# Patient Record
Sex: Female | Born: 1945 | Race: Black or African American | Hispanic: No | Marital: Married | Smoking: Never smoker
Health system: Southern US, Community
[De-identification: ages and names within clinical notes are randomized; demographics above are authoritative.]

## PROBLEM LIST (undated history)

## (undated) DIAGNOSIS — M4800 Spinal stenosis, site unspecified: Secondary | ICD-10-CM

## (undated) DIAGNOSIS — K449 Diaphragmatic hernia without obstruction or gangrene: Secondary | ICD-10-CM

## (undated) DIAGNOSIS — I10 Essential (primary) hypertension: Secondary | ICD-10-CM

## (undated) DIAGNOSIS — E785 Hyperlipidemia, unspecified: Secondary | ICD-10-CM

## (undated) DIAGNOSIS — K5790 Diverticulosis of intestine, part unspecified, without perforation or abscess without bleeding: Secondary | ICD-10-CM

## (undated) HISTORY — DX: Diaphragmatic hernia without obstruction or gangrene: K44.9

## (undated) HISTORY — PX: ABDOMINAL HYSTERECTOMY: SHX81

## (undated) HISTORY — DX: Essential (primary) hypertension: I10

## (undated) HISTORY — DX: Hyperlipidemia, unspecified: E78.5

## (undated) HISTORY — DX: Diverticulosis of intestine, part unspecified, without perforation or abscess without bleeding: K57.90

## (undated) HISTORY — PX: BREAST SURGERY: SHX581

## (undated) HISTORY — PX: LIPOSUCTION: SHX10

---
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
## 1992-02-27 HISTORY — PX: REDUCTION MAMMAPLASTY: SUR839

## 1992-02-27 HISTORY — PX: BREAST REDUCTION SURGERY: SHX8

## 1996-12-01 ENCOUNTER — Encounter: Payer: Self-pay | Admitting: Family Medicine

## 1997-04-05 ENCOUNTER — Ambulatory Visit (HOSPITAL_COMMUNITY): Admission: RE | Admit: 1997-04-05 | Discharge: 1997-04-05 | Payer: Self-pay | Admitting: Family Medicine

## 1998-05-30 ENCOUNTER — Encounter: Payer: Self-pay | Admitting: Family Medicine

## 1998-05-30 ENCOUNTER — Ambulatory Visit (HOSPITAL_COMMUNITY): Admission: RE | Admit: 1998-05-30 | Discharge: 1998-05-30 | Payer: Self-pay | Admitting: Family Medicine

## 1998-12-13 ENCOUNTER — Other Ambulatory Visit: Admission: RE | Admit: 1998-12-13 | Discharge: 1998-12-13 | Payer: Self-pay | Admitting: Obstetrics and Gynecology

## 1999-08-08 ENCOUNTER — Ambulatory Visit (HOSPITAL_COMMUNITY): Admission: RE | Admit: 1999-08-08 | Discharge: 1999-08-08 | Payer: Self-pay | Admitting: Family Medicine

## 1999-08-08 ENCOUNTER — Encounter: Payer: Self-pay | Admitting: Family Medicine

## 2000-01-31 ENCOUNTER — Other Ambulatory Visit: Admission: RE | Admit: 2000-01-31 | Discharge: 2000-01-31 | Payer: Self-pay | Admitting: Obstetrics and Gynecology

## 2001-02-13 ENCOUNTER — Encounter: Payer: Self-pay | Admitting: Family Medicine

## 2001-02-13 ENCOUNTER — Ambulatory Visit (HOSPITAL_COMMUNITY): Admission: RE | Admit: 2001-02-13 | Discharge: 2001-02-13 | Payer: Self-pay | Admitting: Family Medicine

## 2001-02-14 ENCOUNTER — Encounter: Payer: Self-pay | Admitting: Family Medicine

## 2001-06-19 ENCOUNTER — Other Ambulatory Visit: Admission: RE | Admit: 2001-06-19 | Discharge: 2001-06-19 | Payer: Self-pay | Admitting: Obstetrics and Gynecology

## 2001-07-04 ENCOUNTER — Encounter: Payer: Self-pay | Admitting: Family Medicine

## 2001-09-05 ENCOUNTER — Encounter: Payer: Self-pay | Admitting: Family Medicine

## 2002-02-23 ENCOUNTER — Ambulatory Visit (HOSPITAL_COMMUNITY): Admission: RE | Admit: 2002-02-23 | Discharge: 2002-02-23 | Payer: Self-pay | Admitting: Family Medicine

## 2002-02-23 ENCOUNTER — Encounter: Payer: Self-pay | Admitting: Family Medicine

## 2003-01-19 ENCOUNTER — Ambulatory Visit (HOSPITAL_COMMUNITY): Admission: RE | Admit: 2003-01-19 | Discharge: 2003-01-19 | Payer: Self-pay | Admitting: Family Medicine

## 2003-01-25 ENCOUNTER — Other Ambulatory Visit: Admission: RE | Admit: 2003-01-25 | Discharge: 2003-01-25 | Payer: Self-pay | Admitting: Obstetrics and Gynecology

## 2004-01-28 ENCOUNTER — Other Ambulatory Visit: Admission: RE | Admit: 2004-01-28 | Discharge: 2004-01-28 | Payer: Self-pay | Admitting: Obstetrics and Gynecology

## 2004-02-08 ENCOUNTER — Ambulatory Visit (HOSPITAL_COMMUNITY): Admission: RE | Admit: 2004-02-08 | Discharge: 2004-02-08 | Payer: Self-pay | Admitting: Family Medicine

## 2005-02-01 ENCOUNTER — Other Ambulatory Visit: Admission: RE | Admit: 2005-02-01 | Discharge: 2005-02-01 | Payer: Self-pay | Admitting: Obstetrics and Gynecology

## 2005-03-05 ENCOUNTER — Ambulatory Visit (HOSPITAL_COMMUNITY): Admission: RE | Admit: 2005-03-05 | Discharge: 2005-03-05 | Payer: Self-pay | Admitting: Family Medicine

## 2006-03-25 ENCOUNTER — Ambulatory Visit (HOSPITAL_COMMUNITY): Admission: RE | Admit: 2006-03-25 | Discharge: 2006-03-25 | Payer: Self-pay | Admitting: Family Medicine

## 2006-03-25 ENCOUNTER — Other Ambulatory Visit: Admission: RE | Admit: 2006-03-25 | Discharge: 2006-03-25 | Payer: Self-pay | Admitting: Obstetrics and Gynecology

## 2007-03-27 ENCOUNTER — Ambulatory Visit (HOSPITAL_COMMUNITY): Admission: RE | Admit: 2007-03-27 | Discharge: 2007-03-27 | Payer: Self-pay | Admitting: Family Medicine

## 2007-03-27 ENCOUNTER — Other Ambulatory Visit: Admission: RE | Admit: 2007-03-27 | Discharge: 2007-03-27 | Payer: Self-pay | Admitting: Gynecology

## 2007-08-01 ENCOUNTER — Encounter: Payer: Self-pay | Admitting: Family Medicine

## 2008-01-06 ENCOUNTER — Ambulatory Visit: Payer: Self-pay | Admitting: Family Medicine

## 2008-01-06 DIAGNOSIS — E785 Hyperlipidemia, unspecified: Secondary | ICD-10-CM

## 2008-01-06 DIAGNOSIS — I10 Essential (primary) hypertension: Secondary | ICD-10-CM

## 2008-01-07 LAB — CONVERTED CEMR LAB
Alkaline Phosphatase: 99 units/L (ref 39–117)
BUN: 13 mg/dL (ref 6–23)
CO2: 26 meq/L (ref 19–32)
Calcium: 9.7 mg/dL (ref 8.4–10.5)
Creatinine, Ser: 0.98 mg/dL (ref 0.40–1.20)
Glucose, Bld: 101 mg/dL — ABNORMAL HIGH (ref 70–99)
LDL Cholesterol: 118 mg/dL — ABNORMAL HIGH (ref 0–99)
Potassium: 4.6 meq/L (ref 3.5–5.3)
Sodium: 141 meq/L (ref 135–145)
Triglycerides: 131 mg/dL (ref <150)
VLDL: 26 mg/dL (ref 0–40)

## 2008-01-27 ENCOUNTER — Telehealth (INDEPENDENT_AMBULATORY_CARE_PROVIDER_SITE_OTHER): Payer: Self-pay | Admitting: *Deleted

## 2008-04-12 ENCOUNTER — Ambulatory Visit (HOSPITAL_COMMUNITY): Admission: RE | Admit: 2008-04-12 | Discharge: 2008-04-12 | Payer: Self-pay | Admitting: Family Medicine

## 2008-04-12 ENCOUNTER — Encounter: Payer: Self-pay | Admitting: Women's Health

## 2008-04-12 ENCOUNTER — Other Ambulatory Visit: Admission: RE | Admit: 2008-04-12 | Discharge: 2008-04-12 | Payer: Self-pay | Admitting: Obstetrics and Gynecology

## 2008-04-12 ENCOUNTER — Ambulatory Visit: Payer: Self-pay | Admitting: Women's Health

## 2008-04-23 ENCOUNTER — Ambulatory Visit: Payer: Self-pay | Admitting: Obstetrics and Gynecology

## 2008-06-16 ENCOUNTER — Telehealth: Payer: Self-pay | Admitting: Family Medicine

## 2008-06-21 ENCOUNTER — Ambulatory Visit: Payer: Self-pay | Admitting: Family Medicine

## 2008-07-19 ENCOUNTER — Ambulatory Visit: Payer: Self-pay | Admitting: Family Medicine

## 2008-08-19 ENCOUNTER — Ambulatory Visit: Payer: Self-pay | Admitting: Family Medicine

## 2008-08-20 LAB — CONVERTED CEMR LAB
CO2: 27 meq/L (ref 19–32)
Calcium: 9.6 mg/dL (ref 8.4–10.5)
Creatinine, Ser: 1.04 mg/dL (ref 0.40–1.20)
Glucose, Bld: 108 mg/dL — ABNORMAL HIGH (ref 70–99)

## 2008-09-10 ENCOUNTER — Ambulatory Visit: Payer: Self-pay | Admitting: Family Medicine

## 2008-11-18 ENCOUNTER — Ambulatory Visit: Payer: Self-pay | Admitting: Family Medicine

## 2008-11-25 ENCOUNTER — Encounter: Payer: Self-pay | Admitting: Family Medicine

## 2009-03-21 ENCOUNTER — Ambulatory Visit: Payer: Self-pay | Admitting: Family Medicine

## 2009-03-21 LAB — CONVERTED CEMR LAB
ALT: 17 units/L (ref 0–35)
AST: 25 units/L (ref 0–37)
Albumin: 4.8 g/dL (ref 3.5–5.2)
BUN: 15 mg/dL (ref 6–23)
CO2: 30 meq/L (ref 19–32)
Glucose, Bld: 103 mg/dL — ABNORMAL HIGH (ref 70–99)
LDL Cholesterol: 109 mg/dL — ABNORMAL HIGH (ref 0–99)
Total Protein: 7.7 g/dL (ref 6.0–8.3)
Triglycerides: 148 mg/dL (ref <150)
VLDL: 30 mg/dL (ref 0–40)

## 2009-04-13 ENCOUNTER — Other Ambulatory Visit: Admission: RE | Admit: 2009-04-13 | Discharge: 2009-04-13 | Payer: Self-pay | Admitting: Obstetrics and Gynecology

## 2009-04-13 ENCOUNTER — Ambulatory Visit: Payer: Self-pay | Admitting: Women's Health

## 2009-04-13 ENCOUNTER — Ambulatory Visit (HOSPITAL_COMMUNITY): Admission: RE | Admit: 2009-04-13 | Discharge: 2009-04-13 | Payer: Self-pay | Admitting: Family Medicine

## 2009-09-13 ENCOUNTER — Telehealth: Payer: Self-pay | Admitting: Family

## 2009-09-13 ENCOUNTER — Ambulatory Visit: Payer: Self-pay | Admitting: Family

## 2009-09-13 ENCOUNTER — Encounter: Admission: RE | Admit: 2009-09-13 | Discharge: 2009-09-13 | Payer: Self-pay | Admitting: Family Medicine

## 2009-09-13 DIAGNOSIS — M25569 Pain in unspecified knee: Secondary | ICD-10-CM

## 2009-09-29 ENCOUNTER — Encounter: Payer: Self-pay | Admitting: Family Medicine

## 2010-03-16 ENCOUNTER — Other Ambulatory Visit (HOSPITAL_COMMUNITY): Payer: Self-pay | Admitting: *Deleted

## 2010-03-16 ENCOUNTER — Other Ambulatory Visit: Payer: Self-pay | Admitting: Family Medicine

## 2010-03-16 DIAGNOSIS — Z1231 Encounter for screening mammogram for malignant neoplasm of breast: Secondary | ICD-10-CM

## 2010-03-30 NOTE — Assessment & Plan Note (Signed)
Summary: FU htn, lipids   Vital Signs:  Patient profile:   65 year old female Height:      70 inches Weight:      205 pounds Pulse rate:   90 / minute BP sitting:   128 / 72  (left arm) Cuff size:   large  Vitals Entered By: Kathlene November (March 21, 2009 8:32 AM) CC: follow-up BP, Hypertension Management   Primary Care Provider:  Nani Gasser MD  CC:  follow-up BP and Hypertension Management.  History of Present Illness: follow-up BP.   Hypertension History:      She denies headache, chest pain, palpitations, dyspnea with exertion, orthopnea, PND, peripheral edema, visual symptoms, neurologic problems, syncope, and side effects from treatment.  She notes no problems with any antihypertensive medication side effects.        Positive major cardiovascular risk factors include female age 75 years old or older, hyperlipidemia, hypertension, and family history for ischemic heart disease (males less than 38 years old).  Negative major cardiovascular risk factors include non-tobacco-user status.     Current Medications (verified): 1)  Lisinopril-Hydrochlorothiazide 20-12.5 Mg Tabs (Lisinopril-Hydrochlorothiazide) .... Take 1 Tablet By Mouth Once A Day 2)  Amlodipine Besylate 5 Mg Tabs (Amlodipine Besylate) .... Take One Tablet By Mouth Once A Day 3)  Calcium + D 600-200 Mg-Unit Tabs (Calcium Carbonate-Vitamin D) .... Take One Tablet By Mouth Once A Day 4)  Fish Oil Concentrate 1000 Mg Caps (Omega-3 Fatty Acids) .... Take One Capsule By Mouth Once A Day 5)  Vitamin D 400 Unit Caps (Cholecalciferol) .... Take One Tablet By Mouth Once A Adya 6)  Aspir-81 81 Mg Tbec (Aspirin) .... Take One Tablet By Mouth Once A D Ay  Allergies (verified): 1)  ! Pcn  Comments:  Nurse/Medical Assistant: The patient's medications and allergies were reviewed with the patient and were updated in the Medication and Allergy Lists. Kathlene November (March 21, 2009 8:32 AM)  Physical Exam  General:   Well-developed,well-nourished,in no acute distress; alert,appropriate and cooperative throughout examination Head:  Normocephalic and atraumatic without obvious abnormalities. No apparent alopecia or balding. Eyes:  No corneal or conjunctival inflammation noted. EOMI. Perrla. Funduscopic exam benign, without hemorrhages, exudates or papilledema. Vision grossly normal. Lungs:  Normal respiratory effort, chest expands symmetrically. Lungs are clear to auscultation, no crackles or wheezes. Heart:  Normal rate and regular rhythm. S1 and S2 normal without gallop, murmur, click, rub or other extra sounds. Skin:  no rashes.   Cervical Nodes:  No lymphadenopathy noted Psych:  Cognition and judgment appear intact. Alert and cooperative with normal attention span and concentration. No apparent delusions, illusions, hallucinations   Impression & Recommendations:  Problem # 1:  HYPERTENSION, BENIGN (ICD-401.1) Looks great. FU in 6 mo.  Her updated medication list for this problem includes:    Lisinopril-hydrochlorothiazide 20-12.5 Mg Tabs (Lisinopril-hydrochlorothiazide) .Marland Kitchen... Take 1 tablet by mouth once a day    Amlodipine Besylate 5 Mg Tabs (Amlodipine besylate) .Marland Kitchen... Take one tablet by mouth once a day  Orders: T-Comprehensive Metabolic Panel ([id number])  BP today: 128/72 Prior BP: 132/75 (11/18/2008)  Prior 10 Yr Risk Heart Disease: 9 % (08/19/2008)  Labs Reviewed: K+: 4.1 (08/19/2008) Creat: : 1.04 (08/19/2008)   Chol: 196 (01/06/2008)   HDL: 52 (01/06/2008)   LDL: 118 (01/06/2008)   TG: 131 (01/06/2008)  Problem # 2:  HYPERLIPIDEMIA (ICD-272.4) Due to recheck today.   Orders: T-Comprehensive Metabolic Panel [phone]) T-Lipid Profile [phone])  Complete Medication List: 1)  Lisinopril-hydrochlorothiazide 20-12.5 Mg Tabs (Lisinopril-hydrochlorothiazide) .... Take 1 tablet by mouth once a day 2)  Amlodipine Besylate 5 Mg Tabs (Amlodipine besylate) .... Take one tablet by mouth  once a day 3)  Calcium + D 600-200 Mg-unit Tabs (Calcium carbonate-vitamin d) .... Take one tablet by mouth once a day 4)  Fish Oil Concentrate 1000 Mg Caps (Omega-3 fatty acids) .... Take one capsule by mouth once a day 5)  Vitamin D 400 Unit Caps (Cholecalciferol) .... Take one tablet by mouth once a adya 6)  Aspir-81 81 Mg Tbec (Aspirin) .... Take one tablet by mouth once a d ay  Hypertension Assessment/Plan:      The patient's hypertensive risk group is category B: At least one risk factor (excluding diabetes) with no target organ damage.  Her calculated 10 year risk of coronary heart disease is 9 %.  Today's blood pressure is 128/72.  Her blood pressure goal is < 140/90.  Contraindications/Deferment of Procedures/Staging:    Test/Procedure: FLU VAX    Reason for deferment: patient declined

## 2010-03-30 NOTE — Progress Notes (Signed)
  Phone Note Outgoing Call   Summary of Call: Please call patient and let her know that her knee x-ray shows a small effusion (fluid) and arthritis.  I will arrange referral to orthopedics for further evaluation. Initial call taken by: Lemont Fillers FNP,  September 13, 2009 5:08 PM  Follow-up for Phone Call        [facility] for pt to rerturn call Follow-up by: Kathlene November,  September 14, 2009 8:11 AM  Additional Follow-up for Phone Call Additional follow up Details #1::        Pt informed of results and instructed a referral will be done Additional Follow-up by: Kathlene November,  September 14, 2009 12:02 PM

## 2010-03-30 NOTE — Consult Note (Signed)
Summary: [facility]  [facility]   Imported By: Lanelle Bal 10/11/2009 13:50:20  _____________________________________________________________________  External Attachment:    Type:   Image     Comment:   External Document

## 2010-03-30 NOTE — Assessment & Plan Note (Signed)
Summary: Rt. leg hurts - jr   Vital Signs:  Patient profile:   65 year old female Height:      70 inches Weight:      211 pounds Pulse rate:   86 / minute BP sitting:   136 / 74  (left arm) Cuff size:   regular  Vitals Entered By: Kathlene November (September 13, 2009 2:44 PM) CC: right posterior knee pain x few months   Primary Care Provider:  Nani Gasser MD  CC:  right posterior knee pain x few months.  History of Present Illness: Ms Planck is a 65 year old female who presents with a several month history of pain behind her left knee.  Worse with walking down steps.  Occasionally takes aleve with minimal improvement.  Denies fevers.  Denies known history of arthritis or injury to the left knee.  Denies shortness of breath, chest pain, or pain in the right calf.    Current Medications (verified): 1)  Lisinopril-Hydrochlorothiazide 20-12.5 Mg Tabs (Lisinopril-Hydrochlorothiazide) .... Take 1 Tablet By Mouth Once A Day 2)  Amlodipine Besylate 5 Mg Tabs (Amlodipine Besylate) .... Take One Tablet By Mouth Once A Day 3)  Calcium + D 600-200 Mg-Unit Tabs (Calcium Carbonate-Vitamin D) .... Take One Tablet By Mouth Once A Day 4)  Fish Oil Concentrate 1000 Mg Caps (Omega-3 Fatty Acids) .... Take One Capsule By Mouth Once A Day 5)  Vitamin D 400 Unit Caps (Cholecalciferol) .... Take One Tablet By Mouth Once A Adya 6)  Aspir-81 81 Mg Tbec (Aspirin) .... Take One Tablet By Mouth Once A D Ay  Allergies (verified): 1)  ! Pcn  Comments:  Nurse/Medical Assistant: The patient's medications and allergies were reviewed with the patient and were updated in the Medication and Allergy Lists. Kathlene November (September 13, 2009 2:45 PM)  Past History:  Past Medical History: Last updated: 08/19/2008 HTN since before 2000 Cholesterol.  Hiatal Hernia, GERD Diverticulosis Exercise Tolerance Test  06/2001. minimally abnormal Prior use of HRT Shingles 1993  Has gyn who does pap and mammogram  Past  Surgical History: Last updated: 01/06/2008 Complete Hysterectomy 1976 Breast Reduction 1994  Physical Exam  General:  Well-developed,well-nourished,in no acute distress; alert,appropriate and cooperative throughout examination Msk:  + tenderness to palpation behind right knee.  Unable to fully extend right knee.  No knee swelling noted.   Impression & Recommendations:  Problem # 1:  KNEE PAIN, RIGHT (ICD-719.46) Assessment New Knee x-ray performed today noted +OA and small joint effusion.  Will refer to orthopedics for further evaluation.  In the meantime, patient was instructed to use ibuprofen as needed. Her updated medication list for this problem includes:    Aspir-81 81 Mg Tbec (Aspirin) .Marland Kitchen... Take one tablet by mouth once a d ay  Orders: T-DG Knee Complete 4 Views*R* ([id number])  Complete Medication List: 1)  Lisinopril-hydrochlorothiazide 20-12.5 Mg Tabs (Lisinopril-hydrochlorothiazide) .... Take 1 tablet by mouth once a day 2)  Amlodipine Besylate 5 Mg Tabs (Amlodipine besylate) .... Take one tablet by mouth once a day 3)  Calcium + D 600-200 Mg-unit Tabs (Calcium carbonate-vitamin d) .... Take one tablet by mouth once a day 4)  Fish Oil Concentrate 1000 Mg Caps (Omega-3 fatty acids) .... Take one capsule by mouth once a day 5)  Vitamin D 400 Unit Caps (Cholecalciferol) .... Take one tablet by mouth once a adya 6)  Aspir-81 81 Mg Tbec (Aspirin) .... Take one tablet by mouth once a d ay  Patient Instructions:  1)  Please complete your knee x-ray downstairs. 2)  Follow up with Dr. Linford Arnold in 1 month.

## 2010-04-14 ENCOUNTER — Ambulatory Visit (HOSPITAL_COMMUNITY)
Admission: RE | Admit: 2010-04-14 | Discharge: 2010-04-14 | Disposition: A | Discharge status: Home / Self Care | Payer: 59 | Source: Ambulatory Visit | Attending: Family Medicine | Admitting: Family Medicine

## 2010-04-14 ENCOUNTER — Encounter (HOSPITAL_COMMUNITY): Payer: Self-pay

## 2010-04-14 ENCOUNTER — Other Ambulatory Visit (HOSPITAL_COMMUNITY)
Admission: RE | Admit: 2010-04-14 | Discharge: 2010-04-14 | Disposition: A | Discharge status: Home / Self Care | Payer: 59 | Source: Ambulatory Visit | Attending: Obstetrics and Gynecology | Admitting: Obstetrics and Gynecology

## 2010-04-14 ENCOUNTER — Encounter (INDEPENDENT_AMBULATORY_CARE_PROVIDER_SITE_OTHER): Payer: 59 | Admitting: Women's Health

## 2010-04-14 ENCOUNTER — Other Ambulatory Visit: Payer: Self-pay | Admitting: Women's Health

## 2010-04-14 DIAGNOSIS — Z1231 Encounter for screening mammogram for malignant neoplasm of breast: Secondary | ICD-10-CM | POA: Insufficient documentation

## 2010-04-14 DIAGNOSIS — Z01419 Encounter for gynecological examination (general) (routine) without abnormal findings: Secondary | ICD-10-CM

## 2010-04-14 DIAGNOSIS — Z124 Encounter for screening for malignant neoplasm of cervix: Secondary | ICD-10-CM | POA: Insufficient documentation

## 2010-04-25 ENCOUNTER — Encounter (INDEPENDENT_AMBULATORY_CARE_PROVIDER_SITE_OTHER): Payer: 59

## 2010-04-25 DIAGNOSIS — Z13820 Encounter for screening for osteoporosis: Secondary | ICD-10-CM

## 2010-09-13 ENCOUNTER — Other Ambulatory Visit: Payer: Self-pay | Admitting: Family Medicine

## 2010-10-13 ENCOUNTER — Other Ambulatory Visit: Payer: Self-pay | Admitting: Family Medicine

## 2011-01-07 ENCOUNTER — Other Ambulatory Visit: Payer: Self-pay | Admitting: Family Medicine

## 2011-01-13 ENCOUNTER — Other Ambulatory Visit: Payer: Self-pay | Admitting: Family Medicine

## 2011-01-15 NOTE — Telephone Encounter (Signed)
Needs appointment

## 2011-01-22 ENCOUNTER — Encounter: Payer: Self-pay | Admitting: Family Medicine

## 2011-01-22 ENCOUNTER — Ambulatory Visit (INDEPENDENT_AMBULATORY_CARE_PROVIDER_SITE_OTHER): Payer: 59 | Admitting: Family Medicine

## 2011-01-22 VITALS — BP 127/66 | HR 78 | Wt 213.0 lb

## 2011-01-22 DIAGNOSIS — R252 Cramp and spasm: Secondary | ICD-10-CM

## 2011-01-22 DIAGNOSIS — I10 Essential (primary) hypertension: Secondary | ICD-10-CM

## 2011-01-22 MED ORDER — LISINOPRIL-HYDROCHLOROTHIAZIDE 20-12.5 MG PO TABS: 1.0000 | ORAL_TABLET | Freq: Every day | ORAL | Status: DC

## 2011-01-22 MED ORDER — AMLODIPINE BESYLATE 5 MG PO TABS: 5.0000 mg | ORAL_TABLET | Freq: Every day | ORAL | Status: DC

## 2011-01-22 NOTE — Patient Instructions (Signed)
Schedule your physical anytime.  Make sure to fast for 8 hours before your labwork.

## 2011-01-22 NOTE — Progress Notes (Signed)
  Subjective:    Patient ID: [name], female    DOB: 1945-07-16, 65 y.o.   MRN: [id number]  Hypertension This is a chronic problem. The current episode started more than 1 year ago. The problem is unchanged. The problem is controlled. Pertinent negatives include no chest pain, palpitations or shortness of breath. Past treatments include calcium channel blockers, ACE inhibitors and diuretics. The current treatment provides moderate improvement. Compliance problems include exercise and diet.     Review of Systems  Respiratory: Negative for shortness of breath.   Cardiovascular: Negative for chest pain and palpitations.       Objective:   Physical Exam  Constitutional: She is oriented to person, place, and time. She appears well-developed and well-nourished.  HENT:  Head: Normocephalic and atraumatic.  Cardiovascular: Normal rate, regular rhythm and normal heart sounds.        2/ 6 SEM   Pulmonary/Chest: Effort normal and breath sounds normal.  Neurological: She is alert and oriented to person, place, and time.  Skin: Skin is warm and dry.  Psychiatric: She has a normal mood and affect. Her behavior is normal.          Assessment & Plan:  HTN- Encouraged her to really work on exercise and diet.  BP is well controlled.  F/U in 6 mo. Due for CMP and lipids. Lab slip given.   Leg cramps - also c/o leg cramping.. Hasn't had labs in over a year. Start otc magnesium and helped some. Sometimes will cramp so bad she is sore the next day. Rec check electrolytes, esp with the meds she takes.

## 2011-01-23 LAB — LIPID PANEL
Total CHOL/HDL Ratio: 4.2 Ratio
Triglycerides: 211 mg/dL — ABNORMAL HIGH (ref <150)

## 2011-01-23 MED ORDER — AMLODIPINE BESYLATE 5 MG PO TABS: 5.0000 mg | ORAL_TABLET | Freq: Every day | ORAL | Status: DC

## 2011-01-23 MED ORDER — LISINOPRIL-HYDROCHLOROTHIAZIDE 20-12.5 MG PO TABS: 1.0000 | ORAL_TABLET | Freq: Every day | ORAL | Status: DC

## 2011-01-23 NOTE — Progress Notes (Signed)
Addended by: Ellsworth Lennox on: 01/23/2011 04:47 PM   Modules accepted: Orders

## 2011-01-24 ENCOUNTER — Telehealth: Payer: Self-pay | Admitting: Family Medicine

## 2011-01-24 LAB — COMPLETE METABOLIC PANEL WITH GFR
AST: 31 U/L (ref 0–37)
Alkaline Phosphatase: 83 U/L (ref 39–117)
BUN: 18 mg/dL (ref 6–23)
CO2: 29 mEq/L (ref 19–32)
Calcium: 9.5 mg/dL (ref 8.4–10.5)
Creat: 1.02 mg/dL (ref 0.50–1.10)
Glucose, Bld: 100 mg/dL — ABNORMAL HIGH (ref 70–99)
Sodium: 141 mEq/L (ref 135–145)
Total Bilirubin: 0.5 mg/dL (ref 0.3–1.2)

## 2011-01-24 NOTE — Telephone Encounter (Signed)
Called rx in  

## 2011-01-24 NOTE — Telephone Encounter (Signed)
Patient's pharmacy called advised per pt's insurance she needs 90 day supply of amlodipine and linsopril

## 2011-01-25 ENCOUNTER — Other Ambulatory Visit: Payer: Self-pay | Admitting: *Deleted

## 2011-01-26 ENCOUNTER — Emergency Department
Admit: 2011-01-26 | Discharge: 2011-01-26 | Disposition: A | Discharge status: Home / Self Care | Payer: 59 | Attending: Emergency Medicine | Admitting: Emergency Medicine

## 2011-01-26 ENCOUNTER — Emergency Department
Admission: EM | Admit: 2011-01-26 | Discharge: 2011-01-26 | Disposition: A | Discharge status: Home / Self Care | Payer: 59 | Source: Home / Self Care | Attending: Emergency Medicine | Admitting: Emergency Medicine

## 2011-01-26 ENCOUNTER — Encounter: Payer: Self-pay | Admitting: *Deleted

## 2011-01-26 DIAGNOSIS — M79609 Pain in unspecified limb: Secondary | ICD-10-CM

## 2011-01-26 DIAGNOSIS — S93409A Sprain of unspecified ligament of unspecified ankle, initial encounter: Secondary | ICD-10-CM

## 2011-01-26 DIAGNOSIS — M79671 Pain in right foot: Secondary | ICD-10-CM

## 2011-01-26 NOTE — ED Notes (Signed)
Patient c/o right foot pain on top of her foot. Unknown cause of pain.

## 2011-01-26 NOTE — ED Provider Notes (Signed)
History     CSN: [id number] Arrival date & time: 01/26/2011 11:32 AM   First MD Initiated Contact with Patient 01/26/11 1152      Chief Complaint  Patient presents with  . Foot Pain    top of right    (Consider location/radiation/quality/duration/timing/severity/associated sxs/prior treatment) HPI This patient comes in today with right foot pain for the last few weeks. She does not recall any kind of trauma, tripping, or twisting her ankle. She knows that the pain is mostly on the lateral aspect of the foot. She thinks that it feels a little bit swollen or that there is a bump there. She has not been eating much medicine, but when she does she takes Aleve which does help. No previous injuries or fractures on that foot. The pain is described as throbbing and intermittent and has an unknown cause.  Past Medical History  Diagnosis Date  . Hypertension   . Hyperlipidemia   . GERD (gastroesophageal reflux disease)   . Hiatal hernia   . Diverticulosis     Past Surgical History  Procedure Date  . Complete hysterectomy 1976  . Breast reduction surgery 1994    Family History  Problem Relation Age of Onset  . Breast cancer Sister   . Heart attack Father     History  Substance Use Topics  . Smoking status: Never Smoker   . Smokeless tobacco: Not on file  . Alcohol Use: No    OB History    Grav Para Term Preterm Abortions TAB SAB Ect Mult Living                  Review of Systems  Allergies  Penicillins  Home Medications   Current Outpatient Rx  Name Route Sig Dispense Refill  . AMLODIPINE BESYLATE 5 MG PO TABS Oral Take 1 tablet (5 mg total) by mouth daily. 90 tablet 1  . AMLODIPINE BESYLATE 5 MG PO TABS Oral Take 1 tablet (5 mg total) by mouth daily. 30 tablet 0  . ASPIRIN 81 MG PO TABS Oral Take 81 mg by mouth daily.      Marland Kitchen CALCIUM CARBONATE-VITAMIN D 600-200 MG-UNIT PO TABS Oral Take by mouth.      . CHOLECALCIFEROL 400 UNITS PO TABS Oral Take by mouth.        Marland Kitchen LISINOPRIL-HYDROCHLOROTHIAZIDE 20-12.5 MG PO TABS Oral Take 1 tablet by mouth daily. 90 tablet 1  . LISINOPRIL-HYDROCHLOROTHIAZIDE 20-12.5 MG PO TABS Oral Take 1 tablet by mouth daily. 30 tablet 0  . FISH OIL 1000 MG PO CPDR Oral Take by mouth.        BP 112/67  Pulse 82  Temp(Src) 98.1 F (36.7 C) (Oral)  Resp 14  Ht 5' 10.5" (1.791 m)  Wt 211 lb (95.709 kg)  BMI 29.85 kg/m2  SpO2 100%  Physical Exam  Nursing note and vitals reviewed. Constitutional: She is oriented to person, place, and time. She appears well-developed and well-nourished.  HENT:  Head: Normocephalic and atraumatic.  Neck: Neck supple.  Cardiovascular: Regular rhythm and normal heart sounds.   Pulmonary/Chest: Effort normal and breath sounds normal. No respiratory distress.  Musculoskeletal:       R ankle/foot: FROM, +TTP, base of fifth, ATFL.   No TTP medial malleolus, navicular, calcaneus, Achilles, proximal fibula.  No swelling.  No ecchymoses.  Distal neurovascular status is intact.   Neurological: She is alert and oriented to person, place, and time.  Skin: Skin is warm and dry.  Psychiatric: She has a normal mood and affect. Her speech is normal.    ED Course  Procedures (including critical care time)  Labs Reviewed - No data to display Dg Foot Complete Right  01/26/2011  *RADIOLOGY REPORT*  Clinical Data: Right foot pain.  RIGHT FOOT COMPLETE - 3+ VIEW  Comparison: None.  Findings: There is a hallux valgus deformity of the right foot. Soft tissue swelling is noted medial to the first MTP joint. Calcaneal spurring is noted.  No acute bone or soft tissue abnormality is present.  IMPRESSION:  1.  Hallux valgus deformity. 2.  Calcaneal spurring. 3.  No acute abnormality.  Original Report Authenticated By: Jamesetta Orleans. MATTERN, M.D.     1. Right foot pain   2. Ankle sprain       MDM   An X-ray was ordered and read by the radiologist as written above.   Encourage rest, ice, compression with  ACE bandage and/or a brace, and elevation of injured body part.  If she is not improving by next week, I would like to refer her to sports medicine.       Lily Kocher, MD 01/26/11 1225

## 2011-01-30 ENCOUNTER — Ambulatory Visit (INDEPENDENT_AMBULATORY_CARE_PROVIDER_SITE_OTHER): Payer: 59 | Admitting: Family Medicine

## 2011-01-30 ENCOUNTER — Encounter: Payer: Self-pay | Admitting: Family Medicine

## 2011-01-30 VITALS — BP 137/74 | HR 77 | Wt 213.0 lb

## 2011-01-30 DIAGNOSIS — Z23 Encounter for immunization: Secondary | ICD-10-CM

## 2011-01-30 DIAGNOSIS — R252 Cramp and spasm: Secondary | ICD-10-CM

## 2011-01-30 DIAGNOSIS — E785 Hyperlipidemia, unspecified: Secondary | ICD-10-CM

## 2011-01-30 MED ORDER — AMBULATORY NON FORMULARY MEDICATION: Status: DC

## 2011-01-30 NOTE — Progress Notes (Signed)
  Subjective:    Patient ID: [name], female    DOB: June 20, 1945, 65 y.o.   MRN: [id number]  HPI Leg cramping -  We went over her labs today. She hasn't had any more cramping since i saw her. She did start a walking program last week. She really doesn't know making some lifestyle changes. She is here to go over her lab results.   Review of Systems     Objective:   Physical Exam  Constitutional: She is oriented to person, place, and time. She appears well-developed and well-nourished.  HENT:  Head: Normocephalic and atraumatic.  Cardiovascular: Normal rate, regular rhythm and normal heart sounds.   Pulmonary/Chest: Effort normal and breath sounds normal.  Neurological: She is alert and oriented to person, place, and time.  Skin: Skin is warm and dry.  Psychiatric: She has a normal mood and affect. Her behavior is normal.          Assessment & Plan:  Hyperlipidemia - Work on diet and exercise, weight loss and get levels back to previous levels. She has gained weight.  She plans on working on this. We reviewed a low cholesterol diet.   Leg cramping. Discussed medication for cramping. She can call if she would like to strt this. Since hasn't had any new cramping will hold off.   unforntunately we had discussed pnuemonia vaccine and flu and she agreed but we forget to give them before she left. Will will admin today.

## 2011-02-16 ENCOUNTER — Telehealth: Payer: Self-pay | Admitting: *Deleted

## 2011-02-16 NOTE — Telephone Encounter (Signed)
Pt called and states she woke up with a rash on her neck that itches. No new perfumes,detergents,ect.Advised pt to try otc cortisone cream and take claritin reditabs that dissolve to see if that helps with rash and itching.Pt agreed and voiced understanding

## 2011-03-05 ENCOUNTER — Other Ambulatory Visit: Payer: Self-pay | Admitting: Family Medicine

## 2011-03-05 DIAGNOSIS — Z1231 Encounter for screening mammogram for malignant neoplasm of breast: Secondary | ICD-10-CM

## 2011-04-16 ENCOUNTER — Ambulatory Visit (HOSPITAL_COMMUNITY)
Admission: RE | Admit: 2011-04-16 | Discharge: 2011-04-16 | Disposition: A | Discharge status: Home / Self Care | Payer: 59 | Source: Ambulatory Visit | Attending: Family Medicine | Admitting: Family Medicine

## 2011-04-16 ENCOUNTER — Encounter: Payer: Self-pay | Admitting: Women's Health

## 2011-04-16 ENCOUNTER — Ambulatory Visit (INDEPENDENT_AMBULATORY_CARE_PROVIDER_SITE_OTHER): Payer: 59 | Admitting: Women's Health

## 2011-04-16 VITALS — BP 138/86 | Ht 70.0 in | Wt 215.0 lb

## 2011-04-16 DIAGNOSIS — Z780 Asymptomatic menopausal state: Secondary | ICD-10-CM

## 2011-04-16 DIAGNOSIS — Z1231 Encounter for screening mammogram for malignant neoplasm of breast: Secondary | ICD-10-CM | POA: Insufficient documentation

## 2011-04-16 NOTE — Progress Notes (Signed)
[name] [birth year] [id number]    History:    The patient presents for breast and pelvic exam. History of a TAH for fibroids, no ERT, without complaint. History of normal mammograms and Pap smears. Normal DEXA in 2009. We'll repeat study this year.  Past medical history, past surgical history, family history and social history were all reviewed and documented in the EPIC chart. Normal colonoscopy in 2003, is aware she is due for a repeat study this year. Has had the Pneumovax vaccine and will check with primary care if she has received zostovac and Dtap. 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   ROS:  A  ROS was performed and pertinent positives and negatives are included in the history.  Exam:  Filed Vitals:   04/16/11 0906  BP: 138/86    General appearance:  Normal Head/Neck:  Normal, without cervical or supraclavicular adenopathy. Thyroid:  Symmetrical, normal in size, without palpable masses or nodularity. Respiratory  Effort:  Normal  Auscultation:  Clear without wheezing or rhonchi Cardiovascular  Auscultation:  Regular rate, without rubs, murmurs or gallops  Edema/varicosities:  Not grossly evident Abdominal  Soft,nontender, without masses, guarding or rebound.  Liver/spleen:  No organomegaly noted  Hernia:  None appreciated  Skin  Inspection:  Grossly normal  Palpation:  Grossly normal Neurologic/psychiatric  Orientation:  Normal with appropriate conversation.  Mood/affect:  Normal  Genitourinary    Breasts: Examined lying and sitting.     Right: Without masses, retractions, discharge or axillary adenopathy.     Left: Without masses, retractions, discharge or axillary adenopathy.   Inguinal/mons:  Normal without inguinal adenopathy  External genitalia:  Normal  BUS/Urethra/Skene's glands:  Normal  Bladder:  Normal  Vagina:  +1 rectocele/asymptomatic   Cervix:  Absent  Uterus:    Adnexa/parametria:     Rt: Without masses or tenderness.   Lt: Without masses or tenderness.  Anus and perineum: Normal  Digital rectal exam: Normal sphincter tone without palpated masses or tenderness  Assessment/Plan:  66 y.o. MBF G0 for breast and pelvic exam.   Normal postmenopausal exam Asymptomatic +1 rectocele Hypertension-primary care labs and meds.  Plan: Continue SBE's and annual  mammogram, Mammogram scheduled today. Reviewed importance of cutting calories increasing exercise for weight loss. Has had approximately 25 pound weight gain in the past year, she relates to situational stressors. Vitamin D 1000 daily, calcium rich diet, increasingly short activities encouraged. Scheduled Dexa.  Continue care with primary care and schedule a colonoscopy this year.    [name], [name] [facility], 9:44 AM 04/16/2011

## 2011-05-01 ENCOUNTER — Other Ambulatory Visit: Payer: Self-pay | Admitting: Obstetrics and Gynecology

## 2011-05-01 DIAGNOSIS — Z13820 Encounter for screening for osteoporosis: Secondary | ICD-10-CM

## 2011-05-02 ENCOUNTER — Ambulatory Visit (INDEPENDENT_AMBULATORY_CARE_PROVIDER_SITE_OTHER): Payer: 59

## 2011-05-02 DIAGNOSIS — Z13820 Encounter for screening for osteoporosis: Secondary | ICD-10-CM

## 2011-07-24 ENCOUNTER — Ambulatory Visit (INDEPENDENT_AMBULATORY_CARE_PROVIDER_SITE_OTHER): Payer: 59 | Admitting: Family Medicine

## 2011-07-24 ENCOUNTER — Encounter: Payer: Self-pay | Admitting: Family Medicine

## 2011-07-24 VITALS — BP 150/73 | HR 93 | Ht 70.0 in | Wt 215.0 lb

## 2011-07-24 DIAGNOSIS — L259 Unspecified contact dermatitis, unspecified cause: Secondary | ICD-10-CM

## 2011-07-24 MED ORDER — PREDNISONE (PAK) 10 MG PO TABS: 10.0000 mg | ORAL_TABLET | Freq: Every day | ORAL | Status: AC

## 2011-07-24 MED ORDER — SODIUM CHLORIDE 0.9 % IV SOLN: 125.0000 mg | Freq: Once | INTRAVENOUS | Status: DC

## 2011-07-24 MED ORDER — RANITIDINE HCL 150 MG PO TABS: 150.0000 mg | ORAL_TABLET | Freq: Two times a day (BID) | ORAL | Status: DC

## 2011-07-24 MED ORDER — LORATADINE 10 MG PO TABS: 10.0000 mg | ORAL_TABLET | Freq: Every day | ORAL | Status: DC

## 2011-07-24 NOTE — Patient Instructions (Signed)

## 2011-07-24 NOTE — Progress Notes (Signed)
  Subjective:    Patient ID: [name], female    DOB: December 04, 1945, 66 y.o.   MRN: [id number]  Rash This is a new problem. The current episode started in the past 7 days (Rash started 4 days go). The problem has been gradually worsening since onset. The affected locations include the right arm, left arm and neck. The rash is characterized by burning, itchiness and redness. She was exposed to nothing. Associated symptoms include eye pain. Pertinent negatives include no anorexia, congestion, cough, diarrhea, facial edema, fatigue, fever, joint pain, nail changes, rhinorrhea, shortness of breath, sore throat or vomiting. Past treatments include antihistamine and topical steroids. The treatment provided mild relief. Her past medical history is significant for allergies and varicella. There is no history of asthma or eczema.   Review of Systems  Constitutional: Negative for fever and fatigue.  HENT: Negative for congestion, sore throat and rhinorrhea.   Eyes: Positive for pain.  Respiratory: Negative for cough and shortness of breath.   Gastrointestinal: Negative for vomiting, diarrhea and anorexia.  Musculoskeletal: Negative for joint pain.  Skin: Positive for rash. Negative for nail changes.  Neurological: Negative for dizziness and facial asymmetry.  All other systems reviewed and are negative.       BP 150/73  Pulse 93  Ht 5\' 10"  (1.778 m)  Wt 215 lb (97.523 kg)  BMI 30.85 kg/m2  SpO2 100% Objective:   Physical Exam  Constitutional: She is oriented to person, place, and time. She appears well-developed and well-nourished.  Pulmonary/Chest: She has no wheezes.  Neurological: She is alert and oriented to person, place, and time.  Skin: Rash noted. There is erythema.       Rash is present on both arms and his marked hyperemia as well on both arms and is similar rash on the neck and upper part of chest as well.  Psychiatric: She has a normal mood and affect. Her behavior is normal.      Assessment & Plan:  contact dermatitis. The etiology is not clear but I question if it was oil that may have been present on her husband's clothes since he is the one who works in the yard and outside. We will give her a shot of Solu-Medrol 125 in the office , prednisone dosepak taper over the next 12 days warned about stopping prematurely and having rebound symptoms. Instead of OTC Benadryl which could cause sedation I will recommend Claritin 10 mg daily and Zantac 150 mg twice a day. Return if not better in 2 weeks.   Problem 2 blood pressure mildly elevated but maybe because of the pruritus. We'll not make any changes at this time.

## 2011-07-27 ENCOUNTER — Other Ambulatory Visit: Payer: Self-pay | Admitting: Family Medicine

## 2011-07-30 ENCOUNTER — Other Ambulatory Visit: Payer: Self-pay | Admitting: Family Medicine

## 2011-08-02 ENCOUNTER — Ambulatory Visit: Payer: 59 | Admitting: Family Medicine

## 2011-10-01 ENCOUNTER — Encounter: Payer: Self-pay | Admitting: Family Medicine

## 2011-10-01 ENCOUNTER — Ambulatory Visit (INDEPENDENT_AMBULATORY_CARE_PROVIDER_SITE_OTHER): Payer: 59 | Admitting: Family Medicine

## 2011-10-01 VITALS — BP 130/84 | HR 80 | Wt 218.0 lb

## 2011-10-01 DIAGNOSIS — Z1331 Encounter for screening for depression: Secondary | ICD-10-CM

## 2011-10-01 DIAGNOSIS — D509 Iron deficiency anemia, unspecified: Secondary | ICD-10-CM | POA: Diagnosis not present

## 2011-10-01 DIAGNOSIS — Z9181 History of falling: Secondary | ICD-10-CM | POA: Diagnosis not present

## 2011-10-01 DIAGNOSIS — I10 Essential (primary) hypertension: Secondary | ICD-10-CM

## 2011-10-01 DIAGNOSIS — E669 Obesity, unspecified: Secondary | ICD-10-CM

## 2011-10-01 MED ORDER — AMBULATORY NON FORMULARY MEDICATION: Status: DC

## 2011-10-01 NOTE — Progress Notes (Addendum)
  Subjective:    Patient ID: Ian Bushman, female    DOB: [birth year], 66 y.o.   MRN: [id number]  HPI HTN - Has been able to exercise.  Says has been a really busy year. Says plans on joining weight watchers.  No CP or SOB.     Review of Systems     Objective:   Physical Exam  Constitutional: She is oriented to person, place, and time. She appears well-developed and well-nourished.  HENT:  Head: Normocephalic and atraumatic.  Cardiovascular: Normal rate, regular rhythm and normal heart sounds.        No carotid bruits.   Pulmonary/Chest: Effort normal and breath sounds normal.  Neurological: She is alert and oriented to person, place, and time.  Skin: Skin is warm and dry.  Psychiatric: She has a normal mood and affect. Her behavior is normal.          Assessment & Plan:  HTN - Rechexck BP is well controlled. Due for screening labs. F/U in 6 months.    Obesity - work on weight loss. She says she plans on starting Weight Watchers be fantastic. She has gained 6 pounds in the last year. We'll recheck in 6 months.  Iron deficiency-she says the last time she tried to give blood about a month ago she was told that she could and was told to start an iron supplement. She has been taking this over-the-counter. She would like to have her lab work rechecked. We will check her ferritin and CBC.  Fall assessment-score of 3, low risk for falls.  Depression screen-PHQ 9 score of 2, negative for depression.

## 2011-10-02 ENCOUNTER — Other Ambulatory Visit: Payer: Self-pay | Admitting: Family Medicine

## 2011-10-02 LAB — TSH: TSH: 1.76 u[IU]/mL (ref 0.350–4.500)

## 2011-10-02 LAB — COMPLETE METABOLIC PANEL WITH GFR
AST: 25 U/L (ref 0–37)
Albumin: 4.5 g/dL (ref 3.5–5.2)
Alkaline Phosphatase: 74 U/L (ref 39–117)
BUN: 25 mg/dL — ABNORMAL HIGH (ref 6–23)
Creat: 0.97 mg/dL (ref 0.50–1.10)
GFR, Est Non African American: 61 mL/min
Glucose, Bld: 103 mg/dL — ABNORMAL HIGH (ref 70–99)
Potassium: 3.8 mEq/L (ref 3.5–5.3)
Total Bilirubin: 0.5 mg/dL (ref 0.3–1.2)

## 2011-10-02 LAB — LIPID PANEL
Cholesterol: 199 mg/dL (ref 0–200)
HDL: 42 mg/dL (ref >39)
Total CHOL/HDL Ratio: 4.7 Ratio
VLDL: 38 mg/dL (ref 0–40)

## 2011-10-02 LAB — CBC
HCT: 35.4 % — ABNORMAL LOW (ref 36.0–46.0)
Hemoglobin: 11 g/dL — ABNORMAL LOW (ref 12.0–15.0)
MCHC: 31.1 g/dL (ref 30.0–36.0)
RBC: 4.23 MIL/uL (ref 3.87–5.11)
WBC: 3.9 10*3/uL — ABNORMAL LOW (ref 4.0–10.5)

## 2011-10-02 LAB — FERRITIN: Ferritin: 143 ng/mL (ref 10–291)

## 2011-10-02 LAB — VITAMIN B12: Vitamin B-12: 1244 pg/mL — ABNORMAL HIGH (ref 211–911)

## 2011-10-02 LAB — FOLATE: Folate: >20 ng/mL

## 2011-10-02 NOTE — Addendum Note (Signed)
Addended by: Ellsworth Lennox on: 10/02/2011 11:23 AM   Modules accepted: Orders

## 2011-10-09 ENCOUNTER — Encounter: Payer: Self-pay | Admitting: Gastroenterology

## 2011-11-05 ENCOUNTER — Encounter: Payer: Self-pay | Admitting: Gastroenterology

## 2011-11-22 ENCOUNTER — Ambulatory Visit (AMBULATORY_SURGERY_CENTER): Payer: 59 | Admitting: *Deleted

## 2011-11-22 VITALS — Ht 70.0 in | Wt 218.2 lb

## 2011-11-22 DIAGNOSIS — Z1211 Encounter for screening for malignant neoplasm of colon: Secondary | ICD-10-CM

## 2011-11-22 MED ORDER — MOVIPREP 100 G PO SOLR: 1.0000 | Freq: Once | ORAL | Status: DC

## 2011-12-06 ENCOUNTER — Ambulatory Visit (AMBULATORY_SURGERY_CENTER): Payer: 59 | Admitting: Gastroenterology

## 2011-12-06 ENCOUNTER — Encounter: Payer: Self-pay | Admitting: Gastroenterology

## 2011-12-06 VITALS — BP 126/66 | HR 64 | Temp 97.1°F | Resp 16 | Ht 70.0 in | Wt 218.0 lb

## 2011-12-06 DIAGNOSIS — K5730 Diverticulosis of large intestine without perforation or abscess without bleeding: Secondary | ICD-10-CM

## 2011-12-06 DIAGNOSIS — Z1211 Encounter for screening for malignant neoplasm of colon: Secondary | ICD-10-CM | POA: Diagnosis not present

## 2011-12-06 MED ORDER — SODIUM CHLORIDE 0.9 % IV SOLN: 500.0000 mL | INTRAVENOUS | Status: DC

## 2011-12-06 NOTE — Op Note (Signed)
[facility] [address] [place], [address]   COLONOSCOPY PROCEDURE REPORT  PATIENT: [name], [name]  MR#: [id number] BIRTHDATE: [birth year] , 66  yrs. old GENDER: Female ENDOSCOPIST: Mardella Layman, MD, Clementeen Graham REFERRED BY:  Nani Gasser, M.D. PROCEDURE DATE:  12/06/2011 PROCEDURE:   Colonoscopy, surveillance ASA CLASS:   Class II INDICATIONS:average risk patient for colon cancer. MEDICATIONS: propofol (Diprivan) 150mg  IV  DESCRIPTION OF PROCEDURE:   After the risks and benefits and of the procedure were explained, informed consent was obtained.  A digital rectal exam revealed no abnormalities of the rectum.    The LB [id number]  endoscope was introduced through the anus and advanced to the cecum, which was identified by both the appendix and ileocecal valve .  The quality of the prep was excellent, using MoviPrep .  The instrument was then slowly withdrawn as the colon was fully examined.     COLON FINDINGS: Moderate diverticulosis was noted throughout the entire examined colon.   The colon was otherwise normal.  There was no diverticulosis, inflammation, polyps or cancers unless previously stated.     Retroflexed views revealed no abnormalities. The scope was then withdrawn from the patient and the procedure completed.  COMPLICATIONS: There were no complications. ENDOSCOPIC IMPRESSION: 1.   Moderate diverticulosis was noted throughout the entire examined colon 2.   The colon was otherwise normal  RECOMMENDATIONS: 1.  High fiber diet 2.  Continue current colorectal screening recommendations for "routine risk" patients with a repeat colonoscopy in 10 years.   REPEAT EXAM:  cc:  _______________________________ eSignedMardella Layman, MD, [facility] 12/06/2011 8:48 AM

## 2011-12-06 NOTE — Patient Instructions (Signed)
YOU HAD AN ENDOSCOPIC PROCEDURE TODAY AT THE [facility]: Refer to the procedure report that was given to you for any specific questions about what was found during the examination.  If the procedure report does not answer your questions, please call your gastroenterologist to clarify.  If you requested that your care partner not be given the details of your procedure findings, then the procedure report has been included in a sealed envelope for you to review at your convenience later.  YOU SHOULD EXPECT: Some feelings of bloating in the abdomen. Passage of more gas than usual.  Walking can help get rid of the air that was put into your GI tract during the procedure and reduce the bloating. If you had a lower endoscopy (such as a colonoscopy or flexible sigmoidoscopy) you may notice spotting of blood in your stool or on the toilet paper. If you underwent a bowel prep for your procedure, then you may not have a normal bowel movement for a few days.  DIET: Your first meal following the procedure should be a light meal and then it is ok to progress to your normal diet.  A half-sandwich or bowl of soup is an example of a good first meal.  Heavy or fried foods are harder to digest and may make you feel nauseous or bloated.  Likewise meals heavy in dairy and vegetables can cause extra gas to form and this can also increase the bloating.  Drink plenty of fluids but you should avoid alcoholic beverages for 24 hours.  ACTIVITY: Your care partner should take you home directly after the procedure.  You should plan to take it easy, moving slowly for the rest of the day.  You can resume normal activity the day after the procedure however you should NOT DRIVE or use heavy machinery for 24 hours (because of the sedation medicines used during the test).    SYMPTOMS TO REPORT IMMEDIATELY: A gastroenterologist can be reached at any hour.  During normal business hours, 8:30 AM to 5:00 PM Monday through Friday,  call [phone].  After hours and on weekends, please call the GI answering service at [phone] who will take a message and have the physician on call contact you.   Following lower endoscopy (colonoscopy or flexible sigmoidoscopy):  Excessive amounts of blood in the stool  Significant tenderness or worsening of abdominal pains  Swelling of the abdomen that is new, acute  Fever of 100F or higher    FOLLOW UP: If any biopsies were taken you will be contacted by phone or by letter within the next 1-3 weeks.  Call your gastroenterologist if you have not heard about the biopsies in 3 weeks.  Our staff will call the home number listed on your records the next business day following your procedure to check on you and address any questions or concerns that you may have at that time regarding the information given to you following your procedure. This is a courtesy call and so if there is no answer at the home number and we have not heard from you through the emergency physician on call, we will assume that you have returned to your regular daily activities without incident.  SIGNATURES/CONFIDENTIALITY: You and/or your care partner have signed paperwork which will be entered into your electronic medical record.  These signatures attest to the fact that that the information above on your After Visit Summary has been reviewed and is understood.  Full responsibility of the confidentiality   of this discharge information lies with you and/or your care-partner.     Information on diverticulosis & high fiber diet given to you today 

## 2011-12-06 NOTE — Progress Notes (Signed)
Propofol per [facility] crna, see scanned intra procedure report. ewm 

## 2011-12-06 NOTE — Progress Notes (Signed)
Patient did not experience any of the following events: a burn prior to discharge; a fall within the facility; wrong site/side/patient/procedure/implant event; or a hospital transfer or hospital admission upon discharge from the facility. (G8907) Patient did not have preoperative order for IV antibiotic SSI prophylaxis. (G8918)  

## 2011-12-07 ENCOUNTER — Telehealth: Payer: Self-pay | Admitting: *Deleted

## 2011-12-07 NOTE — Telephone Encounter (Signed)
  Follow up Call-  Call back number 12/06/2011  Post procedure Call Back phone  # [phone]  Permission to leave phone message Yes     Patient questions:  Do you have a fever, pain , or abdominal swelling? no Pain Score  0 *  Have you tolerated food without any problems? yes  Have you been able to return to your normal activities? yes  Do you have any questions about your discharge instructions: Diet   no Medications  no Follow up visit  no  Do you have questions or concerns about your Care? no  Actions: * If pain score is 4 or above: No action needed, pain <4.

## 2012-01-24 ENCOUNTER — Other Ambulatory Visit: Payer: Self-pay | Admitting: Family Medicine

## 2012-01-28 ENCOUNTER — Other Ambulatory Visit: Payer: Self-pay | Admitting: Family Medicine

## 2012-02-14 ENCOUNTER — Ambulatory Visit (INDEPENDENT_AMBULATORY_CARE_PROVIDER_SITE_OTHER): Payer: 59 | Admitting: Family Medicine

## 2012-02-14 ENCOUNTER — Encounter: Payer: Self-pay | Admitting: Family Medicine

## 2012-02-14 VITALS — BP 139/81 | HR 70 | Ht 70.0 in | Wt 209.0 lb

## 2012-02-14 DIAGNOSIS — E785 Hyperlipidemia, unspecified: Secondary | ICD-10-CM | POA: Diagnosis not present

## 2012-02-14 DIAGNOSIS — I10 Essential (primary) hypertension: Secondary | ICD-10-CM

## 2012-02-14 DIAGNOSIS — Z0000 Encounter for general adult medical examination without abnormal findings: Secondary | ICD-10-CM | POA: Diagnosis not present

## 2012-02-14 LAB — COMPLETE METABOLIC PANEL WITHOUT GFR
ALT: 19 U/L (ref 0–35)
AST: 25 U/L (ref 0–37)
Albumin: 4.8 g/dL (ref 3.5–5.2)
Alkaline Phosphatase: 81 U/L (ref 39–117)
BUN: 18 mg/dL (ref 6–23)
CO2: 29 meq/L (ref 19–32)
Calcium: 9.5 mg/dL (ref 8.4–10.5)
Chloride: 99 meq/L (ref 96–112)
Creat: 1.12 mg/dL — ABNORMAL HIGH (ref 0.50–1.10)
GFR, Est African American: 59 mL/min — ABNORMAL LOW
GFR, Est Non African American: 51 mL/min — ABNORMAL LOW
Glucose, Bld: 96 mg/dL (ref 70–99)
Potassium: 3.9 meq/L (ref 3.5–5.3)
Sodium: 137 meq/L (ref 135–145)
Total Bilirubin: 0.5 mg/dL (ref 0.3–1.2)
Total Protein: 7.3 g/dL (ref 6.0–8.3)

## 2012-02-14 LAB — LIPID PANEL
Cholesterol: 178 mg/dL (ref 0–200)
HDL: 38 mg/dL — ABNORMAL LOW
LDL Cholesterol: 115 mg/dL — ABNORMAL HIGH (ref 0–99)
Total CHOL/HDL Ratio: 4.7 ratio
Triglycerides: 125 mg/dL
VLDL: 25 mg/dL (ref 0–40)

## 2012-02-14 MED ORDER — AMBULATORY NON FORMULARY MEDICATION: Status: DC

## 2012-02-14 NOTE — Patient Instructions (Addendum)
He can schedule the removal of a sebaceous cyst on her back at any time. We will call you with your lab results. If you don't here from [place] in about a week then please give [place] a call at [phone]. Keep up a regular exercise program and make sure you are eating a healthy diet Try to eat 4 servings of dairy a day, or if you are lactose intolerant take a calcium with vitamin D daily.  Your vaccines are up to date.

## 2012-02-14 NOTE — Progress Notes (Signed)
Subjective:    [name] is a 66 y.o. female who presents for Medicare Initial preventive examination.  Preventive Screening-Counseling & Management  Tobacco History  Smoking status  . Never Smoker   Smokeless tobacco  . Never Used     Problems Prior to Visit 1. None. No complaints today. She does have a cyst on her back that she wants me to look at. Has been lanced previously several years ago by dermatology. Still occasionally expresses a material it has over and she just wants me to examine it. No pain or active discharge today.  Current Problems (verified) Patient Active Problem List  Diagnosis  . HYPERLIPIDEMIA  . HYPERTENSION, BENIGN  . KNEE PAIN, RIGHT    Medications Prior to Visit Current Outpatient Prescriptions on File Prior to Visit  Medication Sig Dispense Refill  . amLODipine (NORVASC) 5 MG tablet TAKE 1 TABLET BY MOUTH EVERY DAY  90 tablet  1  . aspirin 81 MG tablet Take 81 mg by mouth daily.        . Calcium Carbonate-Vitamin D (CALCIUM 600+D) 600-200 MG-UNIT TABS Take by mouth.        . cholecalciferol (VITAMIN D) 400 UNITS TABS Take by mouth.        . Echinacea 450 MG CAPS Take 1 capsule by mouth daily.      . Ginkgo Biloba (GNP GINGKO BILOBA EXTRACT PO) Take by mouth.      Marland Kitchen lisinopril-hydrochlorothiazide (PRINZIDE,ZESTORETIC) 20-12.5 MG per tablet TAKE 1 TABLET BY MOUTH EVERY DAY  90 tablet  1  . MAGNESIUM PO Take by mouth.      . Multiple Vitamins-Minerals (MULTIVITAMIN PO) Take by mouth.      . Omega-3 Fatty Acids (FISH OIL) 1000 MG CPDR Take by mouth.          Current Medications (verified) Current Outpatient Prescriptions  Medication Sig Dispense Refill  . amLODipine (NORVASC) 5 MG tablet TAKE 1 TABLET BY MOUTH EVERY DAY  90 tablet  1  . aspirin 81 MG tablet Take 81 mg by mouth daily.        . Calcium Carbonate-Vitamin D (CALCIUM 600+D) 600-200 MG-UNIT TABS Take by mouth.        . cholecalciferol (VITAMIN D) 400 UNITS TABS Take by mouth.         . Echinacea 450 MG CAPS Take 1 capsule by mouth daily.      . Ginkgo Biloba (GNP GINGKO BILOBA EXTRACT PO) Take by mouth.      Marland Kitchen lisinopril-hydrochlorothiazide (PRINZIDE,ZESTORETIC) 20-12.5 MG per tablet TAKE 1 TABLET BY MOUTH EVERY DAY  90 tablet  1  . MAGNESIUM PO Take by mouth.      . Multiple Vitamins-Minerals (MULTIVITAMIN PO) Take by mouth.      . Omega-3 Fatty Acids (FISH OIL) 1000 MG CPDR Take by mouth.        . Vitamin D, Ergocalciferol, (DRISDOL) 50000 UNITS CAPS Take 50,000 Units by mouth daily.      . AMBULATORY NON FORMULARY MEDICATION Medication Name: zostavax IM x 1  1 vial  0     Allergies (verified) Penicillins   PAST HISTORY  Family History Family History  Problem Relation Age of Onset  . Breast cancer Sister   . Hypertension Sister   . Heart attack Father   . Hypertension Father   . Hypertension Mother   . Kidney disease Mother   . Hypertension Brother   . Colon cancer Neg Hx   . Esophageal cancer  Neg Hx   . Rectal cancer Neg Hx   . Stomach cancer Neg Hx     Social History History  Substance Use Topics  . Smoking status: Never Smoker   . Smokeless tobacco: Never Used  . Alcohol Use: No     Are there smokers in your home (other than you)? Yes, husband  Risk Factors Current exercise habits: occ exercise. Has been walking  Dietary issues discussed: none   Cardiac risk factors: advanced age (older than 86 for men, 60 for women), dyslipidemia, hypertension and obesity (BMI >= 30 kg/m2).  Depression Screen (Note: if answer to either of the following is "Yes", a more complete depression screening is indicated)   Over the past 2 weeks, have you felt down, depressed or hopeless? No  Over the past 2 weeks, have you felt little interest or pleasure in doing things? No  Have you lost interest or pleasure in daily life? No  Do you often feel hopeless? No  Do you cry easily over simple problems? No  Activities of Daily Living In your present state of  health, do you have any difficulty performing the following activities?:  Driving? No Managing money?  No Feeding yourself? No Getting from bed to chair? No Climbing a flight of stairs? No Preparing food and eating?: No Bathing or showering? No Getting dressed: No Getting to the toilet? No Using the toilet:No Moving around from place to place: No In the past year have you fallen or had a near fall?:No   Are you sexually active?  Yes  Do you have more than one partner?  No  Hearing Difficulties: No Do you often ask people to speak up or repeat themselves? No Do you experience ringing or noises in your ears? No Do you have difficulty understanding soft or whispered voices? No   Do you feel that you have a problem with memory? No  Do you often misplace items? No  Do you feel safe at home?  Yes  Cognitive Testing  Alert? Yes  Normal Appearance?Yes  Oriented to person? Yes  Place? Yes   Time? Yes  Recall of three objects?  Yes  Can perform simple calculations? Yes  Displays appropriate judgment?Yes  Can read the correct time from a watch face?Yes   Advanced Directives have been discussed with the patient? Not asked.   List the Names of Other Physician/Practitioners you currently use: 1.    Indicate any recent Medical Services you may have received from other than [facility] providers in the past year (date may be approximate).  Immunization History  Administered Date(s) Administered  . Influenza Split 01/30/2011  . Pneumococcal Polysaccharide 01/30/2011  . Td 02/27/1995, 02/27/2007    Screening Tests Health Maintenance  Topic Date Due  . Zostavax  06/23/2005  . Influenza Vaccine  10/28/2011  . Mammogram  04/15/2013  . Tetanus/tdap  02/26/2017  . Colonoscopy  12/05/2021  . Pneumococcal Polysaccharide Vaccine Age 39 And Over  Completed    All answers were reviewed with the patient and necessary referrals were made:  METHENEY,CATHERINE, MD   02/14/2012   History reviewed:  allergies, current medications, past family history, past medical history, past social history, past surgical history and problem list  Review of Systems A comprehensive review of systems was negative.    Objective:     Vision by Snellen chart: right HYQ:MVHQION declines measurement, left GEX:BMWUXLK declines measurement She just had eye exam a week ago with optimetrist.    Body mass  index is 29.99 kg/(m^2). BP 139/81  Pulse 70  Ht 5\' 10"  (1.778 m)  Wt 209 lb (94.802 kg)  BMI 29.99 kg/m2  BP 139/81  Pulse 70  Ht 5\' 10"  (1.778 m)  Wt 209 lb (94.802 kg)  BMI 29.99 kg/m2  General Appearance:    Alert, cooperative, no distress, appears stated age  Head:    Normocephalic, without obvious abnormality, atraumatic  Eyes:    PERRL, conjunctiva/corneas clear, EOM's intact, benign, both eyes  Ears:    Normal TM's and external ear canals, both ears  Nose:   Nares normal, septum midline, mucosa normal, no drainage    or sinus tenderness  Throat:   Lips, mucosa, and tongue normal; teeth and gums normal  Neck:   Supple, symmetrical, trachea midline, no adenopathy;    thyroid:  no enlargement/tenderness/nodules; no carotid   bruit or JVD  Back:     Symmetric, no curvature, ROM normal, no CVA tenderness  Lungs:     Clear to auscultation bilaterally, respirations unlabored  Chest Wall:    No tenderness or deformity   Heart:    Regular rate and rhythm, S1 and S2 normal, no murmur, rub   or gallop  Breast Exam:    No tenderness, masses, or nipple abnormality, scars well healed from breast reduction surgery  Abdomen:     Soft, non-tender, bowel sounds active all four quadrants,    no masses, no organomegaly  Genitalia:    Not peformed  Rectal:    Not performed   Extremities:   Extremities normal, atraumatic, no cyanosis or edema  Pulses:   2+ and symmetric all extremities  Skin:   Skin color, texture, turgor normal, no rashes or lesions  Lymph nodes:   Cervical, supraclavicular, and axillary  nodes normal  Neurologic:   CNII-XII intact, normal strength, sensation and reflexes    throughout       Assessment:     Annual Wellness Exam, Medicare     Plan:     During the course of the visit the patient was educated and counseled about appropriate screening and preventive services including:    shingles vaccine  Recommend f/u for removal of sebaceous cyst on her back, can schedule at any time.   Had full eye exam with her optometrist, Dr. Jackquline Bosch, last week.  Mammogram is up-to-date was performed in February and she has not had any problems or new symptoms.  Colonoscopy is up-to-date.  All other vaccines are up-to-date  Due for CMP and fasting lipid panel today.  Diet review for nutrition referral? Yes ____  Not Indicated _x___   Patient Instructions (the written plan) was given to the patient.  Medicare Attestation I have personally reviewed: The patient's medical and social history Their use of alcohol, tobacco or illicit drugs Their current medications and supplements The patient's functional ability including ADLs,fall risks, home safety risks, cognitive, and hearing and visual impairment Diet and physical activities Evidence for depression or mood disorders  The patient's weight, height, BMI, and visual acuity have been recorded in the chart.  I have made referrals, counseling, and provided education to the patient based on review of the above and I have provided the patient with a written personalized care plan for preventive services.     METHENEY,CATHERINE, MD   02/14/2012

## 2012-03-24 ENCOUNTER — Other Ambulatory Visit: Payer: Self-pay | Admitting: Family Medicine

## 2012-03-24 DIAGNOSIS — Z1231 Encounter for screening mammogram for malignant neoplasm of breast: Secondary | ICD-10-CM

## 2012-03-31 ENCOUNTER — Encounter: Payer: 59 | Admitting: Family Medicine

## 2012-04-16 ENCOUNTER — Encounter: Payer: Self-pay | Admitting: Women's Health

## 2012-04-16 ENCOUNTER — Ambulatory Visit (HOSPITAL_COMMUNITY)
Admission: RE | Admit: 2012-04-16 | Discharge: 2012-04-16 | Disposition: A | Discharge status: Home / Self Care | Payer: 59 | Source: Ambulatory Visit | Attending: Family Medicine | Admitting: Family Medicine

## 2012-04-16 ENCOUNTER — Ambulatory Visit (INDEPENDENT_AMBULATORY_CARE_PROVIDER_SITE_OTHER): Payer: 59 | Admitting: Women's Health

## 2012-04-16 ENCOUNTER — Encounter: Payer: Medicare Other | Admitting: Women's Health

## 2012-04-16 VITALS — BP 118/76 | Ht 69.5 in | Wt 202.0 lb

## 2012-04-16 DIAGNOSIS — Z01419 Encounter for gynecological examination (general) (routine) without abnormal findings: Secondary | ICD-10-CM | POA: Diagnosis not present

## 2012-04-16 DIAGNOSIS — Z1231 Encounter for screening mammogram for malignant neoplasm of breast: Secondary | ICD-10-CM | POA: Insufficient documentation

## 2012-04-16 DIAGNOSIS — I10 Essential (primary) hypertension: Secondary | ICD-10-CM

## 2012-04-16 NOTE — Progress Notes (Signed)
[name] 10-06-1945 [id number]    History:    The patient presents for Breast and pelvic exam. Normal bone density 2013 T score 3.8 at spine, bilateral hip average 1.7. TAH for fibroids. Hypertension, hypercholesterolemia managed by primary care. Normal colonoscopy 2013. History of normal Paps and mammograms.   Past medical history, past surgical history, family history and social history were all reviewed and documented in the EPIC chart.   ROS:  A  ROS was performed and pertinent positives and negatives are included in the history.  Exam:  Filed Vitals:   04/16/12 0946  BP: 118/76    General appearance:  Normal Head/Neck:  Normal, without cervical or supraclavicular adenopathy. Thyroid:  Symmetrical, normal in size, without palpable masses or nodularity. Respiratory  Effort:  Normal  Auscultation:  Clear without wheezing or rhonchi Cardiovascular  Auscultation:  Regular rate, without rubs, murmurs or gallops  Edema/varicosities:  Not grossly evident Abdominal  Soft,nontender, without masses, guarding or rebound.  Liver/spleen:  No organomegaly noted  Hernia:  None appreciated  Skin  Inspection:  Grossly normal  Palpation:  Grossly normal Neurologic/psychiatric  Orientation:  Normal with appropriate conversation.  Mood/affect:  Normal  Genitourinary    Breasts: Examined lying and sitting.     Right: Without masses, retractions, discharge or axillary adenopathy.     Left: Without masses, retractions, discharge or axillary adenopathy.   Inguinal/mons:  Normal without inguinal adenopathy  External genitalia:  Normal  BUS/Urethra/Skene's glands:  Normal  Bladder:  Normal  Vagina:  Normal  Cervix:  absent  Uterus:  absent  Adnexa/parametria:     Rt: Without masses or tenderness.   Lt: Without masses or tenderness.  Anus and perineum: Normal  Digital rectal exam: Normal sphincter tone without palpated masses or tenderness  Assessment/Plan:  67 y.o. MBF G0 for  breast and pelvic exam with no complaints.  Normal postmenopausal exam no ERT Hypertension primary care labs and meds Normal DEXA 04/2011 Negative colonoscopy 2013  Plan: SBE's, continue annual mammogram, scheduled today, increase regular exercise, decrease calories for weight loss. Vitamin D 2000 daily encouraged, home safety and fall prevention discussed. Pap screening guidelines reviewed.      [name], [name] [facility], 6:05 PM 04/16/2012

## 2012-04-16 NOTE — Patient Instructions (Addendum)

## 2012-06-16 ENCOUNTER — Encounter: Payer: Self-pay | Admitting: Physician Assistant

## 2012-06-16 ENCOUNTER — Ambulatory Visit (INDEPENDENT_AMBULATORY_CARE_PROVIDER_SITE_OTHER): Payer: 59 | Admitting: Physician Assistant

## 2012-06-16 VITALS — BP 142/78 | HR 76 | Wt 213.0 lb

## 2012-06-16 DIAGNOSIS — J329 Chronic sinusitis, unspecified: Secondary | ICD-10-CM | POA: Diagnosis not present

## 2012-06-16 DIAGNOSIS — J3489 Other specified disorders of nose and nasal sinuses: Secondary | ICD-10-CM | POA: Diagnosis not present

## 2012-06-16 DIAGNOSIS — J309 Allergic rhinitis, unspecified: Secondary | ICD-10-CM

## 2012-06-16 DIAGNOSIS — R05 Cough: Secondary | ICD-10-CM

## 2012-06-16 DIAGNOSIS — J302 Other seasonal allergic rhinitis: Secondary | ICD-10-CM

## 2012-06-16 DIAGNOSIS — R0981 Nasal congestion: Secondary | ICD-10-CM

## 2012-06-16 MED ORDER — AZITHROMYCIN 250 MG PO TABS: ORAL_TABLET | ORAL | Status: DC

## 2012-06-16 MED ORDER — HYDROCODONE-HOMATROPINE 5-1.5 MG/5ML PO SYRP: 5.0000 mL | ORAL_SOLUTION | Freq: Every evening | ORAL | Status: DC | PRN

## 2012-06-16 MED ORDER — FLUTICASONE PROPIONATE 50 MCG/ACT NA SUSP: 2.0000 | Freq: Every day | NASAL | Status: DC

## 2012-06-16 NOTE — Patient Instructions (Addendum)
Continue Zyrtec daily.  Nasal spray to use once a day 2 sprays.  Decongestant(Sudafed) 3-4 days.  Cough syrup to use at night.  Daily Vitamin  Nettie pots/sinuses rinses .    Then if still not improving can consider antibiotic.

## 2012-06-16 NOTE — Progress Notes (Signed)
  Subjective:    Patient ID: [name], female    DOB: 1945-04-28, 67 y.o.   MRN: [id number]  HPI Patient presents to the clinic with 2 weeks of nasal congestion, throat tickle, dry cough, watery eyes. She started zyrtec and thought it was helping. Nasal congestion is worse today as well as sinus pressure. Denies any wheezing, SOB, fever, n/v/d. Cough is worse at night. Though she might be getting better and then seems to start back up.      Review of Systems     Objective:   Physical Exam  Constitutional: She is oriented to person, place, and time. She appears well-developed and well-nourished.  HENT:  Head: Normocephalic and atraumatic.  Right Ear: External ear normal.  PND oropharynx.   Bilateral turbinates and red and swollen.   Eyes: Conjunctivae are normal.   Bilateral conjunctiva injected.   Neck: Normal range of motion. Neck supple.  Cardiovascular: Normal rate, regular rhythm and normal heart sounds.   Pulmonary/Chest: Effort normal and breath sounds normal. She has no wheezes.  Lymphadenopathy:    She has no cervical adenopathy.  Neurological: She is alert and oriented to person, place, and time.  Skin: Skin is warm and dry.  Psychiatric: She has a normal mood and affect. Her behavior is normal.          Assessment & Plan:  Seasonal allergies/nasal conjestion- Seems typical of allergies. Continue Zyrtec daily.  Nasal spray to use once a day 2 sprays.  Decongestant(Sudafed) 3-4 days.  Cough syrup to use at night.  Daily Vitamin  Nettie pots/sinuses rinses .    Then if still not improving can consider antibiotic.

## 2012-07-24 ENCOUNTER — Other Ambulatory Visit: Payer: Self-pay | Admitting: Family Medicine

## 2012-07-24 ENCOUNTER — Ambulatory Visit (INDEPENDENT_AMBULATORY_CARE_PROVIDER_SITE_OTHER): Payer: 59 | Admitting: Family Medicine

## 2012-07-24 ENCOUNTER — Telehealth: Payer: Self-pay | Admitting: *Deleted

## 2012-07-24 ENCOUNTER — Encounter: Payer: Self-pay | Admitting: Family Medicine

## 2012-07-24 VITALS — BP 120/66 | HR 74 | Wt 210.0 lb

## 2012-07-24 DIAGNOSIS — R1031 Right lower quadrant pain: Secondary | ICD-10-CM | POA: Diagnosis not present

## 2012-07-24 DIAGNOSIS — M79604 Pain in right leg: Secondary | ICD-10-CM

## 2012-07-24 DIAGNOSIS — M79609 Pain in unspecified limb: Secondary | ICD-10-CM

## 2012-07-24 LAB — COMPLETE METABOLIC PANEL WITH GFR
ALT: 19 U/L (ref 0–35)
AST: 23 U/L (ref 0–37)
Albumin: 4.5 g/dL (ref 3.5–5.2)
Alkaline Phosphatase: 69 U/L (ref 39–117)
Calcium: 9.6 mg/dL (ref 8.4–10.5)
Chloride: 98 mEq/L (ref 96–112)
Potassium: 4.3 mEq/L (ref 3.5–5.3)
Sodium: 137 mEq/L (ref 135–145)
Total Protein: 7.3 g/dL (ref 6.0–8.3)

## 2012-07-24 LAB — CBC WITH DIFFERENTIAL/PLATELET
Basophils Relative: 1 % (ref 0–1)
Eosinophils Relative: 3 % (ref 0–5)
HCT: 34.6 % — ABNORMAL LOW (ref 36.0–46.0)
Hemoglobin: 11.3 g/dL — ABNORMAL LOW (ref 12.0–15.0)
MCH: 26.8 pg (ref 26.0–34.0)
MCHC: 32.7 g/dL (ref 30.0–36.0)
MCV: 82 fL (ref 78.0–100.0)
Monocytes Absolute: 0.4 10*3/uL (ref 0.1–1.0)
Monocytes Relative: 8 % (ref 3–12)
Neutro Abs: 1.6 10*3/uL — ABNORMAL LOW (ref 1.7–7.7)
RDW: 14.7 % (ref 11.5–15.5)

## 2012-07-24 NOTE — Progress Notes (Signed)
Subjective:    Patient ID: [name], female    DOB: 08-30-1945, 67 y.o.   MRN: [id number]  HPI Right side abdominal pain started about a month ago. Feels achey.  Now radiating down her right leg. No fever.  EAting or BMs don't affect.  Says Aleve helps some but not completely.  Some numbness in the right foot.  No swelling.  No bloating.  No nausea.  Normal appetitie. No chills or sweats. 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  Hx of hysterectomy but not sure if has her ovaries.  No change in stools. No blood in stool. Some low back pain for several months.     Review of Systems BP 120/66  Pulse 74  Wt 210 lb (95.255 kg)  BMI 30.58 kg/m2    Allergies  Allergen Reactions  . Penicillins Rash    REACTION: rash    Past Medical History  Diagnosis Date  . Hypertension   . Hyperlipidemia     borderline  . Hiatal hernia   . Diverticulosis     Past Surgical History  Procedure Laterality Date  . Complete hysterectomy  1976  . Breast reduction surgery  1994    History   Social History  . Marital Status: Married    Spouse Name: N/A    Number of Children: 0  . Years of Education: N/A   Occupational History  . Not on file.   Social History Main Topics  . Smoking status: Never Smoker   . Smokeless tobacco: Never Used  . Alcohol Use: No  . Drug Use: No  . Sexually Active: No   Other Topics Concern  . Not on file   Social History Narrative   Working out some.     Family History  Problem Relation Age of Onset  . Breast cancer Sister 64    COD breast cancer and pneumonia  . Hypertension Sister   . Heart attack Father   . Hypertension Father   . Hypertension Mother   . Kidney disease Mother   . Hypertension Brother   . Colon cancer Neg Hx   . Esophageal cancer Neg Hx   . Rectal cancer Neg Hx   . Stomach cancer Neg Hx     Outpatient Encounter Prescriptions as of 07/24/2012  Medication Sig Dispense Refill  . amLODipine  (NORVASC) 5 MG tablet TAKE 1 TABLET BY MOUTH EVERY DAY  90 tablet  1  . aspirin 81 MG tablet Take 81 mg by mouth daily.        . Calcium Carbonate-Vitamin D (CALCIUM 600+D) 600-200 MG-UNIT TABS Take by mouth.        . cholecalciferol (VITAMIN D) 400 UNITS TABS Take by mouth.        . Echinacea 450 MG CAPS Take 1 capsule by mouth daily.      . fluticasone (FLONASE) 50 MCG/ACT nasal spray Place 2 sprays into the nose daily.  16 g  2  . Ginkgo Biloba (GNP GINGKO BILOBA EXTRACT PO) Take by mouth.      Marland Kitchen lisinopril-hydrochlorothiazide (PRINZIDE,ZESTORETIC) 20-12.5 MG per tablet TAKE 1 TABLET BY MOUTH EVERY DAY  90 tablet  1  . Multiple Vitamins-Minerals (MULTIVITAMIN PO) Take by mouth.      . Omega-3 Fatty Acids (FISH OIL) 1000 MG CPDR Take by mouth.        . Probiotic Product (PROBIOTIC DAILY PO) Take by mouth.      . [  DISCONTINUED] azithromycin (ZITHROMAX) 250 MG tablet 2 tabs first day and then 1 tab for 4 days.  6 each  0  . [DISCONTINUED] HYDROcodone-homatropine (HYCODAN) 5-1.5 MG/5ML syrup Take 5 mLs by mouth at bedtime as needed for cough.  120 mL  0   No facility-administered encounter medications on file as of 07/24/2012.          Objective:   Physical Exam  Constitutional: She is oriented to person, place, and time. She appears well-developed and well-nourished.  HENT:  Head: Normocephalic and atraumatic.  Cardiovascular: Normal rate, regular rhythm and normal heart sounds.   Pulmonary/Chest: Effort normal and breath sounds normal.  Abdominal: Soft. Bowel sounds are normal. She exhibits no distension and no mass. There is tenderness. There is no rebound and no guarding.  Mild tenderness in the lower RLQ. Not tender over teh hip  Has pain int eh abdomen with rotation of the right hip. Hip, knee, ankle strength is 5/5.  Musculoskeletal:  Low back is nontender on exam with NROM. Neg straight leg raise bilat.    Neurological: She is alert and oriented to person, place, and time.   Skin: Skin is warm and dry.  Psychiatric: She has a normal mood and affect. Her behavior is normal.          Assessment & Plan:  RLQ pain -unclear etiology at this point. She's not even sure she has her ovaries or not. She's not sure they're removed when she had her hysterectomy. I would like to start with a CT scan of the abdomen and pelvis to evaluate further specialist that has been persistent for months without any change in bowel movements. Also consider a low-grade appendicitis as a possible cause. She is only mildly tender on exam which is very reassuring. Also consider that this could be coming from the right hip and they really what she is pointing is just above her hip joint. If she gets a fever, vomiting or sudden onset of pain that is more severe than usual but I encouraged her to [facility] department if it's overnight her for the weekend.  Right leg pain-this could be certainly radiating from the abdomen that would be a little bit unusual. Could be also radiating from the right hip as well as the low back. She has had some low-grade low back pain for quite some time. She did not mention it has bothersome at first. She is nontender over the lumbar spine or paraspinous muscles. CT of the abdomen will pick up some findings of the lumbar spine so we can certainly take a better look at this if needed.

## 2012-07-24 NOTE — Telephone Encounter (Signed)
PA obtained for abd/pelvis w cm. # is [id number].  Carolyn at [employer] notified.

## 2012-07-25 ENCOUNTER — Ambulatory Visit (HOSPITAL_BASED_OUTPATIENT_CLINIC_OR_DEPARTMENT_OTHER)
Admission: RE | Admit: 2012-07-25 | Discharge: 2012-07-25 | Disposition: A | Discharge status: Home / Self Care | Payer: 59 | Source: Ambulatory Visit | Attending: Family Medicine | Admitting: Family Medicine

## 2012-07-25 ENCOUNTER — Other Ambulatory Visit: Payer: Self-pay | Admitting: Family Medicine

## 2012-07-25 DIAGNOSIS — M5137 Other intervertebral disc degeneration, lumbosacral region: Secondary | ICD-10-CM | POA: Diagnosis not present

## 2012-07-25 DIAGNOSIS — K7689 Other specified diseases of liver: Secondary | ICD-10-CM | POA: Insufficient documentation

## 2012-07-25 DIAGNOSIS — M51379 Other intervertebral disc degeneration, lumbosacral region without mention of lumbar back pain or lower extremity pain: Secondary | ICD-10-CM | POA: Insufficient documentation

## 2012-07-25 DIAGNOSIS — Q619 Cystic kidney disease, unspecified: Secondary | ICD-10-CM | POA: Insufficient documentation

## 2012-07-25 DIAGNOSIS — R1031 Right lower quadrant pain: Secondary | ICD-10-CM

## 2012-07-25 DIAGNOSIS — M79604 Pain in right leg: Secondary | ICD-10-CM

## 2012-07-25 DIAGNOSIS — N200 Calculus of kidney: Secondary | ICD-10-CM | POA: Diagnosis not present

## 2012-07-25 LAB — CA 125: CA 125: 6.8 U/mL (ref 0.0–30.2)

## 2012-07-25 LAB — VITAMIN B12: Vitamin B-12: 635 pg/mL (ref 211–911)

## 2012-07-25 MED ORDER — METRONIDAZOLE 500 MG PO TABS: 500.0000 mg | ORAL_TABLET | Freq: Two times a day (BID) | ORAL | Status: DC

## 2012-07-25 MED ORDER — IOHEXOL 300 MG/ML  SOLN
100.0000 mL | Freq: Once | INTRAMUSCULAR | Status: AC | PRN
  Administered 2012-07-25: 100 mL via INTRAVENOUS

## 2012-07-29 ENCOUNTER — Ambulatory Visit: Payer: 59 | Admitting: Family Medicine

## 2012-07-29 DIAGNOSIS — Z0289 Encounter for other administrative examinations: Secondary | ICD-10-CM

## 2012-07-30 ENCOUNTER — Ambulatory Visit (INDEPENDENT_AMBULATORY_CARE_PROVIDER_SITE_OTHER): Payer: 59 | Admitting: Family Medicine

## 2012-07-30 ENCOUNTER — Encounter: Payer: Self-pay | Admitting: Family Medicine

## 2012-07-30 VITALS — BP 112/64 | HR 88 | Wt 214.0 lb

## 2012-07-30 DIAGNOSIS — R195 Other fecal abnormalities: Secondary | ICD-10-CM | POA: Diagnosis not present

## 2012-07-30 DIAGNOSIS — M79604 Pain in right leg: Secondary | ICD-10-CM

## 2012-07-30 DIAGNOSIS — M79609 Pain in unspecified limb: Secondary | ICD-10-CM | POA: Diagnosis not present

## 2012-07-30 DIAGNOSIS — Q638 Other specified congenital malformations of kidney: Secondary | ICD-10-CM

## 2012-07-30 DIAGNOSIS — Q632 Ectopic kidney: Secondary | ICD-10-CM

## 2012-07-30 DIAGNOSIS — K5732 Diverticulitis of large intestine without perforation or abscess without bleeding: Secondary | ICD-10-CM | POA: Diagnosis not present

## 2012-07-30 NOTE — Progress Notes (Signed)
  Subjective:    Patient ID: [name], female    DOB: [birth year], 67 y.o.   MRN: [id number]  HPI Here to give her CT results today. She came in with right lower artery tenderness and pain that was radiating into her right leg. We ended up doing an abdominal and pelvic CT which revealed that she has a left kidney that sits in the hemipelvis and she did have some evidence of early diverticulitis. Her white count was normal and reassuring that I opted to go ahead and treat her with antibiotics. She also had a moderate amount of stool in the colon. She's been taking her metronidazole for about 5 days at this point. She has a few more days left. She denies any fever. She does feel her abdominal pain has improved somewhat but has not completely resolved. She feels like her bowel movements are normal.   Review of Systems     Objective:   Physical Exam  Constitutional: She is oriented to person, place, and time. She appears well-developed and well-nourished.  HENT:  Head: Normocephalic and atraumatic.  Cardiovascular: Normal rate, regular rhythm and normal heart sounds.   Pulmonary/Chest: Effort normal and breath sounds normal.  Abdominal: Soft. Bowel sounds are normal. She exhibits no distension and no mass. There is tenderness. There is no rebound and no guarding.  Mildly tender to palpation in the right lower alternate with deep palpation.  Musculoskeletal:  Right hip with normal flexion. She does have some discomfort with internal rotation. No significant discomfort with external rotation.  Neurological: She is alert and oriented to person, place, and time.  Skin: Skin is warm and dry.  Psychiatric: She has a normal mood and affect. Her behavior is normal.          Assessment & Plan:  I went over the CT scan results with her and gave her a copy. Next  Early diverticulitis-complete antibiotics. She is still mildly tender in the right lower quadrant so we need to extend her  antibiotics to 10 days we can. She's to call me when she gets close to the end of her current course.  Moderate stool burden-encouraged her to distal softener and really try to get her bowels moving and see if this can also provide some relief for her as well.  Right leg pain-I. do believe this could be completely unrelated to the right lower quadrant pain. Interestingly she did have some sign of moderate arthritis of the hip on her CT scan. She also has pain with internal rotation on exam. If the right lower quadrant pain resolves but the right leg pain does not then evaluate further. Consider referral to my partner Dr. Rodney Langton for evaluation of right hip osteoarthritis.  Pelvic kidney-reviewed this with her insurance her the location of her kidney on the imaging scan. She does have a cyst on the kidney as well as liver but these are small and are likely benign.

## 2012-08-01 ENCOUNTER — Other Ambulatory Visit: Payer: Self-pay | Admitting: Family Medicine

## 2012-08-07 ENCOUNTER — Ambulatory Visit: Payer: 59 | Admitting: Family Medicine

## 2012-08-14 ENCOUNTER — Ambulatory Visit: Payer: 59 | Admitting: Family Medicine

## 2012-08-18 ENCOUNTER — Ambulatory Visit: Payer: 59 | Admitting: Family Medicine

## 2012-10-29 ENCOUNTER — Encounter: Payer: Self-pay | Admitting: Family Medicine

## 2012-10-29 ENCOUNTER — Ambulatory Visit (INDEPENDENT_AMBULATORY_CARE_PROVIDER_SITE_OTHER): Payer: 59 | Admitting: Family Medicine

## 2012-10-29 VITALS — BP 137/75 | HR 85 | Ht 70.6 in | Wt 210.0 lb

## 2012-10-29 DIAGNOSIS — L723 Sebaceous cyst: Secondary | ICD-10-CM | POA: Diagnosis not present

## 2012-10-29 NOTE — Progress Notes (Signed)
  Subjective:    Patient ID: [name], female    DOB: Jul 06, 1945, 67 y.o.   MRN: [id number]  HPI  Cyst on back. Has been draining almost daily. Has an odor to it. No pain or redness or fever. Her husband squeezed on it about 2 weeks ago.    Review of Systems     Objective:   Physical Exam  seb cyst on upper back with expressable drainage and odor.  Approx 0.5 cm in width and diameter.        Assessment & Plan:  Sebaceous cyst inflammed/irritated - discussed tx options.    Sebaceous Cyst Excision Procedure Note  Pre-operative Diagnosis: Sebaceous cyst  Post-operative Diagnosis: same  Locations:upper back  Indications: inflammed  Anesthesia: Lidocaine 1% with epinephrine    Procedure Details  History of allergy to iodine: no  Patient informed of the risks (including bleeding and infection) and benefits of the  procedure and Verbal informed consent obtained.  The lesion and surrounding area was given a sterile prep using betadyne and draped in the usual sterile fashion. An full thickness skin incision was made over the cyst, which was dissected free of the surrounding tissue and removed.  The cyst was filled with typical sebaceous material.  The wound was closed with 5-0 Nylon using simple interrupted stitches. Antibiotic ointment and a sterile dressing applied.  The specimen was not sent for pathologic examination. The patient tolerated the procedure well.  EBL: 0.25 ml  Findings: seb cyst  Condition: Stable  Complications: none.  Plan: 1. Instructed to keep the wound dry and covered for 24-48h and clean thereafter. 2. Warning signs of infection were reviewed.   3. Recommended that the patient use OTC acetaminophen as needed for pain.  4. Return for suture removal.

## 2012-10-29 NOTE — Patient Instructions (Addendum)
Can remove bandage in 24 hours.  There may be some mild drainage for the first 48 hours. Says he may want to keep covered with gauze for the first couple of days. Cover it with a waterproof Band-Aid during showers. Avoid getting the actual incision or suture wet for at least the first 3-4 days. After that can apply a small dab Vaseline to the area. Followup in 10-14 days to have the suture removed. If there's any sign infection then please let me know.

## 2012-11-07 ENCOUNTER — Telehealth: Payer: Self-pay | Admitting: *Deleted

## 2012-11-07 NOTE — Telephone Encounter (Signed)
Pt called and wanted some instructions about the suture that was placed. I told her that it was ok for her to get it wet and she no longer needed to apply a bandage to the area. Pt voiced understanding and agreed.[name] Onida

## 2012-11-20 ENCOUNTER — Ambulatory Visit: Payer: 59 | Admitting: Family Medicine

## 2012-11-21 ENCOUNTER — Ambulatory Visit: Payer: 59 | Admitting: Family Medicine

## 2012-12-02 ENCOUNTER — Ambulatory Visit (INDEPENDENT_AMBULATORY_CARE_PROVIDER_SITE_OTHER): Payer: 59 | Admitting: Family Medicine

## 2012-12-02 ENCOUNTER — Encounter: Payer: Self-pay | Admitting: Family Medicine

## 2012-12-02 VITALS — BP 121/65 | HR 79 | Wt 211.0 lb

## 2012-12-02 DIAGNOSIS — Z23 Encounter for immunization: Secondary | ICD-10-CM | POA: Diagnosis not present

## 2012-12-02 NOTE — Progress Notes (Signed)
  Subjective:    Patient ID: [name], female    DOB: 08/20/1945, 67 y.o.   MRN: [id number]  HPI Here for suture removal after sedation cyst excision. She is doing well. She's not had any problems or drainage or fever or chills or erythema around that area.   Review of Systems     Objective:   Physical Exam        Assessment & Plan:  Suture removed. Patient tolerated well. He appears to be healing well. She does have some hyperpigmentation around the wean. If she feels that the cyst is recurring then please let us know.  Flu vaccine given today.

## 2013-03-24 ENCOUNTER — Encounter: Payer: Self-pay | Admitting: Family Medicine

## 2013-03-24 ENCOUNTER — Ambulatory Visit (INDEPENDENT_AMBULATORY_CARE_PROVIDER_SITE_OTHER): Payer: 59 | Admitting: Family Medicine

## 2013-03-24 VITALS — BP 163/79 | HR 88 | Temp 97.9°F | Wt 213.0 lb

## 2013-03-24 DIAGNOSIS — S76019A Strain of muscle, fascia and tendon of unspecified hip, initial encounter: Secondary | ICD-10-CM

## 2013-03-24 DIAGNOSIS — IMO0002 Reserved for concepts with insufficient information to code with codable children: Secondary | ICD-10-CM

## 2013-03-24 MED ORDER — CYCLOBENZAPRINE HCL 10 MG PO TABS: ORAL_TABLET | ORAL | Status: DC

## 2013-03-24 MED ORDER — MELOXICAM 15 MG PO TABS: 15.0000 mg | ORAL_TABLET | Freq: Every day | ORAL | Status: DC

## 2013-03-24 NOTE — Progress Notes (Signed)
CC: [name] is a 68 y.o. female is here for LLQ pain   Subjective: HPI:  Complains of left lower quadrant pain that is further localized in the left groin that has been present ever since early this morning. She noticed it after she woke up to urinate earlier this morning. She's pretty sure she did not wake up because of the pain. Pain has been persistent since onset mild to moderate in severity. Worse with standing or with walking. Improved with leaning forward or sitting. Also improves with Aleve. Does not seem to be related with dietary habits she denies lack of appetite or worse after eating. She denies any genitourinary complaints. She specifically denies urinary frequency urgency nor dysuria. Pain did not change during [place] or after a bowel movement which was normal and caliber and consistency per report earlier this morning. Denies melena or blood in stool. The pain is nonradiating described only has pain. She denies fevers, chills, flank pain, nausea, vomiting, chest pain shortness of breath nor motor or sensory disturbances in the left lower extremity   Review Of Systems Outlined In HPI  Past Medical History  Diagnosis Date  . Hypertension   . Hyperlipidemia     borderline  . Hiatal hernia   . Diverticulosis      Family History  Problem Relation Age of Onset  . Breast cancer Sister 2862    COD breast cancer and pneumonia  . Hypertension Sister   . Heart attack Father   . Hypertension Father   . Hypertension Mother   . Kidney disease Mother   . Hypertension Brother   . Colon cancer Neg Hx   . Esophageal cancer Neg Hx   . Rectal cancer Neg Hx   . Stomach cancer Neg Hx      History  Substance Use Topics  . Smoking status: Never Smoker   . Smokeless tobacco: Never Used  . Alcohol Use: No     Objective: Filed Vitals:   03/24/13 1626  BP: 163/79  Pulse: 88  Temp: 97.9 F (36.6 C)    General: Alert and Oriented, No Acute Distress HEENT: Pupils equal,  round, reactive to light. Conjunctivae clear.  Moist mucous membranes pharynx unremarkable Lungs: Clear to auscultation bilaterally, no wheezing/ronchi/rales.  Comfortable work of breathing. Good air movement. Cardiac: Regular rate and rhythm. Normal S1/S2.  No murmurs, rubs, nor gallops.   Abdomen: Obese and soft with normal bowel sounds. There are no palpable masses. Deep palpation reproduces pain in the left lower quadrant only just slightly medial to ASIS, there is no rebound or guarding.. pain is reproduced with long roll of the right leg. Extremities: No peripheral edema.  Strong peripheral pulses.  Mental Status: No depression, anxiety, nor agitation. Skin: Warm and dry.  Assessment & Plan: [name] was seen today for llq pain.  Diagnoses and associated orders for this visit:  Strain of hip flexor - meloxicam (MOBIC) 15 MG tablet; Take 1 tablet (15 mg total) by mouth daily. - cyclobenzaprine (FLEXERIL) 10 MG tablet; Take a half to a full tab at bedtime to help avoid left groin pain.    Low suspicion of intra-abdominal process suspect this is more due to hip flexor strain therefore focus on home exercise rehabilitation/stretching I provided her with along with meloxicam and as needed Flexeril. Signs and symptoms requring emergent/urgent reevaluation were discussed with the patient with emphasis placed on fevers, chills, sweats, nausea, diarrhea, or worsening pain Return if symptoms worsen or fail to improve.

## 2013-03-26 DIAGNOSIS — R109 Unspecified abdominal pain: Secondary | ICD-10-CM | POA: Diagnosis not present

## 2013-03-26 DIAGNOSIS — Z880 Allergy status to penicillin: Secondary | ICD-10-CM | POA: Diagnosis not present

## 2013-03-26 DIAGNOSIS — I10 Essential (primary) hypertension: Secondary | ICD-10-CM | POA: Diagnosis not present

## 2013-03-26 DIAGNOSIS — R1032 Left lower quadrant pain: Secondary | ICD-10-CM | POA: Diagnosis not present

## 2013-03-26 DIAGNOSIS — K5730 Diverticulosis of large intestine without perforation or abscess without bleeding: Secondary | ICD-10-CM | POA: Diagnosis not present

## 2013-03-26 DIAGNOSIS — Z7982 Long term (current) use of aspirin: Secondary | ICD-10-CM | POA: Diagnosis not present

## 2013-03-26 DIAGNOSIS — Z79899 Other long term (current) drug therapy: Secondary | ICD-10-CM | POA: Diagnosis not present

## 2013-03-27 ENCOUNTER — Other Ambulatory Visit: Payer: Self-pay | Admitting: Family Medicine

## 2013-04-01 ENCOUNTER — Other Ambulatory Visit: Payer: Self-pay | Admitting: Family Medicine

## 2013-04-01 DIAGNOSIS — Z1231 Encounter for screening mammogram for malignant neoplasm of breast: Secondary | ICD-10-CM

## 2013-04-03 ENCOUNTER — Ambulatory Visit: Payer: 59 | Admitting: Family Medicine

## 2013-04-06 ENCOUNTER — Ambulatory Visit: Payer: 59 | Admitting: Family Medicine

## 2013-04-07 ENCOUNTER — Encounter: Payer: Self-pay | Admitting: Family Medicine

## 2013-04-07 ENCOUNTER — Ambulatory Visit (INDEPENDENT_AMBULATORY_CARE_PROVIDER_SITE_OTHER): Payer: 59 | Admitting: Family Medicine

## 2013-04-07 VITALS — BP 156/78 | HR 88 | Wt 209.0 lb

## 2013-04-07 DIAGNOSIS — M51369 Other intervertebral disc degeneration, lumbar region without mention of lumbar back pain or lower extremity pain: Secondary | ICD-10-CM | POA: Insufficient documentation

## 2013-04-07 DIAGNOSIS — M5137 Other intervertebral disc degeneration, lumbosacral region: Secondary | ICD-10-CM

## 2013-04-07 DIAGNOSIS — M5136 Other intervertebral disc degeneration, lumbar region: Secondary | ICD-10-CM | POA: Insufficient documentation

## 2013-04-07 MED ORDER — PREDNISONE 20 MG PO TABS: ORAL_TABLET | ORAL | Status: AC

## 2013-04-07 NOTE — Progress Notes (Signed)
CC: [name] is a 68 y.o. female is here for f/u [name]   Subjective: HPI:  Emergency room followup for left lower quadrant pain. She tells that since she's only lasted has been no improvement or deterioration in her left lower quadrant pain that is described as a mild/moderate nevus is present all hours of the day interfering with sleep. She's unable to reproduce the symptoms and she cannot find a physician that alleviates the symptoms. Pain is localized deep in the pelvis radiates somewhat down the anterior left lower quadrant and into the upper lateral thigh. This is been present for matter of weeks now and overall not getting better or worse. It has not responded to tramadol nor Mobic. Unable to determine how much of a home exercise plan she was able to participate in the provided her at her last visit. She had quite a workup that [facility] including CBC with mild anemia, metabolic panel with mild elevation of creatinine and slightly depressed potassium. Urinalysis unremarkable as of today her culture there was negative. CT abdomen pelvis with a relevant abnormality of the T12-L1 left-sided bone spur with a likelihood of impinging on left nerve root.  She currently denies fevers, chills, GI disturbance, genitourinary complaints, midline back pain nor any motor or sensory disturbances in the left lower extremity other than pain described above   Review Of Systems Outlined In HPI  Past Medical History  Diagnosis Date  . Hypertension   . Hyperlipidemia     borderline  . Hiatal hernia   . Diverticulosis     Past Surgical History  Procedure Laterality Date  . Complete hysterectomy  1976  . Breast reduction surgery  1994   Family History  Problem Relation Age of Onset  . Breast cancer Sister 67    COD breast cancer and pneumonia  . Hypertension Sister   . Heart attack Father   . Hypertension Father   . Hypertension Mother   . Kidney disease Mother   . Hypertension Brother   . Colon  cancer Neg Hx   . Esophageal cancer Neg Hx   . Rectal cancer Neg Hx   . Stomach cancer Neg Hx     History   Social History  . Marital Status: Married    Spouse Name: N/A    Number of Children: 0  . Years of Education: N/A   Occupational History  . Not on file.   Social History Main Topics  . Smoking status: Never Smoker   . Smokeless tobacco: Never Used  . Alcohol Use: No  . Drug Use: No  . Sexual Activity: No   Other Topics Concern  . Not on file   Social History Narrative   Working out some.      Objective: BP 156/78  Pulse 88  Wt 209 lb (94.802 kg)  General: Alert and Oriented, No Acute Distress HEENT: Pupils equal, round, reactive to light. Conjunctivae clear.  Moist membranes pharynx unremarkable Lungs: Clear to auscultation bilaterally, no wheezing/ronchi/rales.  Comfortable work of breathing. Good air movement. Cardiac: Regular rate and rhythm. Normal S1/S2.  No murmurs, rubs, nor gallops.   Abdomen: Normal bowel sounds, soft and non tender without palpable masses. No rebound or guarding Extremities: No peripheral edema.  Strong peripheral pulses. She has full range of motion strength in both lower extremities  L4 and S1 DTRs one over four bilaterally and symmetric Mental Status: No depression, anxiety, nor agitation. Skin: Warm and dry.  Assessment & Plan: [name] was  seen today for f/u [facility]c.  Diagnoses and associated orders for this visit:  Lumbar degenerative disc disease - predniSONE (DELTASONE) 20 MG tablet; Three tabs daily days 1-3, two tabs daily days 4-6, one tab daily days 7-9, half tab daily days 10-13.    CT scan and symptoms are highly suspicious for radiculitis therefore start prednisone and continue with home exercise plan given at her prior visit. Update me again of the week with any response if not improving will refer to [name]. for further evaluation and sports medicine  25 minutes spent face-to-face during visit today of which at least  50% was counseling or coordinating care regarding: 1. Lumbar degenerative disc disease      Return if symptoms worsen or fail to improve.

## 2013-04-09 ENCOUNTER — Telehealth: Payer: Self-pay | Admitting: *Deleted

## 2013-04-09 ENCOUNTER — Ambulatory Visit: Payer: 59 | Admitting: Family Medicine

## 2013-04-09 NOTE — Telephone Encounter (Signed)
Pt called to give you an update.  She wants you know that she is feeling good & that she slept like a baby Tuesday night.

## 2013-04-21 ENCOUNTER — Encounter: Payer: Self-pay | Admitting: Women's Health

## 2013-04-21 ENCOUNTER — Ambulatory Visit (HOSPITAL_COMMUNITY): Payer: 59

## 2013-06-18 ENCOUNTER — Ambulatory Visit (INDEPENDENT_AMBULATORY_CARE_PROVIDER_SITE_OTHER): Payer: 59 | Admitting: Women's Health

## 2013-06-18 ENCOUNTER — Ambulatory Visit (HOSPITAL_COMMUNITY)
Admission: RE | Admit: 2013-06-18 | Discharge: 2013-06-18 | Disposition: A | Discharge status: Home / Self Care | Payer: 59 | Source: Ambulatory Visit | Attending: Family Medicine | Admitting: Family Medicine

## 2013-06-18 ENCOUNTER — Encounter: Payer: Self-pay | Admitting: Women's Health

## 2013-06-18 VITALS — BP 130/70 | Ht 69.0 in | Wt 210.0 lb

## 2013-06-18 DIAGNOSIS — Z1231 Encounter for screening mammogram for malignant neoplasm of breast: Secondary | ICD-10-CM

## 2013-06-18 DIAGNOSIS — Z01419 Encounter for gynecological examination (general) (routine) without abnormal findings: Secondary | ICD-10-CM

## 2013-06-18 NOTE — Progress Notes (Signed)
[name] [birth year] [id number]    History:    Presents for annual exam with complaints of L shoulder and R knee pain. Normal bone density 2013 T score 3.8 at spine, bilateral hip average 1.7. TAH for fibroids, no HRT, no bleeding. Hypertension, hypercholesterolemia managed by primary care. Normal colonoscopy 2013. History of normal Paps and mammograms. Breast reduction 1994,  increased in size since reduction.  Past medical history, past surgical history, family history and social history were all reviewed and documented in the EPIC chart. 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 Husband still working, in bad health. Sister died at 4862 of breast cancer. Mother/father/sister/brother hypertension. Has had liposuction. Up to date on vaccines, unsure of zostavac.   ROS:  A  12 point ROS was performed and pertinent positives and negatives are included.  Exam:  Filed Vitals:   06/18/13 0921  BP: 130/70    General appearance:  Normal Thyroid:  Symmetrical, normal in size, without palpable masses or nodularity. Respiratory  Auscultation:  Clear without wheezing or rhonchi Cardiovascular  Auscultation:  Regular rate, without rubs, murmurs or gallops  Edema/varicosities:  Not grossly evident Abdominal  Soft,nontender, without masses, guarding or rebound.  Liver/spleen:  No organomegaly noted  Hernia:  None appreciated  Skin  Inspection:  Grossly normal   Breasts: Examined lying and sitting. Pendulous.     Right: Without masses, retractions, discharge or axillary adenopathy.     Left: Without masses, retractions, discharge or axillary adenopathy. Gentitourinary   Inguinal/mons:  Normal without inguinal adenopathy  External genitalia:  Normal  BUS/Urethra/Skene's glands:  Normal  Vagina:  Normal  Cervix:  Absent   Uterus:  absent  Adnexa/parametria:     Rt: Without masses or tenderness.   Lt: Without masses or tenderness.  Anus and perineum: Normal  Digital rectal exam: Normal  sphincter tone without palpated masses or tenderness  Assessment/Plan:  68 y.o. M BF G0 for annual exam.   Normal postmenopausal exam no HRT  Hypertension primary care labs and meds  Normal DEXA 04/2011  Negative colonoscopy 2013  Plan: SBE's, continue annual mammogram, scheduled today, increase regular exercise, just purchased tredmill, decrease calories for weight loss. Vitamin D 2000 daily encouraged, home safety and fall prevention discussed. Follow up with PC for joint pain. Pap screening guidelines reviewed.   Harrington Challengerancy J Zaydee Aina [facility] Center[facility]NP, 10:02 AM 06/18/2013

## 2013-06-19 LAB — URINALYSIS W MICROSCOPIC + REFLEX CULTURE
BILIRUBIN URINE: NEGATIVE
CASTS: NONE SEEN
Crystals: NONE SEEN
GLUCOSE, UA: NEGATIVE mg/dL
HGB URINE DIPSTICK: NEGATIVE
KETONES UR: NEGATIVE mg/dL
Nitrite: POSITIVE — AB
PH: 6.5 (ref 5.0–8.0)
Protein, ur: NEGATIVE mg/dL
Specific Gravity, Urine: 1.02 (ref 1.005–1.030)
Squamous Epithelial / LPF: NONE SEEN
Urobilinogen, UA: 0.2 mg/dL (ref 0.0–1.0)

## 2013-06-21 LAB — URINE CULTURE: Colony Count: >=100000

## 2013-06-22 ENCOUNTER — Other Ambulatory Visit: Payer: Self-pay | Admitting: *Deleted

## 2013-06-22 MED ORDER — CIPROFLOXACIN HCL 250 MG PO TABS: 250.0000 mg | ORAL_TABLET | Freq: Two times a day (BID) | ORAL | Status: DC

## 2013-07-17 ENCOUNTER — Ambulatory Visit: Payer: Medicare Other | Admitting: Family Medicine

## 2013-07-21 ENCOUNTER — Encounter: Payer: Self-pay | Admitting: Family Medicine

## 2013-07-21 ENCOUNTER — Ambulatory Visit (INDEPENDENT_AMBULATORY_CARE_PROVIDER_SITE_OTHER): Payer: 59 | Admitting: Family Medicine

## 2013-07-21 VITALS — BP 124/66 | HR 90 | Ht 69.0 in | Wt 210.0 lb

## 2013-07-21 DIAGNOSIS — M25519 Pain in unspecified shoulder: Secondary | ICD-10-CM

## 2013-07-21 DIAGNOSIS — M25561 Pain in right knee: Secondary | ICD-10-CM

## 2013-07-21 DIAGNOSIS — I10 Essential (primary) hypertension: Secondary | ICD-10-CM | POA: Diagnosis not present

## 2013-07-21 DIAGNOSIS — M5137 Other intervertebral disc degeneration, lumbosacral region: Secondary | ICD-10-CM

## 2013-07-21 DIAGNOSIS — M25569 Pain in unspecified knee: Secondary | ICD-10-CM | POA: Diagnosis not present

## 2013-07-21 DIAGNOSIS — M25512 Pain in left shoulder: Secondary | ICD-10-CM

## 2013-07-21 MED ORDER — METAXALONE 800 MG PO TABS: 800.0000 mg | ORAL_TABLET | Freq: Three times a day (TID) | ORAL | Status: DC

## 2013-07-21 MED ORDER — MELOXICAM 15 MG PO TABS: 15.0000 mg | ORAL_TABLET | Freq: Every day | ORAL | Status: DC

## 2013-07-21 NOTE — Patient Instructions (Addendum)
Please schedule an appointment with Dr. Thekkekandam for your shoulder. 

## 2013-07-21 NOTE — Progress Notes (Signed)
   Subjective:    Patient ID: [name], female    DOB: Aug 18, 1945, 68 y.o.   MRN: [id number]  HPI Hypertension- Pt denies chest pain, SOB, dizziness, or heart palpitations.  Taking meds as directed w/o problems.  Denies medication side effects.    Knee Pain -worse in the morning when she first gets out of bed. She actually usually has to sit back down to rest the joint. She's not sure if it could be coming from her back because she does have a prior history of back problems. Worse if gets up after sitting for awhile.  Better if more active.  No swelling.  Hurts on right low back near outside hip and radiates to the knee.  Back is stff in the am o nthe right. She has been taking glucosamine for her joint.   Lefts shoulder pain and decreased ROM for the last several week. No trauma or injury.  Can sleep on that shoulder. She says she's not able to reach behind her back as well as with her other arm. Review of Systems     Objective:   Physical Exam  Constitutional: She is oriented to person, place, and time. She appears well-developed and well-nourished.  HENT:  Head: Normocephalic and atraumatic.  Cardiovascular: Normal rate, regular rhythm and normal heart sounds.   Pulmonary/Chest: Effort normal and breath sounds normal.  Neurological: She is alert and oriented to person, place, and time.  Skin: Skin is warm and dry.  Psychiatric: She has a normal mood and affect. Her behavior is normal.   Left shoulder with normal extension. Decreased internal rotation compared to her right shoulder. Strength is 5 out of 5 at the shoulder elbow and wrist. Negative empty can test bilaterally. Slight decreased external rotation on the left compared to the right. Nontender over the joint itself. No swelling or rash.  Lumbar spine is nontender. No SI joint tenderness. Hip knee and ankle strength is 5 out of 5 bilaterally. Right knee with normal range of motion. Some mild crepitus. No increased laxity  of the joint with anterior-posterior drawer. Patellar reflexes 1+ bilaterally.       Assessment & Plan:  HTn - well-controlled on current regimen.F/U in 6 months.    Lumbar DDD - having a lot of morning stiffness.  Chest CT of the abdomen done about a year ago and showed some significant degenerative disc disease in the lumbar spine as well as some osteoarthritis. At this point I would like to put her in to physical therapy to see how much day-to-day functioning a rehabilitation we can achieve. Sounds like she's having a lot of spasm and stiffness especially in the morning so we could certainly work on this. We can add a muscle x-ray bedtime as needed.  Right knee pain- Given some exercises for knee. History of osteoarthritis seen on x-ray 4 years ago. There is no swelling today on exam and really her exam is completely normal. No laxity or tenderness. She says most of her discomfort usually is behind the knee when she experiences it.  Left shoulder pain-most consistent with possible frozen shoulder.  Will refer to Dr. Benjamin Stain for further evaluation.

## 2013-07-24 ENCOUNTER — Other Ambulatory Visit: Payer: Self-pay | Admitting: *Deleted

## 2013-07-24 MED ORDER — CIPROFLOXACIN HCL 250 MG PO TABS: 250.0000 mg | ORAL_TABLET | Freq: Two times a day (BID) | ORAL | Status: DC

## 2013-07-27 ENCOUNTER — Ambulatory Visit (INDEPENDENT_AMBULATORY_CARE_PROVIDER_SITE_OTHER): Payer: 59 | Admitting: Sports Medicine

## 2013-07-27 ENCOUNTER — Encounter: Payer: Self-pay | Admitting: Sports Medicine

## 2013-07-27 ENCOUNTER — Ambulatory Visit (INDEPENDENT_AMBULATORY_CARE_PROVIDER_SITE_OTHER): Payer: 59

## 2013-07-27 VITALS — BP 144/67 | HR 92 | Wt 212.0 lb

## 2013-07-27 DIAGNOSIS — M25519 Pain in unspecified shoulder: Secondary | ICD-10-CM | POA: Diagnosis not present

## 2013-07-27 DIAGNOSIS — M898X9 Other specified disorders of bone, unspecified site: Secondary | ICD-10-CM

## 2013-07-27 DIAGNOSIS — M1710 Unilateral primary osteoarthritis, unspecified knee: Secondary | ICD-10-CM | POA: Diagnosis not present

## 2013-07-27 DIAGNOSIS — M25561 Pain in right knee: Secondary | ICD-10-CM

## 2013-07-27 DIAGNOSIS — IMO0002 Reserved for concepts with insufficient information to code with codable children: Secondary | ICD-10-CM | POA: Diagnosis not present

## 2013-07-27 DIAGNOSIS — M1120 Other chondrocalcinosis, unspecified site: Secondary | ICD-10-CM

## 2013-07-27 DIAGNOSIS — M19019 Primary osteoarthritis, unspecified shoulder: Secondary | ICD-10-CM | POA: Diagnosis not present

## 2013-07-27 DIAGNOSIS — M25569 Pain in unspecified knee: Secondary | ICD-10-CM | POA: Diagnosis not present

## 2013-07-27 DIAGNOSIS — M25469 Effusion, unspecified knee: Secondary | ICD-10-CM

## 2013-07-27 DIAGNOSIS — M25512 Pain in left shoulder: Secondary | ICD-10-CM | POA: Insufficient documentation

## 2013-07-27 DIAGNOSIS — M170 Bilateral primary osteoarthritis of knee: Secondary | ICD-10-CM | POA: Insufficient documentation

## 2013-07-27 NOTE — Assessment & Plan Note (Signed)
This likely represents early osteoarthritis. Continue over-the-counter anti-inflammatories, x-rays, formal physical therapy. If no better in a month we will inject her knee.

## 2013-07-27 NOTE — Progress Notes (Signed)
   Subjective:    I'm seeing this patient as a consultation for:  Dr. Linford Arnold  CC: Left shoulder pain  HPI: Left shoulder pain: Present for several weeks now, moderate, persistent, over the deltoid, principal complaint is th decrease in range of motion. Pain is significantly bad with internal rotation. Moderate, persistent.  Right knee pain: Stiff in the mornings, at the joint line, moderate, persistent, no mechanical symptoms, and no trauma.  Past medical history, Surgical history, Family history not pertinant except as noted below, Social history, Allergies, and medications have been entered into the medical record, reviewed, and no changes needed.   Review of Systems: No headache, visual changes, nausea, vomiting, diarrhea, constipation, dizziness, abdominal pain, skin rash, fevers, chills, night sweats, weight loss, swollen lymph nodes, body aches, joint swelling, muscle aches, chest pain, shortness of breath, mood changes, visual or auditory hallucinations.   Objective:   General: Well Developed, well nourished, and in no acute distress.  Neuro/Psych: Alert and oriented x3, extra-ocular muscles intact, able to move all 4 extremities, sensation grossly intact. Skin: Warm and dry, no rashes noted.  Respiratory: Not using accessory muscles, speaking in full sentences, trachea midline.  Cardiovascular: Pulses palpable, no extremity edema. Abdomen: Does not appear distended. Left Shoulder: Inspection reveals no abnormalities, atrophy or asymmetry. Palpation is normal with no tenderness over AC joint or bicipital groove. ROM is full in all planes. External rotation to about 50, internal rotation to upper lumbar spine. This is fairly symmetric with the contralateral side. Rotator cuff strength normal throughout. No signs of impingement with negative Neer and Hawkin's tests, empty can sign. Speeds and Yergason's tests normal. Positive crank test suggesting glenohumeral pathology. Normal  scapular function observed. No painful arc and no drop arm sign. No apprehension sign Right Knee: Normal to inspection with no erythema or effusion or obvious bony abnormalities. Minimally tender to palpation at the joint lines but no swelling or effusion. ROM full in flexion and extension and lower leg rotation. Ligaments with solid consistent endpoints including ACL, PCL, LCL, MCL. Negative Mcmurray's, Apley's, and Thessalonian tests. Non painful patellar compression. Patellar glide without crepitus. Patellar and quadriceps tendons unremarkable. Hamstring and quadriceps strength is normal.   Procedure: Real-time Ultrasound Guided Injection of left glenohumeral joint Device: GE Logiq E  Verbal informed consent obtained.  Time-out conducted.  Noted no overlying erythema, induration, or other signs of local infection.  Skin prepped in a sterile fashion.  Local anesthesia: Topical Ethyl chloride.  With sterile technique and under real time ultrasound guidance:  Spinal needle advanced into the joint taking care to avoid the labrum, 1 cc Kenalog 40, 4 cc lidocaine injected easily. Completed without difficulty  Pain immediately resolved suggesting accurate placement of the medication.  Advised to call if fevers/chills, erythema, induration, drainage, or persistent bleeding.  Images permanently stored and available for review in the ultrasound unit.  Impression: Technically successful ultrasound guided injection.   Impression and Recommendations:   This case required medical decision making of moderate complexity.

## 2013-07-27 NOTE — Assessment & Plan Note (Signed)
Symptoms are predominantly glenohumeral but range of motion is not decreased enough for me to consider adhesive capsulitis Glenohumeral injection as above, formal physical therapy. X-rays. Return in a month. Her predominant complaint is lack of range of motion.

## 2013-07-31 ENCOUNTER — Ambulatory Visit: Payer: 59 | Admitting: Physical Therapy

## 2013-08-04 ENCOUNTER — Ambulatory Visit: Payer: 59

## 2013-08-12 ENCOUNTER — Ambulatory Visit (INDEPENDENT_AMBULATORY_CARE_PROVIDER_SITE_OTHER): Payer: 59 | Admitting: Family Medicine

## 2013-08-12 ENCOUNTER — Encounter: Payer: Self-pay | Admitting: Family Medicine

## 2013-08-12 VITALS — BP 138/64 | HR 92 | Wt 211.0 lb

## 2013-08-12 DIAGNOSIS — J309 Allergic rhinitis, unspecified: Secondary | ICD-10-CM | POA: Diagnosis not present

## 2013-08-12 DIAGNOSIS — J302 Other seasonal allergic rhinitis: Secondary | ICD-10-CM

## 2013-08-12 MED ORDER — METHYLPREDNISOLONE ACETATE 80 MG/ML IJ SUSP
80.0000 mg | Freq: Once | INTRAMUSCULAR | Status: AC
  Administered 2013-08-12: 80 mg via INTRAMUSCULAR

## 2013-08-12 NOTE — Progress Notes (Signed)
CC: [name] is a 68 y.o. female is here for Allergies   Subjective: HPI:  Complaint of nasal congestion severe in severity, present since Saturday, presently daily basis only mildly improved today without any intervention other than Alka-Seltzer cold and sinus. Accompanied by nonproductive cough. Symptoms came on gradually and have been present all hours of the day. Denies fevers, chills, wheezing, bloody sputum, facial pain, headache, rashes.  She was to avoid taking any oral medication if at all possible.   Review Of Systems Outlined In HPI  Past Medical History  Diagnosis Date  . Hypertension   . Hyperlipidemia     borderline  . Hiatal hernia   . Diverticulosis     Past Surgical History  Procedure Laterality Date  . Breast reduction surgery  1994  . Breast surgery      Reduction  . Abdominal hysterectomy      TAH Pt. unsure if BSO   Family History  Problem Relation Age of Onset  . Breast cancer Sister [id number]    COD breast cancer and pneumonia  . Hypertension Sister   . Heart attack Father   . Hypertension Father   . Hypertension Mother   . Kidney disease Mother   . Hypertension Brother   . Colon cancer Neg Hx   . Esophageal cancer Neg Hx   . Rectal cancer Neg Hx   . Stomach cancer Neg Hx     History   Social History  . Marital Status: Married    Spouse Name: N/A    Number of Children: 0  . Years of Education: N/A   Occupational History  . Not on file.   Social History Main Topics  . Smoking status: Never Smoker   . Smokeless tobacco: Never Used  . Alcohol Use: No  . Drug Use: No  . Sexual Activity: Yes    Partners: Male     Comment: [facility]   Other Topics Concern  . Not on file   Social History Narrative   Working out some.      Objective: BP 138/64  Pulse 92  Wt 211 lb (95.709 kg)  General: Alert and Oriented, No Acute Distress HEENT: Pupils equal, round, reactive to light. Conjunctivae clear.  External ears unremarkable, canals clear  with intact TMs with appropriate landmarks.  Middle ear appears open without effusion. Pale inferior turbinates which are boggy with moderate mucoid discharge.  Moist mucous membranes, pharynx without inflammation nor lesions.  Neck supple without palpable lymphadenopathy nor abnormal masses. Lungs: Clear to auscultation bilaterally, no wheezing/ronchi/rales.  Comfortable work of breathing. Good air movement. Cardiac: Regular rate and rhythm. Normal S1/S2.  No murmurs, rubs, nor gallops.   Mental Status: No depression, anxiety, nor agitation. Skin: Warm and dry.  Assessment & Plan: [name] was seen today for allergies.  Diagnoses and associated orders for this visit:  Seasonal allergies  Other Orders - methylPREDNISolone acetate (DEPO-MEDROL) injection 80 mg; Inject 1 mL (80 mg total) into the muscle once.    Seasonal allergies: Start Mucinex DM, since she does not want to take any antihistamines or any other oral medications other than above will provide her with 80 mg of Depo-Medrol today. If symptoms return start Zyrtec   Return if symptoms worsen or fail to improve.

## 2013-08-24 ENCOUNTER — Ambulatory Visit: Payer: Medicare Other | Admitting: Sports Medicine

## 2013-09-30 ENCOUNTER — Other Ambulatory Visit: Payer: Self-pay | Admitting: Family Medicine

## 2013-11-17 ENCOUNTER — Ambulatory Visit (INDEPENDENT_AMBULATORY_CARE_PROVIDER_SITE_OTHER): Payer: 59 | Admitting: Family Medicine

## 2013-11-17 VITALS — Temp 97.3°F

## 2013-11-17 DIAGNOSIS — Z23 Encounter for immunization: Secondary | ICD-10-CM

## 2013-11-17 NOTE — Progress Notes (Signed)
   Subjective:    Patient ID: [name], female    DOB: 05/25/1945, 68 y.o.   MRN: [id number]  HPI Requesting Flu vaccination.    Review of Systems     Objective:   Physical Exam        Assessment & Plan:  Patient tolerated injection well without complications.

## 2014-01-08 ENCOUNTER — Ambulatory Visit (INDEPENDENT_AMBULATORY_CARE_PROVIDER_SITE_OTHER): Payer: 59 | Admitting: Sports Medicine

## 2014-01-08 ENCOUNTER — Encounter: Payer: Self-pay | Admitting: Sports Medicine

## 2014-01-08 VITALS — BP 134/70 | HR 93 | Ht 70.0 in | Wt 216.0 lb

## 2014-01-08 DIAGNOSIS — M25561 Pain in right knee: Secondary | ICD-10-CM | POA: Diagnosis not present

## 2014-01-08 DIAGNOSIS — M1711 Unilateral primary osteoarthritis, right knee: Secondary | ICD-10-CM

## 2014-01-08 DIAGNOSIS — M25512 Pain in left shoulder: Secondary | ICD-10-CM

## 2014-01-08 NOTE — Assessment & Plan Note (Addendum)
Ultrasound guided injection as above. Formal physical therapy. Did not respond sufficiently to NSAIDs. Return in one month, Visco supplementation if no better.

## 2014-01-08 NOTE — Progress Notes (Signed)
  Subjective:    CC: right knee pain  HPI: Right knee osteoarthritis: This is a pleasant 68 year old female with known right knee osteoarthritis, she unfortunately failed NSAIDs, did not do any physical therapy.  pain is moderate, persistent localized laterally.  Left shoulder pain: Resolved after glenohumeral injection 6 months ago.  Past medical history, Surgical history, Family history not pertinant except as noted below, Social history, Allergies, and medications have been entered into the medical record, reviewed, and no changes needed.   Review of Systems: No fevers, chills, night sweats, weight loss, chest pain, or shortness of breath.   Objective:    General: Well Developed, well nourished, and in no acute distress.  Neuro: Alert and oriented x3, extra-ocular muscles intact, sensation grossly intact.  HEENT: Normocephalic, atraumatic, pupils equal round reactive to light, neck supple, no masses, no lymphadenopathy, thyroid nonpalpable.  Skin: Warm and dry, no rashes. Cardiac: Regular rate and rhythm, no murmurs rubs or gallops, no lower extremity edema.  Respiratory: Clear to auscultation bilaterally. Not using accessory muscles, speaking in full sentences. Right Knee: Mild effusion with a palpable fluid wave and tenderness at the lateral patellar facet. ROM normal in flexion and extension and lower leg rotation. Ligaments with solid consistent endpoints including ACL, PCL, LCL, MCL. Negative Mcmurray's and provocative meniscal tests. Non painful patellar compression. Patellar and quadriceps tendons unremarkable. Hamstring and quadriceps strength is normal.  Procedure: Real-time Ultrasound Guided Injection of right knee Device: GE Logiq E  Verbal informed consent obtained.  Time-out conducted.  Noted no overlying erythema, induration, or other signs of local infection.  Skin prepped in a sterile fashion.  Local anesthesia: Topical Ethyl chloride.  With sterile technique  and under real time ultrasound guidance:  2 mL kenalog 40, 4 mL lidocaine injected easily into the suprapatellar recess. Completed without difficulty  Pain immediately resolved suggesting accurate placement of the medication.  Advised to call if fevers/chills, erythema, induration, drainage, or persistent bleeding.  Images permanently stored and available for review in the ultrasound unit.  Impression: Technically successful ultrasound guided injection.  Impression and Recommendations:

## 2014-01-08 NOTE — Assessment & Plan Note (Signed)
Resolved after glenohumeral injection 5 months ago.

## 2014-01-15 ENCOUNTER — Ambulatory Visit (INDEPENDENT_AMBULATORY_CARE_PROVIDER_SITE_OTHER): Payer: 59 | Admitting: Physical Therapy

## 2014-01-15 DIAGNOSIS — M25519 Pain in unspecified shoulder: Secondary | ICD-10-CM | POA: Diagnosis not present

## 2014-01-15 DIAGNOSIS — M25561 Pain in right knee: Secondary | ICD-10-CM

## 2014-01-15 DIAGNOSIS — M2560 Stiffness of unspecified joint, not elsewhere classified: Secondary | ICD-10-CM

## 2014-01-15 DIAGNOSIS — M6281 Muscle weakness (generalized): Secondary | ICD-10-CM

## 2014-01-25 ENCOUNTER — Encounter: Payer: Self-pay | Admitting: Family Medicine

## 2014-01-25 ENCOUNTER — Ambulatory Visit (INDEPENDENT_AMBULATORY_CARE_PROVIDER_SITE_OTHER): Payer: 59 | Admitting: Family Medicine

## 2014-01-25 ENCOUNTER — Encounter (INDEPENDENT_AMBULATORY_CARE_PROVIDER_SITE_OTHER): Payer: 59 | Admitting: Physical Therapy

## 2014-01-25 VITALS — BP 130/82 | HR 85 | Wt 218.0 lb

## 2014-01-25 DIAGNOSIS — M25561 Pain in right knee: Secondary | ICD-10-CM | POA: Diagnosis present

## 2014-01-25 DIAGNOSIS — M2560 Stiffness of unspecified joint, not elsewhere classified: Secondary | ICD-10-CM

## 2014-01-25 DIAGNOSIS — M6281 Muscle weakness (generalized): Secondary | ICD-10-CM

## 2014-01-25 DIAGNOSIS — I10 Essential (primary) hypertension: Secondary | ICD-10-CM

## 2014-01-25 NOTE — Progress Notes (Signed)
   Subjective:    Patient ID: [name], female    DOB: 1946/01/26, 68 y.o.   MRN: [id number]  HPI  Hypertension- Pt denies chest pain, SOB, dizziness, or heart palpitations.  Taking meds as directed w/o problems.  Denies medication side effects.     . Says the last couple of weeks have been hectic. She has been struggling with her knee. Started PT.  excessive use of the medication.   Review of Systems     Objective:   Physical Exam  Constitutional: She is oriented to person, place, and time. She appears well-developed and well-nourished.  HENT:  Head: Normocephalic and atraumatic.  Cardiovascular: Normal rate, regular rhythm and normal heart sounds.   Pulmonary/Chest: Effort normal and breath sounds normal.  Neurological: She is alert and oriented to person, place, and time.  Skin: Skin is warm and dry.  Psychiatric: She has a normal mood and affect. Her behavior is normal.          Assessment & Plan:  HTN - well controlled. F/U in 6 months. Due for BMP and lipids.

## 2014-01-27 ENCOUNTER — Encounter (INDEPENDENT_AMBULATORY_CARE_PROVIDER_SITE_OTHER): Payer: 59 | Admitting: Physical Therapy

## 2014-01-27 DIAGNOSIS — M6281 Muscle weakness (generalized): Secondary | ICD-10-CM | POA: Diagnosis not present

## 2014-01-27 DIAGNOSIS — M25561 Pain in right knee: Secondary | ICD-10-CM

## 2014-01-27 DIAGNOSIS — M2560 Stiffness of unspecified joint, not elsewhere classified: Secondary | ICD-10-CM | POA: Diagnosis not present

## 2014-02-03 ENCOUNTER — Encounter: Payer: 59 | Admitting: Physical Therapy

## 2014-02-05 ENCOUNTER — Encounter: Payer: Self-pay | Admitting: Sports Medicine

## 2014-02-05 ENCOUNTER — Encounter: Payer: 59 | Admitting: Physical Therapy

## 2014-02-05 ENCOUNTER — Ambulatory Visit (INDEPENDENT_AMBULATORY_CARE_PROVIDER_SITE_OTHER): Payer: 59 | Admitting: Sports Medicine

## 2014-02-05 VITALS — BP 161/82 | HR 89 | Wt 218.0 lb

## 2014-02-05 DIAGNOSIS — M1711 Unilateral primary osteoarthritis, right knee: Secondary | ICD-10-CM | POA: Diagnosis not present

## 2014-02-05 NOTE — Progress Notes (Signed)
  Subjective:    CC: Follow-up  HPI: Right knee osteoarthritis: Pain resolved after physical therapy and injection. Happy with results.  Past medical history, Surgical history, Family history not pertinant except as noted below, Social history, Allergies, and medications have been entered into the medical record, reviewed, and no changes needed.   Review of Systems: No fevers, chills, night sweats, weight loss, chest pain, or shortness of breath.   Objective:    General: Well Developed, well nourished, and in no acute distress.  Neuro: Alert and oriented x3, extra-ocular muscles intact, sensation grossly intact.  HEENT: Normocephalic, atraumatic, pupils equal round reactive to light, neck supple, no masses, no lymphadenopathy, thyroid nonpalpable.  Skin: Warm and dry, no rashes. Cardiac: Regular rate and rhythm, no murmurs rubs or gallops, no lower extremity edema.  Respiratory: Clear to auscultation bilaterally. Not using accessory muscles, speaking in full sentences. Right Knee: Normal to inspection with no erythema or effusion or obvious bony abnormalities. Palpation normal with no warmth or joint line tenderness or patellar tenderness or condyle tenderness. ROM normal in flexion and extension and lower leg rotation. Ligaments with solid consistent endpoints including ACL, PCL, LCL, MCL. Negative Mcmurray's and provocative meniscal tests. Non painful patellar compression. Patellar and quadriceps tendons unremarkable. Hamstring and quadriceps strength is normal.  Impression and Recommendations:

## 2014-02-05 NOTE — Assessment & Plan Note (Signed)
Pain resolved after injection and formal physical therapy. Advanced home exercise program, return as needed.

## 2014-03-24 ENCOUNTER — Other Ambulatory Visit: Payer: Self-pay | Admitting: Family Medicine

## 2014-04-16 ENCOUNTER — Other Ambulatory Visit: Payer: Self-pay | Admitting: Family Medicine

## 2014-04-16 DIAGNOSIS — Z1231 Encounter for screening mammogram for malignant neoplasm of breast: Secondary | ICD-10-CM

## 2014-04-21 ENCOUNTER — Other Ambulatory Visit: Payer: Self-pay | Admitting: Family Medicine

## 2014-05-02 ENCOUNTER — Other Ambulatory Visit: Payer: Self-pay | Admitting: Family Medicine

## 2014-05-05 DIAGNOSIS — M5136 Other intervertebral disc degeneration, lumbar region: Secondary | ICD-10-CM | POA: Diagnosis not present

## 2014-05-05 DIAGNOSIS — M9904 Segmental and somatic dysfunction of sacral region: Secondary | ICD-10-CM | POA: Diagnosis not present

## 2014-05-05 DIAGNOSIS — M9903 Segmental and somatic dysfunction of lumbar region: Secondary | ICD-10-CM | POA: Diagnosis not present

## 2014-05-10 DIAGNOSIS — M9903 Segmental and somatic dysfunction of lumbar region: Secondary | ICD-10-CM | POA: Diagnosis not present

## 2014-05-10 DIAGNOSIS — M5136 Other intervertebral disc degeneration, lumbar region: Secondary | ICD-10-CM | POA: Diagnosis not present

## 2014-05-10 DIAGNOSIS — M9904 Segmental and somatic dysfunction of sacral region: Secondary | ICD-10-CM | POA: Diagnosis not present

## 2014-05-13 DIAGNOSIS — M9904 Segmental and somatic dysfunction of sacral region: Secondary | ICD-10-CM | POA: Diagnosis not present

## 2014-05-13 DIAGNOSIS — M9903 Segmental and somatic dysfunction of lumbar region: Secondary | ICD-10-CM | POA: Diagnosis not present

## 2014-05-13 DIAGNOSIS — M5136 Other intervertebral disc degeneration, lumbar region: Secondary | ICD-10-CM | POA: Diagnosis not present

## 2014-05-20 ENCOUNTER — Other Ambulatory Visit: Payer: Self-pay | Admitting: *Deleted

## 2014-05-20 MED ORDER — AMLODIPINE BESYLATE 5 MG PO TABS: 5.0000 mg | ORAL_TABLET | Freq: Every day | ORAL | Status: DC

## 2014-05-27 ENCOUNTER — Ambulatory Visit (INDEPENDENT_AMBULATORY_CARE_PROVIDER_SITE_OTHER): Payer: Medicare Other | Admitting: Family Medicine

## 2014-05-27 ENCOUNTER — Encounter: Payer: Self-pay | Admitting: Family Medicine

## 2014-05-27 VITALS — BP 142/72 | HR 85 | Ht 70.0 in | Wt 220.0 lb

## 2014-05-27 DIAGNOSIS — I10 Essential (primary) hypertension: Secondary | ICD-10-CM

## 2014-05-27 DIAGNOSIS — M1711 Unilateral primary osteoarthritis, right knee: Secondary | ICD-10-CM

## 2014-05-27 MED ORDER — AMLODIPINE BESYLATE 5 MG PO TABS: 5.0000 mg | ORAL_TABLET | Freq: Every day | ORAL | Status: DC

## 2014-05-27 MED ORDER — LISINOPRIL-HYDROCHLOROTHIAZIDE 20-12.5 MG PO TABS: 1.0000 | ORAL_TABLET | Freq: Every day | ORAL | Status: DC

## 2014-05-27 NOTE — Progress Notes (Signed)
   Subjective:    Patient ID: [name], female    DOB: [birth year], 69 y.o.   MRN: [id number]  HPI Hypertension- Pt denies chest pain, SOB, dizziness, or heart palpitations.  Taking meds as directed w/o problems.  Denies medication side effects.    Knees and back have been very painful. Says it has kept from exercising.  She would ike to see an orthopedist.  Says her legs feel weak at times.  Has gained about 10 lbs in the last year.    Review of Systems     Objective:   Physical Exam  Constitutional: She is oriented to person, place, and time. She appears well-developed and well-nourished.  HENT:  Head: Normocephalic and atraumatic.  Neck: Neck supple. No thyromegaly present.  Cardiovascular: Normal rate, regular rhythm and normal heart sounds.   No carotid bruits.  Pulmonary/Chest: Effort normal and breath sounds normal.  Lymphadenopathy:    She has no cervical adenopathy.  Neurological: She is alert and oriented to person, place, and time.  Skin: Skin is warm and dry.  Psychiatric: She has a normal mood and affect. Her behavior is normal.          Assessment & Plan:  HTN - uncontrolled. Repeat was better.  F/U in 6-8 weeks to recheck BP.  reminder to get labs done. REfills sent to the pharmacy.   Knee osteoarthritis-she's at the point that she would like referral to an orthopedist. She did have an injection and physical therapy last year that was helpful but now she's having persistent pain is keeping her from exercising. Will refer to Dr. Homero FellersFrank Alucio.

## 2014-06-08 DIAGNOSIS — M5136 Other intervertebral disc degeneration, lumbar region: Secondary | ICD-10-CM | POA: Diagnosis not present

## 2014-06-08 DIAGNOSIS — M9903 Segmental and somatic dysfunction of lumbar region: Secondary | ICD-10-CM | POA: Diagnosis not present

## 2014-06-08 DIAGNOSIS — M9904 Segmental and somatic dysfunction of sacral region: Secondary | ICD-10-CM | POA: Diagnosis not present

## 2014-06-10 ENCOUNTER — Encounter: Payer: Self-pay | Admitting: Family Medicine

## 2014-06-10 ENCOUNTER — Telehealth: Payer: Self-pay | Admitting: Family Medicine

## 2014-06-10 ENCOUNTER — Ambulatory Visit (INDEPENDENT_AMBULATORY_CARE_PROVIDER_SITE_OTHER): Payer: 59 | Admitting: Family Medicine

## 2014-06-10 VITALS — BP 130/82 | HR 96 | Wt 214.0 lb

## 2014-06-10 DIAGNOSIS — M25569 Pain in unspecified knee: Secondary | ICD-10-CM | POA: Diagnosis not present

## 2014-06-10 DIAGNOSIS — E785 Hyperlipidemia, unspecified: Secondary | ICD-10-CM | POA: Diagnosis not present

## 2014-06-10 DIAGNOSIS — Z23 Encounter for immunization: Secondary | ICD-10-CM | POA: Diagnosis not present

## 2014-06-10 DIAGNOSIS — Z0000 Encounter for general adult medical examination without abnormal findings: Secondary | ICD-10-CM

## 2014-06-10 DIAGNOSIS — Q632 Ectopic kidney: Secondary | ICD-10-CM | POA: Diagnosis not present

## 2014-06-10 DIAGNOSIS — I10 Essential (primary) hypertension: Secondary | ICD-10-CM | POA: Diagnosis not present

## 2014-06-10 DIAGNOSIS — R7301 Impaired fasting glucose: Secondary | ICD-10-CM

## 2014-06-10 DIAGNOSIS — M79605 Pain in left leg: Principal | ICD-10-CM

## 2014-06-10 DIAGNOSIS — M79604 Pain in right leg: Secondary | ICD-10-CM

## 2014-06-10 NOTE — Telephone Encounter (Signed)
Patient asked that i send you a message to remind you about the referral for her legs.  thanks

## 2014-06-10 NOTE — Progress Notes (Signed)
Subjective:    Patient ID: [name], female    DOB: [birth year], 69 y.o.   MRN: [id number]  HPI   Here for CPE.   See [name] for OB/GYN, DEXA and mammogram.    Review of Systems     Objective:   Physical Exam  Constitutional: She is oriented to person, place, and time. She appears well-developed and well-nourished.  HENT:  Head: Normocephalic and atraumatic.  Eyes: Conjunctivae and EOM are normal.  Cardiovascular: Normal rate.   Pulmonary/Chest: Effort normal.  Neurological: She is alert and oriented to person, place, and time.  Skin: Skin is dry. No pallor.  Psychiatric: She has a normal mood and affect. Her behavior is normal.          Assessment & Plan:    Subjective:    [name] is a 69 y.o. female who presents for Medicare Annual/Subsequent preventive examination.  Preventive Screening-Counseling & Management  Tobacco History  Smoking status  . Never Smoker   Smokeless tobacco  . Never Used     Problems Prior to Visit 1.   Current Problems (verified) Patient Active Problem List   Diagnosis Date Noted  . Left shoulder pain 07/27/2013  . Osteoarthritis of right knee 07/27/2013  . Lumbar degenerative disc disease 04/07/2013  . Pelvic kidney 07/30/2012  . Hyperlipidemia 01/06/2008  . HYPERTENSION, BENIGN 01/06/2008    Medications Prior to Visit Current Outpatient Prescriptions on File Prior to Visit  Medication Sig Dispense Refill  . amLODipine (NORVASC) 5 MG tablet Take 1 tablet (5 mg total) by mouth daily. 90 tablet 1  . aspirin 81 MG tablet Take 81 mg by mouth daily.      . Calcium Carbonate-Vitamin D (CALCIUM 600+D) 600-200 MG-UNIT TABS Take by mouth.      . cholecalciferol (VITAMIN D) 400 UNITS TABS Take by mouth.      Marland Kitchen lisinopril-hydrochlorothiazide (PRINZIDE,ZESTORETIC) 20-12.5 MG per tablet Take 1 tablet by mouth daily. 90 tablet 1  . Multiple Vitamins-Minerals (MULTIVITAMIN PO) Take by mouth.    . Omega-3 Fatty  Acids (FISH OIL) 1000 MG CPDR Take by mouth.      . Probiotic Product (PROBIOTIC DAILY PO) Take by mouth.     No current facility-administered medications on file prior to visit.    Current Medications (verified) Current Outpatient Prescriptions  Medication Sig Dispense Refill  . amLODipine (NORVASC) 5 MG tablet Take 1 tablet (5 mg total) by mouth daily. 90 tablet 1  . aspirin 81 MG tablet Take 81 mg by mouth daily.      . Calcium Carbonate-Vitamin D (CALCIUM 600+D) 600-200 MG-UNIT TABS Take by mouth.      . cholecalciferol (VITAMIN D) 400 UNITS TABS Take by mouth.      Marland Kitchen lisinopril-hydrochlorothiazide (PRINZIDE,ZESTORETIC) 20-12.5 MG per tablet Take 1 tablet by mouth daily. 90 tablet 1  . Multiple Vitamins-Minerals (MULTIVITAMIN PO) Take by mouth.    . Omega-3 Fatty Acids (FISH OIL) 1000 MG CPDR Take by mouth.      Marland Kitchen OVER THE COUNTER MEDICATION Curamin Extra Strength PO    . OVER THE COUNTER MEDICATION Cura Med 750 mg PO    . OVER THE COUNTER MEDICATION Joint Astin PO    . OVER THE COUNTER MEDICATION Skeletal Strength PO    . Probiotic Product (PROBIOTIC DAILY PO) Take by mouth.     No current facility-administered medications for this visit.     Allergies (verified) Penicillins   PAST HISTORY  Family History Family History  Problem Relation Age of Onset  . Breast cancer Sister 65    COD breast cancer and pneumonia  . Hypertension Sister   . Heart attack Father   . Hypertension Father   . Hypertension Mother   . Kidney disease Mother   . Hypertension Brother   . Colon cancer Neg Hx   . Esophageal cancer Neg Hx   . Rectal cancer Neg Hx   . Stomach cancer Neg Hx     Social History History  Substance Use Topics  . Smoking status: Never Smoker   . Smokeless tobacco: Never Used  . Alcohol Use: No     Are there smokers in your home (other than you)? Yes  Risk Factors Current exercise habits: The patient does not participate in regular exercise at present.   Dietary issues discussed: has been eating better and has lost 6 lbs.    Cardiac risk factors: advanced age (older than 35 for men, 4 for women) and sedentary lifestyle.  Depression Screen (Note: if answer to either of the following is "Yes", a more complete depression screening is indicated)   Over the past two weeks, have you felt down, depressed or hopeless? No  Over the past two weeks, have you felt little interest or pleasure in doing things? No  Have you lost interest or pleasure in daily life? No  Do you often feel hopeless? No  Do you cry easily over simple problems? No  Activities of Daily Living In your present state of health, do you have any difficulty performing the following activities?:  Driving? No Managing money?  No Feeding yourself? No Getting from bed to chair? No  Climbing a flight of stairs? Yes, secondary to knee pain Preparing food and eating?: No Bathing or showering? No Getting dressed: No Getting to the toilet? No Using the toilet:No Moving around from place to place: No In the past year have you fallen or had a near fall?:No   Are you sexually active?  No  Do you have more than one partner?  No  Hearing Difficulties: No Do you often ask people to speak up or repeat themselves? No Do you experience ringing or noises in your ears? No Do you have difficulty understanding soft or whispered voices? No   Do you feel that you have a problem with memory? No  Do you often misplace items? No  Do you feel safe at home?  Yes  Cognitive Testing  Alert? Yes  Normal Appearance?Yes  Oriented to person? Yes  Place? Yes   Time? Yes  Recall of three objects?  Yes  Can perform simple calculations? Yes  Displays appropriate judgment?Yes  Can read the correct time from a watch face?Yes   Advanced Directives have been discussed with the patient? No  List the Names of Other Physician/Practitioners you currently use: 1.    Indicate any recent Medical Services  you may have received from other than [facility] providers in the past year (date may be approximate).  Immunization History  Administered Date(s) Administered  . Influenza Split 01/30/2011  . Influenza,inj,Quad PF,36+ Mos 12/02/2012, 11/17/2013  . Pneumococcal Polysaccharide-23 01/30/2011  . Td 02/27/1995, 02/27/2007    Screening Tests Health Maintenance  Topic Date Due  . Hepatitis C Screening  1945/09/25  . ZOSTAVAX  06/23/2005  . PNA vac Low Risk Adult (2 of 2 - PCV13) 01/30/2012  . INFLUENZA VACCINE  09/27/2014  . MAMMOGRAM  06/19/2015  . TETANUS/TDAP  02/26/2017  .  COLONOSCOPY  12/05/2021  . DEXA SCAN  Completed    All answers were reviewed with the patient and necessary referrals were made:  METHENEY,CATHERINE, MD   06/10/2014   History reviewed: allergies, current medications, past family history, past medical history, past social history, past surgical history and problem list  Review of Systems A comprehensive review of systems was negative.    Objective:     Vision by Snellen chart: right eye:20/50, left eye:20/40  Body mass index is 30.71 kg/(m^2). BP 160/90 mmHg  Pulse 96  Wt 214 lb (97.07 kg)  SpO2 95%  BP 130/82 mmHg  Pulse 96  Wt 214 lb (97.07 kg)  SpO2 95%  General Appearance:    Alert, cooperative, no distress, appears stated age  Head:    Normocephalic, without obvious abnormality, atraumatic  Eyes:    PERRL, conjunctiva/corneas clear, EOM's intact, fundi    benign, both eyes  Ears:    Normal TM's and external ear canals, both ears  Nose:   Nares normal, septum midline, mucosa normal, no drainage    or sinus tenderness  Throat:   Lips, mucosa, and tongue normal; teeth and gums normal  Neck:   Supple, symmetrical, trachea midline, no adenopathy;    thyroid:  no enlargement/tenderness/nodules; no carotid   bruit or JVD  Back:     Symmetric, no curvature, ROM normal, no CVA tenderness  Lungs:     Clear to auscultation bilaterally, respirations  unlabored  Chest Wall:    No tenderness or deformity   Heart:    Regular rate and rhythm, S1 and S2 normal, no murmur, rub   or gallop  Breast Exam:    No tenderness, masses, or nipple abnormality  Abdomen:     Soft, non-tender, bowel sounds active all four quadrants,    no masses, no organomegaly  Genitalia:    Not performed   Rectal:    Not performed  Extremities:   Extremities normal, atraumatic, no cyanosis or edema  Pulses:   2+ and symmetric all extremities  Skin:   Skin color, texture, turgor normal, no rashes or lesions  Lymph nodes:   Cervical, supraclavicular, and axillary nodes normal  Neurologic:   CNII-XII intact, normal strength, sensation and reflexes    throughout       Assessment:     Medicare Wellness Eye exam       Plan:     During the course of the visit the patient was educated and counseled about appropriate screening and preventive services including:    Nutrition counseling    Prevnar 13 given today.   Would like new referral for orthopedics. We had tried to get her scheduled but they're next appointment was in August. We will see if we can get her scheduled for something sooner.  HTN- well controlled. F/U in 6 months  Screen for DM with current risk factors.   Diet review for nutrition referral? Yes ____  Not Indicated _x_   Patient Instructions (the written plan) was given to the patient.  Medicare Attestation I have personally reviewed: The patient's medical and social history Their use of alcohol, tobacco or illicit drugs Their current medications and supplements The patient's functional ability including ADLs,fall risks, home safety risks, cognitive, and hearing and visual impairment Diet and physical activities Evidence for depression or mood disorders  The patient's weight, height, BMI, and visual acuity have been recorded in the chart.  I have made referrals, counseling, and provided education to the  patient based on review of the  above and I have provided the patient with a written personalized care plan for preventive services.     METHENEY,CATHERINE, MD   06/10/2014

## 2014-06-11 ENCOUNTER — Encounter: Payer: Self-pay | Admitting: Family Medicine

## 2014-06-11 DIAGNOSIS — R7301 Impaired fasting glucose: Secondary | ICD-10-CM | POA: Insufficient documentation

## 2014-06-11 DIAGNOSIS — M9903 Segmental and somatic dysfunction of lumbar region: Secondary | ICD-10-CM | POA: Diagnosis not present

## 2014-06-11 DIAGNOSIS — E118 Type 2 diabetes mellitus with unspecified complications: Secondary | ICD-10-CM | POA: Insufficient documentation

## 2014-06-11 DIAGNOSIS — M5136 Other intervertebral disc degeneration, lumbar region: Secondary | ICD-10-CM | POA: Diagnosis not present

## 2014-06-11 DIAGNOSIS — N1832 Chronic kidney disease, stage 3b: Secondary | ICD-10-CM | POA: Insufficient documentation

## 2014-06-11 DIAGNOSIS — E119 Type 2 diabetes mellitus without complications: Secondary | ICD-10-CM | POA: Insufficient documentation

## 2014-06-11 DIAGNOSIS — N183 Chronic kidney disease, stage 3 (moderate): Secondary | ICD-10-CM

## 2014-06-11 DIAGNOSIS — M9904 Segmental and somatic dysfunction of sacral region: Secondary | ICD-10-CM | POA: Diagnosis not present

## 2014-06-11 LAB — LIPID PANEL
CHOL/HDL RATIO: 5.1 ratio
Cholesterol: 197 mg/dL (ref 0–200)
HDL: 39 mg/dL — AB (ref >=46)
LDL CALC: 130 mg/dL — AB (ref 0–99)
TRIGLYCERIDES: 142 mg/dL (ref <150)
VLDL: 28 mg/dL (ref 0–40)

## 2014-06-11 LAB — COMPLETE METABOLIC PANEL WITH GFR
ALT: 19 U/L (ref 0–35)
AST: 29 U/L (ref 0–37)
Albumin: 4.6 g/dL (ref 3.5–5.2)
Alkaline Phosphatase: 82 U/L (ref 39–117)
BUN: 18 mg/dL (ref 6–23)
CALCIUM: 10.2 mg/dL (ref 8.4–10.5)
CO2: 29 meq/L (ref 19–32)
Chloride: 96 mEq/L (ref 96–112)
Creat: 1 mg/dL (ref 0.50–1.10)
GFR, EST AFRICAN AMERICAN: 67 mL/min
GFR, Est Non African American: 58 mL/min — ABNORMAL LOW
GLUCOSE: 84 mg/dL (ref 70–99)
POTASSIUM: 4 meq/L (ref 3.5–5.3)
SODIUM: 135 meq/L (ref 135–145)
TOTAL PROTEIN: 7.8 g/dL (ref 6.0–8.3)
Total Bilirubin: 0.6 mg/dL (ref 0.2–1.2)

## 2014-06-11 LAB — CBC
HEMATOCRIT: 36.2 % (ref 36.0–46.0)
Hemoglobin: 11.7 g/dL — ABNORMAL LOW (ref 12.0–15.0)
MCH: 26.5 pg (ref 26.0–34.0)
MCHC: 32.3 g/dL (ref 30.0–36.0)
MCV: 82.1 fL (ref 78.0–100.0)
MPV: 9.5 fL (ref 8.6–12.4)
Platelets: 260 10*3/uL (ref 150–400)
RBC: 4.41 MIL/uL (ref 3.87–5.11)
RDW: 14.5 % (ref 11.5–15.5)
WBC: 5.6 10*3/uL (ref 4.0–10.5)

## 2014-06-11 LAB — HEMOGLOBIN A1C
HEMOGLOBIN A1C: 5.9 % — AB (ref <5.7)
Mean Plasma Glucose: 123 mg/dL — ABNORMAL HIGH (ref <117)

## 2014-06-11 LAB — VITAMIN D 25 HYDROXY (VIT D DEFICIENCY, FRACTURES): Vit D, 25-Hydroxy: 50 ng/mL (ref 30–100)

## 2014-06-11 LAB — FERRITIN: Ferritin: 158 ng/mL (ref 10–291)

## 2014-06-11 NOTE — Telephone Encounter (Signed)
Ok, referral placed

## 2014-06-18 DIAGNOSIS — M25561 Pain in right knee: Secondary | ICD-10-CM | POA: Diagnosis not present

## 2014-06-18 DIAGNOSIS — M170 Bilateral primary osteoarthritis of knee: Secondary | ICD-10-CM | POA: Diagnosis not present

## 2014-06-18 DIAGNOSIS — M25562 Pain in left knee: Secondary | ICD-10-CM | POA: Diagnosis not present

## 2014-06-23 ENCOUNTER — Encounter: Payer: Self-pay | Admitting: Women's Health

## 2014-06-23 ENCOUNTER — Other Ambulatory Visit: Payer: Self-pay | Admitting: Family Medicine

## 2014-06-23 ENCOUNTER — Ambulatory Visit (HOSPITAL_COMMUNITY)
Admission: RE | Admit: 2014-06-23 | Discharge: 2014-06-23 | Disposition: A | Discharge status: Home / Self Care | Payer: Medicare Other | Source: Ambulatory Visit | Attending: Family Medicine | Admitting: Family Medicine

## 2014-06-23 ENCOUNTER — Ambulatory Visit (INDEPENDENT_AMBULATORY_CARE_PROVIDER_SITE_OTHER): Payer: 59 | Admitting: Women's Health

## 2014-06-23 VITALS — BP 138/78 | Ht 69.0 in | Wt 212.0 lb

## 2014-06-23 DIAGNOSIS — Z1231 Encounter for screening mammogram for malignant neoplasm of breast: Secondary | ICD-10-CM

## 2014-06-23 DIAGNOSIS — N952 Postmenopausal atrophic vaginitis: Secondary | ICD-10-CM

## 2014-06-23 DIAGNOSIS — S83281A Other tear of lateral meniscus, current injury, right knee, initial encounter: Secondary | ICD-10-CM | POA: Diagnosis not present

## 2014-06-23 DIAGNOSIS — M25561 Pain in right knee: Secondary | ICD-10-CM | POA: Diagnosis not present

## 2014-06-23 DIAGNOSIS — M94261 Chondromalacia, right knee: Secondary | ICD-10-CM | POA: Diagnosis not present

## 2014-06-23 NOTE — Progress Notes (Signed)
[name] [birth year] [id number]    History:    Presents for breast and pelvic exam and vaginal atrophy.  TAH for fibroids on no HRT. Normal Pap and mammogram history. Hypertension and hypercholesterolemia managed by primary care. Negative colonoscopy 2013. Current on vaccines. 2013 DEXA T score 3.8 at spine hip average 1.7.   Past medical history, past surgical history, family history and social history were all reviewed and documented in the EPIC chart. Retired from a bank. Mother, father, sister, brother-hypertension.  ROS:  A ROS was performed and pertinent positives and negatives are included.  Exam:  Filed Vitals:   06/23/14 0822  BP: 138/78    General appearance:  Normal Thyroid:  Symmetrical, normal in size, without palpable masses or nodularity. Respiratory  Auscultation:  Clear without wheezing or rhonchi Cardiovascular  Auscultation:  Regular rate, without rubs, murmurs or gallops  Edema/varicosities:  Not grossly evident Abdominal  Soft,nontender, without masses, guarding or rebound.  Liver/spleen:  No organomegaly noted  Hernia:  None appreciated  Skin  Inspection:  Grossly normal   Breasts: Examined lying and sitting.     Right: Without masses, retractions, discharge or axillary adenopathy.     Left: Without masses, retractions, discharge or axillary adenopathy. Gentitourinary   Inguinal/mons:  Normal without inguinal adenopathy  External genitalia:  Normal  BUS/Urethra/Skene's glands:  Normal  Vagina:  Atrophic  Cervix:  Absent Uterus: Absent  Adnexa/parametria:     Rt: Without masses or tenderness.   Lt: Without masses or tenderness.  Anus and perineum: Normal  Digital rectal exam: Normal sphincter tone without palpated masses or tenderness  Assessment/Plan:  69 y.o. MBF G0 for breast and pelvic exam.  Atrophic vaginitis TAH for fibroids Hypertension/hypercholesterolemia-primary care manages labs and meds Right Knee pain, has follow-up  scheduled  Plan: Vaginal lubricants encouraged with intercourse. SBE's, continue annual screening mammogram, history of breast reduction. Encouraged regular exercise, water aerobics, home safety, fall prevention discussed. Vitamin D 2000 daily encouraged. [facility] Challenger.  [name],[name] [facility] And Surgery Center IncWHNP, 9:42 AM 06/23/2014

## 2014-06-23 NOTE — Patient Instructions (Signed)
Health Recommendations for Postmenopausal Women Respected and ongoing research has looked at the most common causes of death, disability, and poor quality of life in postmenopausal women. The causes include heart disease, diseases of blood vessels, diabetes, depression, cancer, and bone loss (osteoporosis). Many things can be done to help lower the chances of developing these and other common problems. CARDIOVASCULAR DISEASE Heart Disease: A heart attack is a medical emergency. Know the signs and symptoms of a heart attack. Below are things women can do to reduce their risk for heart disease.   Do not smoke. If you smoke, quit.  Aim for a healthy weight. Being overweight causes many preventable deaths. Eat a healthy and balanced diet and drink an adequate amount of liquids.  Get moving. Make a commitment to be more physically active. Aim for 30 minutes of activity on most, if not all days of the week.  Eat for heart health. Choose a diet that is low in saturated fat and cholesterol and eliminate trans fat. Include whole grains, vegetables, and fruits. Read and understand the labels on food containers before buying.  Know your numbers. Ask your caregiver to check your blood pressure, cholesterol (total, HDL, LDL, triglycerides) and blood glucose. Work with your caregiver on improving your entire clinical picture.  High blood pressure. Limit or stop your table salt intake (try salt substitute and food seasonings). Avoid salty foods and drinks. Read labels on food containers before buying. Eating well and exercising can help control high blood pressure. STROKE  Stroke is a medical emergency. Stroke may be the result of a blood clot in a blood vessel in the brain or by a brain hemorrhage (bleeding). Know the signs and symptoms of a stroke. To lower the risk of developing a stroke:  Avoid fatty foods.  Quit smoking.  Control your diabetes, blood pressure, and irregular heart rate. THROMBOPHLEBITIS  (BLOOD CLOT) OF THE LEG  Becoming overweight and leading a stationary lifestyle may also contribute to developing blood clots. Controlling your diet and exercising will help lower the risk of developing blood clots. CANCER SCREENING  Breast Cancer: Take steps to reduce your risk of breast cancer.  You should practice "breast self-awareness." This means understanding the normal appearance and feel of your breasts and should include breast self-examination. Any changes detected, no matter how small, should be reported to your caregiver.  After age 4, you should have a clinical breast exam (CBE) every year.  Starting at age 48, you should consider having a mammogram (breast X-ray) every year.  If you have a family history of breast cancer, talk to your caregiver about genetic screening.  If you are at high risk for breast cancer, talk to your caregiver about having an MRI and a mammogram every year.  Intestinal or Stomach Cancer: Tests to consider are a rectal exam, fecal occult blood, sigmoidoscopy, and colonoscopy. Women who are high risk may need to be screened at an earlier age and more often.  Cervical Cancer:  Beginning at age 72, you should have a Pap test every 3 years as long as the past 3 Pap tests have been normal.  If you have had past treatment for cervical cancer or a condition that could lead to cancer, you need Pap tests and screening for cancer for at least 20 years after your treatment.  If you had a hysterectomy for a problem that was not cancer or a condition that could lead to cancer, then you no longer need Pap tests.  If you are between ages 65 and 70, and you have had normal Pap tests going back 10 years, you no longer need Pap tests.  If Pap tests have been discontinued, risk factors (such as a new sexual partner) need to be reassessed to determine if screening should be resumed.  Some medical problems can increase the chance of getting cervical cancer. In these  cases, your caregiver may recommend more frequent screening and Pap tests.  Uterine Cancer: If you have vaginal bleeding after reaching menopause, you should notify your caregiver.  Ovarian Cancer: Other than yearly pelvic exams, there are no reliable tests available to screen for ovarian cancer at this time except for yearly pelvic exams.  Lung Cancer: Yearly chest X-rays can detect lung cancer and should be done on high risk women, such as cigarette smokers and women with chronic lung disease (emphysema).  Skin Cancer: A complete body skin exam should be done at your yearly examination. Avoid overexposure to the sun and ultraviolet light lamps. Use a strong sun block cream when in the sun. All of these things are important for lowering the risk of skin cancer. MENOPAUSE Menopause Symptoms: Hormone therapy products are effective for treating symptoms associated with menopause:  Moderate to severe hot flashes.  Night sweats.  Mood swings.  Headaches.  Tiredness.  Loss of sex drive.  Insomnia.  Other symptoms. Hormone replacement carries certain risks, especially in older women. Women who use or are thinking about using estrogen or estrogen with progestin treatments should discuss that with their caregiver. Your caregiver will help you understand the benefits and risks. The ideal dose of hormone replacement therapy is not known. The Food and Drug Administration (FDA) has concluded that hormone therapy should be used only at the lowest doses and for the shortest amount of time to reach treatment goals.  OSTEOPOROSIS Protecting Against Bone Loss and Preventing Fracture If you use hormone therapy for prevention of bone loss (osteoporosis), the risks for bone loss must outweigh the risk of the therapy. Ask your caregiver about other medications known to be safe and effective for preventing bone loss and fractures. To guard against bone loss or fractures, the following is recommended:  If  you are younger than age 50, take 1000 mg of calcium and at least 600 mg of Vitamin D per day.  If you are older than age 50 but younger than age 70, take 1200 mg of calcium and at least 600 mg of Vitamin D per day.  If you are older than age 70, take 1200 mg of calcium and at least 800 mg of Vitamin D per day. Smoking and excessive alcohol intake increases the risk of osteoporosis. Eat foods rich in calcium and vitamin D and do weight bearing exercises several times a week as your caregiver suggests. DIABETES Diabetes Mellitus: If you have type I or type 2 diabetes, you should keep your blood sugar under control with diet, exercise, and recommended medication. Avoid starchy and fatty foods, and too many sweets. Being overweight can make diabetes control more difficult. COGNITION AND MEMORY Cognition and Memory: Menopausal hormone therapy is not recommended for the prevention of cognitive disorders such as Alzheimer's disease or memory loss.  DEPRESSION  Depression may occur at any age, but it is common in elderly women. This may be because of physical, medical, social (loneliness), or financial problems and needs. If you are experiencing depression because of medical problems and control of symptoms, talk to your caregiver about this. Physical   activity and exercise may help with mood and sleep. Community and volunteer involvement may improve your sense of value and worth. If you have depression and you feel that the problem is getting worse or becoming severe, talk to your caregiver about which treatment options are best for you. ACCIDENTS  Accidents are common and can be serious in elderly woman. Prepare your house to prevent accidents. Eliminate throw rugs, place hand bars in bath, shower, and toilet areas. Avoid wearing high heeled shoes or walking on wet, snowy, and icy areas. Limit or stop driving if you have vision or hearing problems, or if you feel you are unsteady with your movements and  reflexes. HEPATITIS C Hepatitis C is a type of viral infection affecting the liver. It is spread mainly through contact with blood from an infected person. It can be treated, but if left untreated, it can lead to severe liver damage over the years. Many people who are infected do not know that the virus is in their blood. If you are a "baby-boomer", it is recommended that you have one screening test for Hepatitis C. IMMUNIZATIONS  Several immunizations are important to consider having during your senior years, including:   Tetanus, diphtheria, and pertussis booster shot.  Influenza every year before the flu season begins.  Pneumonia vaccine.  Shingles vaccine.  Others, as indicated based on your specific needs. Talk to your caregiver about these. Document Released: 04/06/2005 Document Revised: 06/29/2013 Document Reviewed: 12/01/2007 ExitCare Patient Information 2015 [employer]. This information is not intended to replace advice given to you by your health care provider. Make sure you discuss any questions you have with your health care provider. Exercise to Stay Healthy Exercise helps you become and stay healthy. EXERCISE IDEAS AND TIPS Choose exercises that:  You enjoy.  Fit into your day. You do not need to exercise really hard to be healthy. You can do exercises at a slow or medium level and stay healthy. You can:  Stretch before and after working out.  Try yoga, Pilates, or tai chi.  Lift weights.  Walk fast, swim, jog, run, climb stairs, bicycle, dance, or rollerskate.  Take aerobic classes. Exercises that burn about 150 calories:  Running 1  miles in 15 minutes.  Playing volleyball for 45 to 60 minutes.  Washing and waxing a car for 45 to 60 minutes.  Playing touch football for 45 minutes.  Walking 1  miles in 35 minutes.  Pushing a stroller 1  miles in 30 minutes.  Playing basketball for 30 minutes.  Raking leaves for 30 minutes.  Bicycling 5  miles in 30 minutes.  Walking 2 miles in 30 minutes.  Dancing for 30 minutes.  Shoveling snow for 15 minutes.  Swimming laps for 20 minutes.  Walking up stairs for 15 minutes.  Bicycling 4 miles in 15 minutes.  Gardening for 30 to 45 minutes.  Jumping rope for 15 minutes.  Washing windows or floors for 45 to 60 minutes. Document Released: 03/17/2010 Document Revised: 05/07/2011 Document Reviewed: 03/17/2010 Exit[employer] Patient Information 2015 [employer]. This information is not intended to replace advice given to you by your health care provider. Make sure you discuss any questions you have with your health care provider.  

## 2014-07-02 DIAGNOSIS — M170 Bilateral primary osteoarthritis of knee: Secondary | ICD-10-CM | POA: Diagnosis not present

## 2014-07-02 DIAGNOSIS — M25561 Pain in right knee: Secondary | ICD-10-CM | POA: Diagnosis not present

## 2014-07-02 DIAGNOSIS — M25562 Pain in left knee: Secondary | ICD-10-CM | POA: Diagnosis not present

## 2014-08-06 ENCOUNTER — Other Ambulatory Visit: Payer: Self-pay | Admitting: *Deleted

## 2014-08-06 DIAGNOSIS — M1711 Unilateral primary osteoarthritis, right knee: Secondary | ICD-10-CM

## 2014-08-27 DIAGNOSIS — M170 Bilateral primary osteoarthritis of knee: Secondary | ICD-10-CM | POA: Diagnosis not present

## 2014-12-09 ENCOUNTER — Other Ambulatory Visit: Payer: Self-pay | Admitting: Family Medicine

## 2014-12-10 ENCOUNTER — Ambulatory Visit: Payer: Medicare Other | Admitting: Family Medicine

## 2014-12-13 ENCOUNTER — Ambulatory Visit (INDEPENDENT_AMBULATORY_CARE_PROVIDER_SITE_OTHER): Payer: Medicare Other | Admitting: Family Medicine

## 2014-12-13 ENCOUNTER — Encounter: Payer: Self-pay | Admitting: Family Medicine

## 2014-12-13 VITALS — BP 142/72 | HR 85 | Temp 98.4°F | Resp 16 | Wt 213.6 lb

## 2014-12-13 DIAGNOSIS — I10 Essential (primary) hypertension: Secondary | ICD-10-CM | POA: Diagnosis not present

## 2014-12-13 DIAGNOSIS — N1832 Chronic kidney disease, stage 3b: Secondary | ICD-10-CM

## 2014-12-13 DIAGNOSIS — Z23 Encounter for immunization: Secondary | ICD-10-CM

## 2014-12-13 DIAGNOSIS — N183 Chronic kidney disease, stage 3 (moderate): Secondary | ICD-10-CM

## 2014-12-13 DIAGNOSIS — R7301 Impaired fasting glucose: Secondary | ICD-10-CM

## 2014-12-13 LAB — POCT GLYCOSYLATED HEMOGLOBIN (HGB A1C): Hemoglobin A1C: 5.8

## 2014-12-13 NOTE — Progress Notes (Signed)
   Subjective:    Patient  [name], female    DOB: 02/03/1946, 69 y.o.   MRN: [id number]  HPI Hypertension- Pt denies chest pain, SOB, dizziness, or heart palpitations.  Taking meds as directed w/o problems.  Denies medication side effects.    IFG - no inc thirst or urination.   CKD 3 - no change in urination.     She's not been very active and we discuss getting on track with diet and exerciseD:She is now retired. She's been a little bit stress recently because her husband is also retired but he's been drinking a lot. To the point of access. He has an appointment later today and she was hoping that he would bring it up.  Review of Systems     Objective:   Physical Exam  Constitutional: She is oriented to person, place, and time. She appears well-developed and well-nourished.  HENT:  Head: Normocephalic and atraumatic.  Cardiovascular: Normal rate, regular rhythm and normal heart sounds.   Pulmonary/Chest: Effort normal and breath sounds normal.  Neurological: She is alert and oriented to person, place, and time.  Skin: Skin is warm and dry.  Psychiatric: She has a normal mood and affect. Her behavior is normal.          Assessment & Plan:  HTN -  Blood pressure just slightly elevated today.otherwise doing well.  IFG - well controlled. Stable. F/U in 6 mo.    CKD- due to recheck kidney function.

## 2014-12-14 LAB — BASIC METABOLIC PANEL WITH GFR
BUN: 19 mg/dL (ref 7–25)
CALCIUM: 10.2 mg/dL (ref 8.6–10.4)
CHLORIDE: 97 mmol/L — AB (ref 98–110)
CO2: 29 mmol/L (ref 20–31)
CREATININE: 1.1 mg/dL — AB (ref 0.50–0.99)
GFR, EST NON AFRICAN AMERICAN: 51 mL/min — AB (ref >=60)
GFR, Est African American: 59 mL/min — ABNORMAL LOW (ref >=60)
Glucose, Bld: 104 mg/dL — ABNORMAL HIGH (ref 65–99)
POTASSIUM: 4.5 mmol/L (ref 3.5–5.3)
SODIUM: 136 mmol/L (ref 135–146)

## 2015-01-29 ENCOUNTER — Other Ambulatory Visit: Payer: Self-pay | Admitting: Family Medicine

## 2015-03-11 ENCOUNTER — Other Ambulatory Visit: Payer: Self-pay | Admitting: Family Medicine

## 2015-03-17 ENCOUNTER — Encounter: Payer: Self-pay | Admitting: Family Medicine

## 2015-03-17 ENCOUNTER — Ambulatory Visit (INDEPENDENT_AMBULATORY_CARE_PROVIDER_SITE_OTHER): Payer: Medicare Other | Admitting: Family Medicine

## 2015-03-17 VITALS — BP 131/65 | HR 80 | Ht 69.0 in | Wt 218.0 lb

## 2015-03-17 DIAGNOSIS — IMO0001 Reserved for inherently not codable concepts without codable children: Secondary | ICD-10-CM

## 2015-03-17 DIAGNOSIS — G8250 Quadriplegia, unspecified: Secondary | ICD-10-CM | POA: Diagnosis not present

## 2015-03-17 DIAGNOSIS — M1711 Unilateral primary osteoarthritis, right knee: Secondary | ICD-10-CM

## 2015-03-17 DIAGNOSIS — I10 Essential (primary) hypertension: Secondary | ICD-10-CM | POA: Diagnosis not present

## 2015-03-17 MED ORDER — LISINOPRIL-HYDROCHLOROTHIAZIDE 20-12.5 MG PO TABS: ORAL_TABLET | ORAL | Status: DC

## 2015-03-17 MED ORDER — AMLODIPINE BESYLATE 5 MG PO TABS: 5.0000 mg | ORAL_TABLET | Freq: Every day | ORAL | Status: DC

## 2015-03-17 NOTE — Progress Notes (Signed)
   Subjective:    Patient ID: [name], female    DOB: December 22, 1945, 70 y.o.   MRN: [id number]  HPI Hypertension- Pt denies chest pain, SOB, dizziness, or heart palpitations.  Taking meds as directed w/o problems.  Denies medication side effects.  She gained about 5 lbs.    Has noticed a bluish nodule on the palm of the hand. No pain but sore if puts pressur on it.    Still having a lot of knee pain - has a hard time changing position. Says occ hurts but not very painful.  Her legs feel weak. She hasn't not been exercising or walking.  No locking or popping.  She had an injection about 2 years ago.  Then had MRI a year or so ago. Told had OA but not quite at point of replacement. She is just concerned about her LE weakness. Has to use her upper body to push herself up from a chair.    Review of Systems     Objective:   Physical Exam  Constitutional: She is oriented to person, place, and time. She appears well-developed and well-nourished.  HENT:  Head: Normocephalic and atraumatic.  Cardiovascular: Normal rate, regular rhythm and normal heart sounds.   Pulmonary/Chest: Effort normal and breath sounds normal.  Musculoskeletal:  Right knee is much larger than the right.    Neurological: She is alert and oriented to person, place, and time.  Skin: Skin is warm and dry.  Psychiatric: She has a normal mood and affect. Her behavior is normal.          Assessment & Plan:  HTN - well controlled. Continue current regimen. Follow-up in 6 months.  Right knee pain - OA. She wasn't quite at the point of needing knee replacement when she last saw the orthopedist. At certainly be happy to refer her again if she would like to see what they are recommending now. I think we need to first start by really strengthening her quads and hamstrings. They are quite weak. I feel like she has gotten into a pattern of using her upper extremities and not relying on her legs.  Quad weakness - refer to  PT. is not really having pain per se. I think she is just worried about the knee giving out or getting hurt if she tries to put a lot of pressure on it.

## 2015-04-08 ENCOUNTER — Ambulatory Visit (INDEPENDENT_AMBULATORY_CARE_PROVIDER_SITE_OTHER): Payer: Medicare Other | Admitting: Family Medicine

## 2015-04-08 ENCOUNTER — Encounter: Payer: Self-pay | Admitting: Family Medicine

## 2015-04-08 VITALS — BP 136/58 | HR 75 | Temp 98.0°F | Ht 69.0 in | Wt 217.0 lb

## 2015-04-08 DIAGNOSIS — L539 Erythematous condition, unspecified: Secondary | ICD-10-CM

## 2015-04-08 DIAGNOSIS — R21 Rash and other nonspecific skin eruption: Secondary | ICD-10-CM

## 2015-04-08 LAB — CBC WITH DIFFERENTIAL/PLATELET
BASOS PCT: 1 % (ref 0–1)
Basophils Absolute: 0 10*3/uL (ref 0.0–0.1)
EOS ABS: 0.3 10*3/uL (ref 0.0–0.7)
Eosinophils Relative: 6 % — ABNORMAL HIGH (ref 0–5)
HCT: 35.6 % — ABNORMAL LOW (ref 36.0–46.0)
Hemoglobin: 11.6 g/dL — ABNORMAL LOW (ref 12.0–15.0)
Lymphocytes Relative: 45 % (ref 12–46)
Lymphs Abs: 2 10*3/uL (ref 0.7–4.0)
MCH: 27.1 pg (ref 26.0–34.0)
MCHC: 32.6 g/dL (ref 30.0–36.0)
MCV: 83.2 fL (ref 78.0–100.0)
MONO ABS: 0.5 10*3/uL (ref 0.1–1.0)
MONOS PCT: 11 % (ref 3–12)
MPV: 9.3 fL (ref 8.6–12.4)
NEUTROS PCT: 37 % — AB (ref 43–77)
Neutro Abs: 1.6 10*3/uL — ABNORMAL LOW (ref 1.7–7.7)
PLATELETS: 254 10*3/uL (ref 150–400)
RBC: 4.28 MIL/uL (ref 3.87–5.11)
RDW: 14.1 % (ref 11.5–15.5)
WBC: 4.4 10*3/uL (ref 4.0–10.5)

## 2015-04-08 LAB — TSH: TSH: 1.17 m[IU]/L

## 2015-04-08 LAB — ANTISTREPTOLYSIN O TITER

## 2015-04-08 MED ORDER — METHYLPREDNISOLONE SODIUM SUCC 125 MG IJ SOLR
125.0000 mg | Freq: Once | INTRAMUSCULAR | Status: AC
  Administered 2015-04-08: 125 mg via INTRAMUSCULAR

## 2015-04-08 MED ORDER — METHYLPREDNISOLONE ACETATE 40 MG/ML IJ SUSP
40.0000 mg | Freq: Once | INTRAMUSCULAR | Status: AC
  Administered 2015-04-08: 40 mg via INTRAMUSCULAR

## 2015-04-08 NOTE — Progress Notes (Signed)
   Subjective:    Patient ID: [name], female    DOB: 1945/11/27, 70 y.o.   MRN: [id number]  HPI Rash- x 3 days on upper neck moving towards chest, anticubital area bilaterally, L forearm. she stated that the area is burning and itching. she has only used benadryl 2 tabs Q4H which has not helped. no changes in diet,lotions,soaps. she started using a DSMO oil for arthritis on her knees only QD 2wks ago. she states that at the [facility] there is a cleaning solution that is used after use of machines.  rash is mostly on her upper neck , anetcubital fossa.  No cold symptoms or sore throat. No fevers chills or sweats. No worsening or alleviating factors.    Review of Systems     Objective:   Physical Exam  Constitutional: She is oriented to person, place, and time. She appears well-developed and well-nourished.  HENT:  Head: Normocephalic and atraumatic.  Eyes: Conjunctivae and EOM are normal.  Cardiovascular: Normal rate.   Pulmonary/Chest: Effort normal.    Musculoskeletal:       Arms: Neurological: She is alert and oriented to person, place, and time.  Skin: Skin is dry. No pallor.  Psychiatric: She has a normal mood and affect. Her behavior is normal.  Vitals reviewed.         Assessment & Plan:  Erythematous rash-most consistent with erythroderma that she does not have any scale or exfoliation of the skin. Consider this could be a drug reaction that she is not changed any of her medications recently or started new medications. She's not taking anything else over-the-counter. Also consider contact dermatitis but she has not changed any lotions perfume soaps detergents etc. Skin scraping performed that I think fungal is less likely. Treated with Solu-Medrol and Depo-Medrol per patient preference to help with itching and burning. Avoid taking hot showers. Continue to moisturize the skin well. If not resolving on its own next week then consider stopping her lisinopril HCT.  Will  check CBC, ASO, CMV and EBV. Okay to continue Benadryl as needed.

## 2015-04-09 LAB — KOH PREP: RESULT - KOH: NONE SEEN

## 2015-04-11 LAB — EPSTEIN-BARR VIRUS VCA, IGG: EBV VCA IgG: 418 U/mL — ABNORMAL HIGH (ref <18.0)

## 2015-04-11 LAB — EPSTEIN-BARR VIRUS VCA, IGM: EBV VCA IgM: <10 U/mL (ref <36.0)

## 2015-04-12 LAB — CMV IGM

## 2015-04-20 ENCOUNTER — Other Ambulatory Visit: Payer: Self-pay | Admitting: Family Medicine

## 2015-05-16 ENCOUNTER — Ambulatory Visit: Payer: Medicare Other | Admitting: Family Medicine

## 2015-05-24 ENCOUNTER — Other Ambulatory Visit: Payer: Self-pay

## 2015-05-24 DIAGNOSIS — Z1231 Encounter for screening mammogram for malignant neoplasm of breast: Secondary | ICD-10-CM

## 2015-06-13 ENCOUNTER — Ambulatory Visit: Payer: 59 | Admitting: Family Medicine

## 2015-06-29 ENCOUNTER — Ambulatory Visit (INDEPENDENT_AMBULATORY_CARE_PROVIDER_SITE_OTHER): Payer: Medicare Other | Admitting: Women's Health

## 2015-06-29 ENCOUNTER — Encounter: Payer: Self-pay | Admitting: Women's Health

## 2015-06-29 ENCOUNTER — Ambulatory Visit
Admission: RE | Admit: 2015-06-29 | Discharge: 2015-06-29 | Disposition: A | Discharge status: Home / Self Care | Payer: 59 | Source: Ambulatory Visit

## 2015-06-29 VITALS — BP 136/70 | Ht 69.0 in | Wt 222.0 lb

## 2015-06-29 DIAGNOSIS — Z1231 Encounter for screening mammogram for malignant neoplasm of breast: Secondary | ICD-10-CM | POA: Diagnosis not present

## 2015-06-29 DIAGNOSIS — Z01419 Encounter for gynecological examination (general) (routine) without abnormal findings: Secondary | ICD-10-CM

## 2015-06-29 NOTE — Progress Notes (Signed)
[name] 1945-05-21 [id number]    History:    Presents for breast and pelvic exam. TAH for fibroids on no HRT. 2013 DEXA +3.8 hip, spine 1.7. Normal Pap and mammogram history. 2013 negative colonoscopy. Primary care manages hypertension and hypercholesterolemia. Vaccines current.   Past medical history, past surgical history, family history and social history were all reviewed and documented in the EPIC chart. Retired Psychologist, occupationalbanker. Parents, sister, brother and numerous family members hypertension.  ROS:  A ROS was performed and pertinent positives and negatives are included.  Exam:  Filed Vitals:   06/29/15 1033  BP: 136/70    General appearance:  Normal Thyroid:  Symmetrical, normal in size, without palpable masses or nodularity. Respiratory  Auscultation:  Clear without wheezing or rhonchi Cardiovascular  Auscultation:  Regular rate, without rubs, murmurs or gallops  Edema/varicosities:  Not grossly evident Abdominal  Soft,nontender, without masses, guarding or rebound.  Liver/spleen:  No organomegaly noted  Hernia:  None appreciated  Skin  Inspection:  Grossly normal   Breasts: Examined lying and sitting. Pendulous, status post breast reduction    Right: Without masses, retractions, discharge or axillary adenopathy.     Left: Without masses, retractions, discharge or axillary adenopathy. Gentitourinary   Inguinal/mons:  Normal without inguinal adenopathy  External genitalia:  Normal  BUS/Urethra/Skene's glands:  Normal  Vagina:  Normal  Cervix:  And uterus absent  Adnexa/parametria:     Rt: Without masses or tenderness.   Lt: Without masses or tenderness.  Anus and perineum: Normal  Digital rectal exam: Normal sphincter tone without palpated masses or tenderness  Assessment/Plan:  70 y.o. MBF G0 for breast and pelvic exam.  TAH for fibroids on no HRT Hypertension/hypercholesterolemia-primary care manages labs and meds Obesity  Plan: SBE's, continue annual 3-D  screening mammogram, calcium rich diet, vitamin D 1000 daily encouraged. Home safety, fall prevention and importance of weightbearing exercise reviewed. Encouraged to increase exercise, decrease calories, Weight Watchers encouraged for weight loss.  Lisa ChallengerYOUNG,[name] WHNP, 12:12 PM 06/29/2015

## 2015-06-29 NOTE — Patient Instructions (Signed)
DASH Eating Plan DASH stands for "Dietary Approaches to Stop Hypertension." The DASH eating plan is a healthy eating plan that has been shown to reduce high blood pressure (hypertension). Additional health benefits may include reducing the risk of type 2 diabetes mellitus, heart disease, and stroke. The DASH eating plan may also help with weight loss. WHAT DO I NEED TO KNOW ABOUT THE DASH EATING PLAN? For the DASH eating plan, you will follow these general guidelines:  Choose foods with a percent daily value for sodium of less than 5% (as listed on the food label).  Use salt-free seasonings or herbs instead of table salt or sea salt.  Check with your health care provider or pharmacist before using salt substitutes.  Eat lower-sodium products, often labeled as "lower sodium" or "no salt added."  Eat fresh foods.  Eat more vegetables, fruits, and low-fat dairy products.  Choose whole grains. Look for the word "whole" as the first word in the ingredient list.  Choose fish and skinless chicken or [place] more often than red meat. Limit fish, poultry, and meat to 6 oz (170 g) each day.  Limit sweets, desserts, sugars, and sugary drinks.  Choose heart-healthy fats.  Limit cheese to 1 oz (28 g) per day.  Eat more home-cooked food and less restaurant, buffet, and fast food.  Limit fried foods.  Cook foods using methods other than frying.  Limit canned vegetables. If you do use them, rinse them well to decrease the sodium.  When eating at a restaurant, ask that your food be prepared with less salt, or no salt if possible. WHAT FOODS CAN I EAT? Seek help from a dietitian for individual calorie needs. Grains Whole grain or whole wheat bread. Brown rice. Whole grain or whole wheat pasta. Quinoa, bulgur, and whole grain cereals. Low-sodium cereals. Corn or whole wheat flour tortillas. Whole grain cornbread. Whole grain crackers. Low-sodium crackers. Vegetables Fresh or frozen vegetables  (raw, steamed, roasted, or grilled). Low-sodium or reduced-sodium tomato and vegetable juices. Low-sodium or reduced-sodium tomato sauce and paste. Low-sodium or reduced-sodium canned vegetables.  Fruits All fresh, canned (in natural juice), or frozen fruits. Meat and Other Protein Products Ground beef (85% or leaner), grass-fed beef, or beef trimmed of fat. Skinless chicken or [place]. Ground chicken or [place]. Pork trimmed of fat. All fish and seafood. Eggs. Dried beans, peas, or lentils. Unsalted nuts and seeds. Unsalted canned beans. Dairy Low-fat dairy products, such as skim or 1% milk, 2% or reduced-fat cheeses, low-fat ricotta or cottage cheese, or plain low-fat yogurt. Low-sodium or reduced-sodium cheeses. Fats and Oils Tub margarines without trans fats. Light or reduced-fat mayonnaise and salad dressings (reduced sodium). Avocado. Safflower, olive, or canola oils. Natural peanut or almond butter. Other Unsalted popcorn and pretzels. The items listed above may not be a complete list of recommended foods or beverages. Contact your dietitian for more options. WHAT FOODS ARE NOT RECOMMENDED? Grains White bread. White pasta. White rice. Refined cornbread. Bagels and croissants. Crackers that contain trans fat. Vegetables Creamed or fried vegetables. Vegetables in a cheese sauce. Regular canned vegetables. Regular canned tomato sauce and paste. Regular tomato and vegetable juices. Fruits Dried fruits. Canned fruit in light or heavy syrup. Fruit juice. Meat and Other Protein Products Fatty cuts of meat. Ribs, chicken wings, bacon, sausage, bologna, salami, chitterlings, fatback, hot dogs, bratwurst, and packaged luncheon meats. Salted nuts and seeds. Canned beans with salt. Dairy Whole or 2% milk, cream, half-and-half, and cream cheese. Whole-fat or sweetened yogurt. Full-fat  cheeses or blue cheese. Nondairy creamers and whipped toppings. Processed cheese, cheese spreads, or cheese  curds. Condiments Onion and garlic salt, seasoned salt, table salt, and sea salt. Canned and packaged gravies. Worcestershire sauce. Tartar sauce. Barbecue sauce. Teriyaki sauce. Soy sauce, including reduced sodium. Steak sauce. Fish sauce. Oyster sauce. Cocktail sauce. Horseradish. Ketchup and mustard. Meat flavorings and tenderizers. Bouillon cubes. Hot sauce. Tabasco sauce. Marinades. Taco seasonings. Relishes. Fats and Oils Butter, stick margarine, lard, shortening, ghee, and bacon fat. Coconut, palm kernel, or palm oils. Regular salad dressings. Other Pickles and olives. Salted popcorn and pretzels. The items listed above may not be a complete list of foods and beverages to avoid. Contact your dietitian for more information. WHERE CAN I FIND MORE INFORMATION? National Heart, Lung, and Blood Institute: travelstabloid.com   This information is not intended to replace advice given to you by your health care provider. Make sure you discuss any questions you have with your health care provider.   Document Released: 02/01/2011 Document Revised: 03/05/2014 Document Reviewed: 12/17/2012 Elsevier Interactive Patient Education [employer]. Menopause is a normal process in which your reproductive ability comes to an end. This process happens gradually over a span of months to years, usually between the ages of 65 and 22. Menopause is complete when you have missed 12 consecutive menstrual periods. It is important to talk with your health care provider about some of the most common conditions that affect postmenopausal women, such as heart disease, cancer, and bone loss (osteoporosis). Adopting a healthy lifestyle and getting preventive care can help to promote your health and wellness. Those actions can also lower your chances of developing some of these common conditions. WHAT SHOULD I KNOW ABOUT MENOPAUSE? During menopause, you may experience a number of symptoms,  such as:  Moderate-to-severe hot flashes.  Night sweats.  Decrease in sex drive.  Mood swings.  Headaches.  Tiredness.  Irritability.  Memory problems.  Insomnia. Choosing to treat or not to treat menopausal changes is an individual decision that you make with your health care provider. WHAT SHOULD I KNOW ABOUT HORMONE REPLACEMENT THERAPY AND SUPPLEMENTS? Hormone therapy products are effective for treating symptoms that are associated with menopause, such as hot flashes and night sweats. Hormone replacement carries certain risks, especially as you become older. If you are thinking about using estrogen or estrogen with progestin treatments, discuss the benefits and risks with your health care provider. WHAT SHOULD I KNOW ABOUT HEART DISEASE AND STROKE? Heart disease, heart attack, and stroke become more likely as you age. This may be due, in part, to the hormonal changes that your body experiences during menopause. These can affect how your body processes dietary fats, triglycerides, and cholesterol. Heart attack and stroke are both medical emergencies. There are many things that you can do to help prevent heart disease and stroke:  Have your blood pressure checked at least every 1-2 years. High blood pressure causes heart disease and increases the risk of stroke.  If you are 79-56 years old, ask your health care provider if you should take aspirin to prevent a heart attack or a stroke.  Do not use any tobacco products, including cigarettes, chewing tobacco, or electronic cigarettes. If you need help quitting, ask your health care provider.  It is important to eat a healthy diet and maintain a healthy weight.  Be sure to include plenty of vegetables, fruits, low-fat dairy products, and lean protein.  Avoid eating foods that are high in solid fats, added  sugars, or salt (sodium).  Get regular exercise. This is one of the most important things that you can do for your  health.  Try to exercise for at least 150 minutes each week. The type of exercise that you do should increase your heart rate and make you sweat. This is known as moderate-intensity exercise.  Try to do strengthening exercises at least twice each week. Do these in addition to the moderate-intensity exercise.  Know your numbers.Ask your health care provider to check your cholesterol and your blood glucose. Continue to have your blood tested as directed by your health care provider. WHAT SHOULD I KNOW ABOUT CANCER SCREENING? There are several types of cancer. Take the following steps to reduce your risk and to catch any cancer development as early as possible. Breast Cancer  Practice breast self-awareness.  This means understanding how your breasts normally appear and feel.  It also means doing regular breast self-exams. Let your health care provider know about any changes, no matter how small.  If you are 69 or older, have a clinician do a breast exam (clinical breast exam or CBE) every year. Depending on your age, family history, and medical history, it may be recommended that you also have a yearly breast X-ray (mammogram).  If you have a family history of breast cancer, talk with your health care provider about genetic screening.  If you are at high risk for breast cancer, talk with your health care provider about having an MRI and a mammogram every year.  Breast cancer (BRCA) gene test is recommended for women who have family members with BRCA-related cancers. Results of the assessment will determine the need for genetic counseling and BRCA1 and for BRCA2 testing. BRCA-related cancers include these types:  Breast. This occurs in males or females.  Ovarian.  Tubal. This may also be called fallopian tube cancer.  Cancer of the abdominal or pelvic lining (peritoneal cancer).  Prostate.  Pancreatic. Cervical, Uterine, and Ovarian Cancer Your health care provider may recommend  that you be screened regularly for cancer of the pelvic organs. These include your ovaries, uterus, and vagina. This screening involves a pelvic exam, which includes checking for microscopic changes to the surface of your cervix (Pap test).  For women ages 21-65, health care providers may recommend a pelvic exam and a Pap test every three years. For women ages 96-65, they may recommend the Pap test and pelvic exam, combined with testing for human papilloma virus (HPV), every five years. Some types of HPV increase your risk of cervical cancer. Testing for HPV may also be done on women of any age who have unclear Pap test results.  Other health care providers may not recommend any screening for nonpregnant women who are considered low risk for pelvic cancer and have no symptoms. Ask your health care provider if a screening pelvic exam is right for you.  If you have had past treatment for cervical cancer or a condition that could lead to cancer, you need Pap tests and screening for cancer for at least 20 years after your treatment. If Pap tests have been discontinued for you, your risk factors (such as having a new sexual partner) need to be reassessed to determine if you should start having screenings again. Some women have medical problems that increase the chance of getting cervical cancer. In these cases, your health care provider may recommend that you have screening and Pap tests more often.  If you have a family history of uterine  cancer or ovarian cancer, talk with your health care provider about genetic screening.  If you have vaginal bleeding after reaching menopause, tell your health care provider.  There are currently no reliable tests available to screen for ovarian cancer. Lung Cancer Lung cancer screening is recommended for adults 35-29 years old who are at high risk for lung cancer because of a history of smoking. A yearly low-dose CT scan of the lungs is recommended if you:  Currently  smoke.  Have a history of at least 30 pack-years of smoking and you currently smoke or have quit within the past 15 years. A pack-year is smoking an average of one pack of cigarettes per day for one year. Yearly screening should:  Continue until it has been 15 years since you quit.  Stop if you develop a health problem that would prevent you from having lung cancer treatment. Colorectal Cancer  This type of cancer can be detected and can often be prevented.  Routine colorectal cancer screening usually begins at age 43 and continues through age 48.  If you have risk factors for colon cancer, your health care provider may recommend that you be screened at an earlier age.  If you have a family history of colorectal cancer, talk with your health care provider about genetic screening.  Your health care provider may also recommend using home test kits to check for hidden blood in your stool.  A small camera at the end of a tube can be used to examine your colon directly (sigmoidoscopy or colonoscopy). This is done to check for the earliest forms of colorectal cancer.  Direct examination of the colon should be repeated every 5-10 years until age 44. However, if early forms of precancerous polyps or small growths are found or if you have a family history or genetic risk for colorectal cancer, you may need to be screened more often. Skin Cancer  Check your skin from head to toe regularly.  Monitor any moles. Be sure to tell your health care provider:  About any new moles or changes in moles, especially if there is a change in a mole's shape or color.  If you have a mole that is larger than the size of a pencil eraser.  If any of your family members has a history of skin cancer, especially at a young age, talk with your health care provider about genetic screening.  Always use sunscreen. Apply sunscreen liberally and repeatedly throughout the day.  Whenever you are outside, protect  yourself by wearing long sleeves, pants, a wide-brimmed hat, and sunglasses. WHAT SHOULD I KNOW ABOUT OSTEOPOROSIS? Osteoporosis is a condition in which bone destruction happens more quickly than new bone creation. After menopause, you may be at an increased risk for osteoporosis. To help prevent osteoporosis or the bone fractures that can happen because of osteoporosis, the following is recommended:  If you are 27-33 years old, get at least 1,000 mg of calcium and at least 600 mg of vitamin D per day.  If you are older than age 69 but younger than age 61, get at least 1,200 mg of calcium and at least 600 mg of vitamin D per day.  If you are older than age 83, get at least 1,200 mg of calcium and at least 800 mg of vitamin D per day. Smoking and excessive alcohol intake increase the risk of osteoporosis. Eat foods that are rich in calcium and vitamin D, and do weight-bearing exercises several times each week as  directed by your health care provider. WHAT SHOULD I KNOW ABOUT HOW MENOPAUSE AFFECTS [place]? Depression may occur at any age, but it is more common as you become older. Common symptoms of depression include:  Low or sad mood.  Changes in sleep patterns.  Changes in appetite or eating patterns.  Feeling an overall lack of motivation or enjoyment of activities that you previously enjoyed.  Frequent crying spells. Talk with your health care provider if you think that you are experiencing depression. WHAT SHOULD I KNOW ABOUT IMMUNIZATIONS? It is important that you get and maintain your immunizations. These include:  Tetanus, diphtheria, and pertussis (Tdap) booster vaccine.  Influenza every year before the flu season begins.  Pneumonia vaccine.  Shingles vaccine. Your health care provider may also recommend other immunizations.   This information is not intended to replace advice given to you by your health care provider. Make sure you discuss any questions you have  with your health care provider.   Document Released: 04/06/2005 Document Revised: 03/05/2014 Document Reviewed: 10/15/2013 Elsevier Interactive Patient Education [employer].

## 2015-09-20 ENCOUNTER — Ambulatory Visit: Payer: Medicare Other | Admitting: Family Medicine

## 2015-09-24 ENCOUNTER — Other Ambulatory Visit: Payer: Self-pay | Admitting: Family Medicine

## 2015-09-27 ENCOUNTER — Other Ambulatory Visit: Payer: Self-pay | Admitting: *Deleted

## 2015-09-27 MED ORDER — LISINOPRIL-HYDROCHLOROTHIAZIDE 20-12.5 MG PO TABS: ORAL_TABLET | ORAL | 0 refills | Status: DC

## 2015-10-17 ENCOUNTER — Encounter: Payer: Medicare Other | Admitting: Family Medicine

## 2015-10-20 ENCOUNTER — Encounter: Payer: Self-pay | Admitting: Family Medicine

## 2015-10-20 ENCOUNTER — Ambulatory Visit (INDEPENDENT_AMBULATORY_CARE_PROVIDER_SITE_OTHER): Payer: Medicare Other | Admitting: Family Medicine

## 2015-10-20 VITALS — BP 132/66 | HR 76 | Ht 69.0 in | Wt 218.0 lb

## 2015-10-20 DIAGNOSIS — N1832 Chronic kidney disease, stage 3b: Secondary | ICD-10-CM

## 2015-10-20 DIAGNOSIS — Z0000 Encounter for general adult medical examination without abnormal findings: Secondary | ICD-10-CM | POA: Diagnosis not present

## 2015-10-20 DIAGNOSIS — Z23 Encounter for immunization: Secondary | ICD-10-CM | POA: Diagnosis not present

## 2015-10-20 DIAGNOSIS — R7301 Impaired fasting glucose: Secondary | ICD-10-CM

## 2015-10-20 DIAGNOSIS — I10 Essential (primary) hypertension: Secondary | ICD-10-CM | POA: Diagnosis not present

## 2015-10-20 DIAGNOSIS — N183 Chronic kidney disease, stage 3 (moderate): Secondary | ICD-10-CM

## 2015-10-20 DIAGNOSIS — E785 Hyperlipidemia, unspecified: Secondary | ICD-10-CM

## 2015-10-20 NOTE — Progress Notes (Signed)
Subjective:   [name] is a 70 y.o. female who presents for Medicare Annual (Subsequent) preventive examination.  Review of Systems:  Comprehensive ROS is negative.        Objective:     Vitals: BP 132/66 (BP Location: Left Arm, Patient Position: Sitting, Cuff Size: Normal)   Pulse 76   Ht 5\' 9"  (1.753 m)   Wt 218 lb (98.9 kg)   SpO2 100%   BMI 32.19 kg/m   Body mass index is 32.19 kg/m.   Tobacco History  Smoking Status  . Never Smoker  Smokeless Tobacco  . Never Used     Counseling given: Not Answered   Past Medical History:  Diagnosis Date  . Diverticulosis   . Hiatal hernia   . Hyperlipidemia    borderline  . Hypertension    Past Surgical History:  Procedure Laterality Date  . ABDOMINAL HYSTERECTOMY     TAH Pt. unsure if BSO  . BREAST REDUCTION SURGERY  1994  . BREAST SURGERY     Reduction  . LIPOSUCTION     Family History  Problem Relation Age of Onset  . Breast cancer Sister 9    COD breast cancer and pneumonia  . Hypertension Sister   . Heart attack Father   . Hypertension Father   . Hypertension Mother   . Kidney disease Mother   . Hypertension Brother   . Colon cancer Neg Hx   . Esophageal cancer Neg Hx   . Rectal cancer Neg Hx   . Stomach cancer Neg Hx   . Breast cancer Other     ages 72 and 51--2 nieces   History  Sexual Activity  . Sexual activity: Not Currently  . Partners: Male    Comment: HYST-1st intercourse 37 yo-5 partners    Outpatient Encounter Prescriptions as of 10/20/2015  Medication Sig  . AMBULATORY NON FORMULARY MEDICATION Medication Name: DSMO  . amLODipine (NORVASC) 5 MG tablet Take 1 tablet (5 mg total) by mouth daily.  Marland Kitchen aspirin 81 MG tablet Take 81 mg by mouth daily.    . Calcium Carbonate-Vitamin D (CALCIUM 600+D) 600-200 MG-UNIT TABS Take by mouth.    Marland Kitchen lisinopril-hydrochlorothiazide (PRINZIDE,ZESTORETIC) 20-12.5 MG tablet TAKE 1 TABLET BY MOUTH DAILY. DUE FOR A F/U APPT ON BP  . Multiple  Vitamins-Minerals (MULTIVITAMIN PO) Take by mouth.  . Omega-3 Fatty Acids (FISH OIL) 1000 MG CPDR Take by mouth.    Marland Kitchen OVER THE COUNTER MEDICATION Curamin Extra Strength PO  . OVER THE COUNTER MEDICATION Joint Astin PO  . OVER THE COUNTER MEDICATION Skeletal Strength PO  . Probiotic Product (PROBIOTIC DAILY PO) Take by mouth.  . [DISCONTINUED] lisinopril-hydrochlorothiazide (PRINZIDE,ZESTORETIC) 20-12.5 MG tablet TAKE 1 TABLET BY MOUTH EVERY DAY  Please schedule 6 month f/u   No facility-administered encounter medications on file as of 10/20/2015.     Activities of Daily Living In your present state of health, do you have any difficulty performing the following activities: 10/20/2015  Hearing? N  Vision? N  Difficulty concentrating or making decisions? N  Walking or climbing stairs? N  Dressing or bathing? N  Doing errands, shopping? N  Some recent data might be hidden    Patient Care Team: Agapito Games, MD as PCP - General Harrington Challenger, NP as Nurse Practitioner (Obstetrics and Gynecology)    Assessment:     Exercise Activities and Dietary recommendations Current Exercise Habits: The patient does not participate in regular exercise  at present  Goals    . Exercise 3x per week (30 min per time)          Plans to start exercising with JB and/or her sister.      . Weight (lb) < 200 lb (90.7 kg) (pt-stated)          Exercise more.       Fall Risk Fall Risk  10/20/2015 06/10/2014 12/02/2012  Falls in the past year? No No No   Depression Screen PHQ 2/9 Scores 10/20/2015 06/10/2014 01/25/2014 12/02/2012  PHQ - 2 Score 0 0 0 0     Cognitive Testing No flowsheet data found.  Normal 6 CIT  Immunization History  Administered Date(s) Administered  . Influenza Split 01/30/2011  . Influenza,inj,Quad PF,36+ Mos 12/02/2012, 11/17/2013, 12/13/2014, 10/20/2015  . Pneumococcal Conjugate-13 06/10/2014  . Pneumococcal Polysaccharide-23 01/30/2011  . Td 02/27/1995, 02/27/2007    Screening Tests Health Maintenance  Topic Date Due  . Hepatitis C Screening  1945-03-18  . ZOSTAVAX  10/19/2016 (Originally 06/23/2005)  . TETANUS/TDAP  02/26/2017  . MAMMOGRAM  06/28/2017  . COLONOSCOPY  12/05/2021  . INFLUENZA VACCINE  Completed  . DEXA SCAN  Completed  . PNA vac Low Risk Adult  Completed      Plan:   Medicare Wellness Exam   During the course of the visit the patient was educated and counseled about the following appropriate screening and preventive services:   Vaccines to include Pneumoccal, Influenza, Zostavax, HCV  Cardiovascular Disease  Colorectal cancer screening - UTD    Bone density screening - done 2013  Diabetes screening - due for A1C   Glaucoma screening  Mammography/PAP  Nutrition counseling    Patient Instructions (the written plan) was given to the patient.   Penny Frisbie, MD  10/20/2015

## 2015-10-21 ENCOUNTER — Other Ambulatory Visit: Payer: Self-pay

## 2015-10-21 LAB — COMPLETE METABOLIC PANEL WITH GFR
ALT: 17 U/L (ref 6–29)
AST: 22 U/L (ref 10–35)
Albumin: 4.5 g/dL (ref 3.6–5.1)
Alkaline Phosphatase: 77 U/L (ref 33–130)
BUN: 18 mg/dL (ref 7–25)
CO2: 30 mmol/L (ref 20–31)
Calcium: 9.6 mg/dL (ref 8.6–10.4)
Chloride: 100 mmol/L (ref 98–110)
Creat: 1.08 mg/dL — ABNORMAL HIGH (ref 0.60–0.93)
GFR, EST AFRICAN AMERICAN: 60 mL/min (ref >=60)
GFR, EST NON AFRICAN AMERICAN: 52 mL/min — AB (ref >=60)
GLUCOSE: 94 mg/dL (ref 65–99)
POTASSIUM: 4.2 mmol/L (ref 3.5–5.3)
SODIUM: 139 mmol/L (ref 135–146)
Total Bilirubin: 0.6 mg/dL (ref 0.2–1.2)
Total Protein: 7.6 g/dL (ref 6.1–8.1)

## 2015-10-21 LAB — LIPID PANEL
CHOL/HDL RATIO: 3.7 ratio (ref <=5.0)
Cholesterol: 190 mg/dL (ref 125–200)
HDL: 51 mg/dL (ref >=46)
LDL CALC: 110 mg/dL (ref <130)
TRIGLYCERIDES: 143 mg/dL (ref <150)
VLDL: 29 mg/dL (ref <30)

## 2015-10-21 LAB — HEMOGLOBIN A1C
HEMOGLOBIN A1C: 5.7 % — AB (ref <5.7)
Mean Plasma Glucose: 117 mg/dL

## 2015-10-28 ENCOUNTER — Other Ambulatory Visit: Payer: Self-pay | Admitting: Physician Assistant

## 2015-12-05 ENCOUNTER — Other Ambulatory Visit: Payer: Self-pay | Admitting: Physician Assistant

## 2015-12-06 ENCOUNTER — Telehealth: Payer: Self-pay | Admitting: *Deleted

## 2015-12-06 NOTE — Telephone Encounter (Signed)
Pt called asking for an appt to be seen for cyst under her arm. She had lvm with regards to this. When I called her back I had to leave her a vm and I realized that she had already scheduled an appt with Dr. Lyn HollingsheadAlexander tomorrow. Laureen Ochs.Galina Haddox, Viann Shoveonya Lynetta

## 2015-12-07 ENCOUNTER — Ambulatory Visit: Payer: Medicare Other | Admitting: Osteopathic Medicine

## 2015-12-07 ENCOUNTER — Ambulatory Visit (INDEPENDENT_AMBULATORY_CARE_PROVIDER_SITE_OTHER): Payer: Medicare Other | Admitting: Family Medicine

## 2015-12-07 DIAGNOSIS — L723 Sebaceous cyst: Secondary | ICD-10-CM

## 2015-12-07 DIAGNOSIS — L089 Local infection of the skin and subcutaneous tissue, unspecified: Secondary | ICD-10-CM

## 2015-12-07 NOTE — Patient Instructions (Signed)
Thank you for coming in today. Follow up with Dr Linford Arnold in 1 month.    Abscess An abscess is an infected area that contains a collection of pus and debris.It can occur in almost any part of the body. An abscess is also known as a furuncle or boil. CAUSES  An abscess occurs when tissue gets infected. This can occur from blockage of oil or sweat glands, infection of hair follicles, or a minor injury to the skin. As the body tries to fight the infection, pus collects in the area and creates pressure under the skin. This pressure causes pain. People with weakened immune systems have difficulty fighting infections and get certain abscesses more often.  SYMPTOMS Usually an abscess develops on the skin and becomes a painful mass that is red, warm, and tender. If the abscess forms under the skin, you may feel a moveable soft area under the skin. Some abscesses break open (rupture) on their own, but most will continue to get worse without care. The infection can spread deeper into the body and eventually into the bloodstream, causing you to feel ill.  DIAGNOSIS  Your caregiver will take your medical history and perform a physical exam. A sample of fluid may also be taken from the abscess to determine what is causing your infection. TREATMENT  Your caregiver may prescribe antibiotic medicines to fight the infection. However, taking antibiotics alone usually does not cure an abscess. Your caregiver may need to make a small cut (incision) in the abscess to drain the pus. In some cases, gauze is packed into the abscess to reduce pain and to continue draining the area. HOME CARE INSTRUCTIONS   Only take over-the-counter or prescription medicines for pain, discomfort, or fever as directed by your caregiver.  If you were prescribed antibiotics, take them as directed. Finish them even if you start to feel better.  If gauze is used, follow your caregiver's directions for changing the gauze.  To avoid  spreading the infection:  Keep your draining abscess covered with a bandage.  Wash your hands well.  Do not share personal care items, towels, or whirlpools with others.  Avoid skin contact with others.  Keep your skin and clothes clean around the abscess.  Keep all follow-up appointments as directed by your caregiver. SEEK MEDICAL CARE IF:   You have increased pain, swelling, redness, fluid drainage, or bleeding.  You have muscle aches, chills, or a general ill feeling.  You have a fever. MAKE SURE YOU:   Understand these instructions.  Will watch your condition.  Will get help right away if you are not doing well or get worse.   This information is not intended to replace advice given to you by your health care provider. Make sure you discuss any questions you have with your health care provider.   Document Released: 11/22/2004 Document Revised: 08/14/2011 Document Reviewed: 04/27/2011 Elsevier Interactive Patient Education 2016 Elsevier Inc.   Epidermal Cyst An epidermal cyst is sometimes called a sebaceous cyst, epidermal inclusion cyst, or infundibular cyst. These cysts usually contain a substance that looks "pasty" or "cheesy" and may have a bad smell. This substance is a protein called keratin. Epidermal cysts are usually found on the face, neck, or trunk. They may also occur in the vaginal area or other parts of the genitalia of both men and women. Epidermal cysts are usually small, painless, slow-growing bumps or lumps that move freely under the skin. It is important not to try to pop them.  This may cause an infection and lead to tenderness and swelling. CAUSES  Epidermal cysts may be caused by a deep penetrating injury to the skin or a plugged hair follicle, often associated with acne. SYMPTOMS  Epidermal cysts can become inflamed and cause:  Redness.  Tenderness.  Increased temperature of the skin over the bumps or lumps.  Grayish-white, bad smelling  material that drains from the bump or lump. DIAGNOSIS  Epidermal cysts are easily diagnosed by your caregiver during an exam. Rarely, a tissue sample (biopsy) may be taken to rule out other conditions that may resemble epidermal cysts. TREATMENT   Epidermal cysts often get better and disappear on their own. They are rarely ever cancerous.  If a cyst becomes infected, it may become inflamed and tender. This may require opening and draining the cyst. Treatment with antibiotics may be necessary. When the infection is gone, the cyst may be removed with minor surgery.  Small, inflamed cysts can often be treated with antibiotics or by injecting steroid medicines.  Sometimes, epidermal cysts become large and bothersome. If this happens, surgical removal in your caregiver's office may be necessary. HOME CARE INSTRUCTIONS  Only take over-the-counter or prescription medicines as directed by your caregiver.  Take your antibiotics as directed. Finish them even if you start to feel better. SEEK MEDICAL CARE IF:   Your cyst becomes tender, red, or swollen.  Your condition is not improving or is getting worse.  You have any other questions or concerns. MAKE SURE YOU:  Understand these instructions.  Will watch your condition.  Will get help right away if you are not doing well or get worse.   This information is not intended to replace advice given to you by your health care provider. Make sure you discuss any questions you have with your health care provider.   Document Released: 01/14/2004 Document Revised: 05/07/2011 Document Reviewed: 08/21/2010 Elsevier Interactive Patient Education [employer] Elsevier Inc.

## 2015-12-07 NOTE — Progress Notes (Signed)
[name] is a 70 y.o. female who [facility] today for left axillary mass. Patient notes a postinjury mass that was first identified about 2 months ago during a wellness visit. It's been a symptomatic until recently. Over the last few days it has enlarged and become discharging foul-smelling fluid. It has become tender as well. No fevers or chills. No treatment tried yet.   Past Medical History:  Diagnosis Date  . Diverticulosis   . Hiatal hernia   . Hyperlipidemia    borderline  . Hypertension    Past Surgical History:  Procedure Laterality Date  . ABDOMINAL HYSTERECTOMY     TAH Pt. unsure if BSO  . BREAST REDUCTION SURGERY  1994  . BREAST SURGERY     Reduction  . LIPOSUCTION     Social History  Substance Use Topics  . Smoking status: Never Smoker  . Smokeless tobacco: Never Used  . Alcohol use No   family history includes Breast cancer in her other; Breast cancer (age of onset: 6562) in her sister; Heart attack in her father; Hypertension in her brother, father, mother, and sister; Kidney disease in her mother.  ROS as above:  Medications: Current Outpatient Prescriptions  Medication Sig Dispense Refill  . AMBULATORY NON FORMULARY MEDICATION Medication Name: DSMO    . amLODipine (NORVASC) 5 MG tablet Take 1 tablet (5 mg total) by mouth daily. 90 tablet 1  . aspirin 81 MG tablet Take 81 mg by mouth daily.      . Calcium Carbonate-Vitamin D (CALCIUM 600+D) 600-200 MG-UNIT TABS Take by mouth.      Marland Kitchen. lisinopril-hydrochlorothiazide (PRINZIDE,ZESTORETIC) 20-12.5 MG tablet TAKE 1 TABLET BY MOUTH EVERY DAY 90 tablet 1  . Multiple Vitamins-Minerals (MULTIVITAMIN PO) Take by mouth.    . Omega-3 Fatty Acids (FISH OIL) 1000 MG CPDR Take by mouth.      Marland Kitchen. OVER THE COUNTER MEDICATION Curamin Extra Strength PO    . OVER THE COUNTER MEDICATION Joint  Astin PO    . OVER THE COUNTER MEDICATION Skeletal Strength PO    . Probiotic Product (PROBIOTIC DAILY PO) Take by mouth.     No current facility-administered medications for this visit.    Allergies  Allergen Reactions  . Penicillins Rash    REACTION: rash     Exam:  BP (!) 148/65   Pulse 88   Wt 222 lb (100.7 kg)   BMI 32.78 kg/m  Gen: Well NAD Left axilla: Fluctuant tender cystic structure left axilla approximately 1 cm across. Small amount of pus is expressible.    Abscess incision and drainage. Consent obtained and timeout performed. Skin cleaned with alcohol, and cold spray applied. 2 mL of lidocaine with epi injected achieving good anesthesia. Skin was again cleaned with alcohol. A sharp incision was made to the area of fluctuance. The incision was widened and pus was expressed. Pus was cultured. Blunt dissection was used to break up loculations. Further pus was expressed. Patient tolerated the procedure well. A dressing was applied    No results found for this or any previous visit (from the past 24 hour(s)). No results found.    Assessment and Plan: 70 y.o. female with infected epidermoid cyst of the left axilla. Draining culture today. No empiric antibiotics. Follow-up with PCP in about a month to discuss definitive removal when not infected. Very doubtful for any other etiology   Orders Placed This Encounter  Procedures  . Culture, routine-abscess    Skin left axilla    Discussed warning signs or symptoms. Please see discharge instructions. Patient expresses understanding.

## 2015-12-10 LAB — CULTURE, ROUTINE-ABSCESS
GRAM STAIN: NONE SEEN
GRAM STAIN: NONE SEEN
Gram Stain: NONE SEEN
Organism ID, Bacteria: NORMAL

## 2015-12-13 ENCOUNTER — Ambulatory Visit: Payer: Medicare Other | Admitting: Family Medicine

## 2015-12-14 ENCOUNTER — Encounter: Payer: Self-pay | Admitting: Family Medicine

## 2015-12-14 ENCOUNTER — Ambulatory Visit: Payer: Medicare Other | Admitting: Family Medicine

## 2015-12-14 VITALS — BP 136/74 | HR 83 | Ht 69.0 in | Wt 218.0 lb

## 2015-12-14 DIAGNOSIS — L723 Sebaceous cyst: Secondary | ICD-10-CM

## 2015-12-14 NOTE — Progress Notes (Signed)
   Subjective:    Patient ID: Angelique BlonderNancy M Eckart, female    DOB: 1945-11-01, 70 y.o.   MRN: [id number]  HPI 70 year old female comes in to follow-up on infected sebaceous of the left axilla. She came in for an incision and drainage 7 days ago and it has done well. She says still draining a little. She was not placed on antibiotics. She is here to recheck the area   Review of Systems     Objective:   Physical Exam  Constitutional: She appears well-developed and well-nourished.  HENT:  Head: Normocephalic and atraumatic.  Skin:  Left axilla with approximately 2 and half to 3 cm sebaceous cyst. It is still draining a small amount on exam today. No surrounding erythema. Nontender.       Assessment & Plan:  Sebaceous cyst-it looks like it has calmed down enough for full excision. Will refer to dermatology because of depth and location. It's approximately 2 and half to 3 cm in size.

## 2015-12-20 ENCOUNTER — Telehealth: Payer: Self-pay

## 2015-12-20 ENCOUNTER — Telehealth: Payer: Self-pay | Admitting: *Deleted

## 2015-12-20 NOTE — Telephone Encounter (Signed)
Called pt back. She wanted information regarding a colonoscopy?  Asked that she return call with more detail information about what she needed.Loralee PacasBarkley, [name] [place]

## 2015-12-20 NOTE — Telephone Encounter (Signed)
Toniann FailWendy a RN with [facility] called and states she had a home visit with Lisa SineNancy. While at the visit she heard a murmur.

## 2015-12-21 NOTE — Telephone Encounter (Signed)
If she having any other symptoms such as shortness of breath dizziness lightheadedness or fever. If not then we can certainly schedule follow-up in a few weeks and take a listen to it. If she is symptomatic them please let me know.

## 2015-12-22 NOTE — Telephone Encounter (Signed)
Spoke with Pt, she is asymptomatic. Appt scheduled for a little over a week from now. No further questions/concerns.

## 2015-12-26 ENCOUNTER — Other Ambulatory Visit: Payer: Self-pay | Admitting: Family Medicine

## 2015-12-26 DIAGNOSIS — I10 Essential (primary) hypertension: Secondary | ICD-10-CM

## 2016-01-02 ENCOUNTER — Ambulatory Visit (INDEPENDENT_AMBULATORY_CARE_PROVIDER_SITE_OTHER): Payer: Medicare Other | Admitting: Family Medicine

## 2016-01-02 ENCOUNTER — Encounter: Payer: Self-pay | Admitting: Family Medicine

## 2016-01-02 VITALS — BP 138/59 | HR 78 | Wt 218.0 lb

## 2016-01-02 DIAGNOSIS — R011 Cardiac murmur, unspecified: Secondary | ICD-10-CM | POA: Diagnosis not present

## 2016-01-02 NOTE — Progress Notes (Addendum)
Subjective:    CC: Heart murmur  HPI:  70 year old female comes in today to discuss a possible heart murmur. A [employer] healthcare nurse heard a murmuron exam and recommended that she follow with her primary care provider. Do not have an echo cardiac exam on file for her.  Past medical history, Surgical history, Family history not pertinant except as noted below, Social history, Allergies, and medications have been entered into the medical record, reviewed, and corrections made.   Review of Systems: No fevers, chills, night sweats, weight loss, chest pain, or shortness of breath.   Objective:    General: Well Developed, well nourished, and in no acute distress.  Neuro: Alert and oriented x3, extra-ocular muscles intact, sensation grossly intact.  HEENT: Normocephalic, atraumatic  Skin: Warm and dry, no rashes. Cardiac: Regular rate and rhythm, 2/6 SEM murmurs rubs or gallops, no lower extremity edema.  Respiratory: Clear to auscultation bilaterally. Not using accessory muscles, speaking in full sentences.   Impression and Recommendations:    Murmur, 2/6 systolic ejection murmur. Likely benign. Because of new diagnosis we did go ahead and do an EKG today. She is completely asymptomatic. EKG shows rate of 75 bpm, normal sinus rhythm with normal axis and no acute ST-T wave changes. Also schedule for echocardiogram for baseline as well. No signs of heart failure etc.

## 2016-01-05 ENCOUNTER — Ambulatory Visit: Payer: Medicare Other | Admitting: Family Medicine

## 2016-04-23 ENCOUNTER — Ambulatory Visit (INDEPENDENT_AMBULATORY_CARE_PROVIDER_SITE_OTHER): Payer: Medicare Other | Admitting: Family Medicine

## 2016-04-23 ENCOUNTER — Encounter: Payer: Self-pay | Admitting: Family Medicine

## 2016-04-23 ENCOUNTER — Ambulatory Visit (INDEPENDENT_AMBULATORY_CARE_PROVIDER_SITE_OTHER): Payer: Medicare Other

## 2016-04-23 VITALS — BP 140/84 | HR 75 | Ht 69.0 in | Wt 218.0 lb

## 2016-04-23 DIAGNOSIS — M545 Low back pain, unspecified: Secondary | ICD-10-CM

## 2016-04-23 DIAGNOSIS — M5136 Other intervertebral disc degeneration, lumbar region: Secondary | ICD-10-CM

## 2016-04-23 DIAGNOSIS — G8929 Other chronic pain: Secondary | ICD-10-CM

## 2016-04-23 DIAGNOSIS — M5416 Radiculopathy, lumbar region: Secondary | ICD-10-CM

## 2016-04-23 DIAGNOSIS — I10 Essential (primary) hypertension: Secondary | ICD-10-CM | POA: Diagnosis not present

## 2016-04-23 DIAGNOSIS — M5116 Intervertebral disc disorders with radiculopathy, lumbar region: Secondary | ICD-10-CM

## 2016-04-23 DIAGNOSIS — N183 Chronic kidney disease, stage 3 (moderate): Secondary | ICD-10-CM | POA: Diagnosis not present

## 2016-04-23 DIAGNOSIS — R7301 Impaired fasting glucose: Secondary | ICD-10-CM

## 2016-04-23 DIAGNOSIS — N1832 Chronic kidney disease, stage 3b: Secondary | ICD-10-CM

## 2016-04-23 LAB — BASIC METABOLIC PANEL WITH GFR
BUN: 19 mg/dL (ref 7–25)
CO2: 28 mmol/L (ref 20–31)
Calcium: 9.5 mg/dL (ref 8.6–10.4)
Chloride: 103 mmol/L (ref 98–110)
Creat: 1.02 mg/dL — ABNORMAL HIGH (ref 0.60–0.93)
GFR, EST AFRICAN AMERICAN: 64 mL/min (ref >=60)
GFR, EST NON AFRICAN AMERICAN: 56 mL/min — AB (ref >=60)
Glucose, Bld: 101 mg/dL — ABNORMAL HIGH (ref 65–99)
POTASSIUM: 4.2 mmol/L (ref 3.5–5.3)
SODIUM: 140 mmol/L (ref 135–146)

## 2016-04-23 LAB — POCT GLYCOSYLATED HEMOGLOBIN (HGB A1C): HEMOGLOBIN A1C: 5.8

## 2016-04-23 MED ORDER — AMLODIPINE BESYLATE 5 MG PO TABS: 5.0000 mg | ORAL_TABLET | Freq: Every day | ORAL | 1 refills | Status: DC

## 2016-04-23 MED ORDER — LISINOPRIL-HYDROCHLOROTHIAZIDE 20-12.5 MG PO TABS: 1.0000 | ORAL_TABLET | Freq: Every day | ORAL | 1 refills | Status: DC

## 2016-04-23 NOTE — Progress Notes (Addendum)
Subjective:    CC: HTN, IFG  HPI: Hypertension- Pt denies chest pain, SOB, dizziness, or heart palpitations.  Taking meds as directed w/o problems.  Denies medication side effects.    Impaired fasting glucose-no increased thirst or urination. No symptoms consistent with hypoglycemia.  She is also still complaining of leg and low back pain. She says most of her pain and discomfort is in the thigh area. She says is extremely difficult to get up from a sitting position. She says she feels like this is really abnormal especially since she does exercise regularly. She said been taking some over-the-counter herbal supplements for pain. Her pain is better at rest. She is able to walk without having to stop and rest though. She denies any numbness or tingling in her feet or lower legs. She says in fact years ago probably more than 10 years ago she was having back problems and was told that she would need surgery at that time she thinks it was for a disc issue. She actually and it gradually getting better and not having the surgery.  Past medical history, Surgical history, Family history not pertinant except as noted below, Social history, Allergies, and medications have been entered into the medical record, reviewed, and corrections made.   Review of Systems: No fevers, chills, night sweats, weight loss, chest pain, or shortness of breath.   Objective:    General: Well Developed, well nourished, and in no acute distress.  Neuro: Alert and oriented x3, extra-ocular muscles intact, sensation grossly intact.  HEENT: Normocephalic, atraumatic  Skin: Warm and dry, no rashes. Cardiac: Regular rate and rhythm, no murmurs rubs or gallops, no lower extremity edema.  Respiratory: Clear to auscultation bilaterally. Not using accessory muscles, speaking in full sentences.   Impression and Recommendations:   HTN - Blood pressure uncontrolled today but she actually forgot to take her medication. I'll have her  come back in about 2 weeks for nurse blood pressure check. Hopefully will be back down to normal.  IFG - 11 A1c of 5.8 today. Continue current regimen continue to work on healthy diet and regular exercise.  Low back pain/upper thigh pain and weakness-start with plain film x-ray. Consider MRI for further evaluation.  CKD 3 - due to recheck renal function.

## 2016-04-30 ENCOUNTER — Ambulatory Visit (INDEPENDENT_AMBULATORY_CARE_PROVIDER_SITE_OTHER): Payer: Medicare Other

## 2016-04-30 DIAGNOSIS — G8929 Other chronic pain: Secondary | ICD-10-CM

## 2016-04-30 DIAGNOSIS — M5115 Intervertebral disc disorders with radiculopathy, thoracolumbar region: Secondary | ICD-10-CM | POA: Diagnosis not present

## 2016-04-30 DIAGNOSIS — M5416 Radiculopathy, lumbar region: Secondary | ICD-10-CM

## 2016-04-30 DIAGNOSIS — M4316 Spondylolisthesis, lumbar region: Secondary | ICD-10-CM | POA: Diagnosis not present

## 2016-04-30 DIAGNOSIS — M5136 Other intervertebral disc degeneration, lumbar region: Secondary | ICD-10-CM

## 2016-04-30 DIAGNOSIS — M4807 Spinal stenosis, lumbosacral region: Secondary | ICD-10-CM

## 2016-04-30 DIAGNOSIS — M545 Low back pain: Secondary | ICD-10-CM | POA: Diagnosis not present

## 2016-04-30 DIAGNOSIS — M479 Spondylosis, unspecified: Secondary | ICD-10-CM

## 2016-04-30 DIAGNOSIS — M5116 Intervertebral disc disorders with radiculopathy, lumbar region: Secondary | ICD-10-CM | POA: Diagnosis not present

## 2016-05-04 ENCOUNTER — Encounter: Payer: Self-pay | Admitting: Family Medicine

## 2016-05-04 ENCOUNTER — Ambulatory Visit (INDEPENDENT_AMBULATORY_CARE_PROVIDER_SITE_OTHER): Payer: Medicare Other | Admitting: Family Medicine

## 2016-05-04 VITALS — BP 136/78 | HR 94 | Ht 69.0 in | Wt 218.0 lb

## 2016-05-04 DIAGNOSIS — M48062 Spinal stenosis, lumbar region with neurogenic claudication: Secondary | ICD-10-CM

## 2016-05-04 DIAGNOSIS — M5136 Other intervertebral disc degeneration, lumbar region: Secondary | ICD-10-CM | POA: Diagnosis not present

## 2016-05-04 NOTE — Progress Notes (Signed)
   Subjective:    Patient ID: [name], female    DOB: [birth year], 71 y.o.   MRN: [id number]  HPI 71 yo female here to go over her CT results.  She had c/o of feeling weak in her thighs and legs and having difficulty standing when she has been sitting for awhile.  She had also c/o of some intermittant back pain. She does exercise regularly.  No numbness or tingling in her legs.  She does have frequent thigh pain. Taking OTC meds for pain.   Her sister is here with her today to go over her imaging results.    Review of Systems     Objective:   Physical Exam  Constitutional: She is oriented to person, place, and time. She appears well-developed and well-nourished.  HENT:  Head: Normocephalic and atraumatic.  Eyes: Conjunctivae and EOM are normal.  Cardiovascular: Normal rate.   Pulmonary/Chest: Effort normal.  Neurological: She is alert and oriented to person, place, and time.  Skin: Skin is dry. No pallor.  Psychiatric: She has a normal mood and affect. Her behavior is normal.  Vitals reviewed.         Assessment & Plan:  Spinal stenosis of lumbar region. 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  We reviewed her results and discussed treatment options.  Will refer to neurosurgery. Discussed tx may initially be therapy or there tx like injections.    Time spent 20 min, > 50% spent counseling about her spinal stenosis and DDD and discussing tx options.

## 2016-05-05 DIAGNOSIS — M4800 Spinal stenosis, site unspecified: Secondary | ICD-10-CM | POA: Insufficient documentation

## 2016-05-05 DIAGNOSIS — M48062 Spinal stenosis, lumbar region with neurogenic claudication: Secondary | ICD-10-CM | POA: Insufficient documentation

## 2016-05-07 ENCOUNTER — Ambulatory Visit: Payer: Medicare Other

## 2016-05-15 DIAGNOSIS — M4726 Other spondylosis with radiculopathy, lumbar region: Secondary | ICD-10-CM | POA: Diagnosis not present

## 2016-05-15 DIAGNOSIS — M48062 Spinal stenosis, lumbar region with neurogenic claudication: Secondary | ICD-10-CM | POA: Diagnosis not present

## 2016-05-15 DIAGNOSIS — I10 Essential (primary) hypertension: Secondary | ICD-10-CM | POA: Diagnosis not present

## 2016-05-17 ENCOUNTER — Other Ambulatory Visit: Payer: Self-pay | Admitting: Family Medicine

## 2016-05-17 DIAGNOSIS — Z1231 Encounter for screening mammogram for malignant neoplasm of breast: Secondary | ICD-10-CM

## 2016-05-31 ENCOUNTER — Telehealth: Payer: Self-pay | Admitting: Family Medicine

## 2016-05-31 NOTE — Telephone Encounter (Signed)
Pt called clinic to see what PCP thinks about the laser spine institute. Pt reports there are no locations close but she is willing to travel to [place], [place], etc. She will only do this if PCP thinks it would be beneficial for her "issues."

## 2016-06-01 NOTE — Telephone Encounter (Signed)
Pt advised,verbalized understanding. 

## 2016-06-01 NOTE — Telephone Encounter (Signed)
Call pt: I think for what she has going on with her back I would stick with standard surgery.  You still have an incision with the laser procedure and it really only usually for a few conditions.

## 2016-06-11 ENCOUNTER — Encounter: Payer: Self-pay | Admitting: Osteopathic Medicine

## 2016-06-11 ENCOUNTER — Ambulatory Visit (INDEPENDENT_AMBULATORY_CARE_PROVIDER_SITE_OTHER): Payer: Medicare Other | Admitting: Osteopathic Medicine

## 2016-06-11 ENCOUNTER — Encounter: Payer: Self-pay | Admitting: *Deleted

## 2016-06-11 VITALS — BP 148/80 | HR 73 | Ht 69.0 in | Wt 221.0 lb

## 2016-06-11 DIAGNOSIS — M26621 Arthralgia of right temporomandibular joint: Secondary | ICD-10-CM

## 2016-06-11 DIAGNOSIS — I10 Essential (primary) hypertension: Secondary | ICD-10-CM | POA: Diagnosis not present

## 2016-06-11 NOTE — Patient Instructions (Addendum)
Plan: 1. OK to continue Aleve as needed 2. If worse - such as pain with chewing or jaw movement, fever/skin pain, or other concerns, please seek medical care asap!   Temporomandibular Joint Syndrome Temporomandibular joint (TMJ) syndrome is a condition that affects the joints between your jaw and your skull. The TMJs are located near your ears and allow your jaw to open and close. These joints and the nearby muscles are involved in all movements of the jaw. People with TMJ syndrome have pain in the area of these joints and muscles. Chewing, biting, or other movements of the jaw can be difficult or painful. TMJ syndrome can be caused by various things. In many cases, the condition is mild and goes away within a few weeks. For some people, the condition can become a long-term problem. What are the causes? Possible causes of TMJ syndrome include:  Grinding your teeth or clenching your jaw. Some people do this when they are under stress.  Arthritis.  Injury to the jaw.  Head or neck injury.  Teeth or dentures that are not aligned well. In some cases, the cause of TMJ syndrome may not be known. What are the signs or symptoms? The most common symptom is an aching pain on the side of the head in the area of the TMJ. Other symptoms may include:  Pain when moving your jaw, such as when chewing or biting.  Being unable to open your jaw all the way.  Making a clicking sound when you open your mouth.  Headache.  Earache.  Neck or shoulder pain. How is this diagnosed? Diagnosis can usually be made based on your symptoms, your medical history, and a physical exam. Your health care provider may check the range of motion of your jaw. Imaging tests, such as X-rays or an MRI, are sometimes done. You may need to see your dentist to determine if your teeth and jaw are lined up correctly. How is this treated? TMJ syndrome often goes away on its own. If treatment is needed, the options may  include:  Eating soft foods and applying ice or heat.  Medicines to relieve pain or inflammation.  Medicines to relax the muscles.  A splint, bite plate, or mouthpiece to prevent teeth grinding or jaw clenching.  Relaxation techniques or counseling to help reduce stress.  Transcutaneous electrical nerve stimulation (TENS). This helps to relieve pain by applying an electrical current through the skin.  Acupuncture. This is sometimes helpful to relieve pain.  Jaw surgery. This is rarely needed. Follow these instructions at home:  Take medicines only as directed by your health care provider.  Eat a soft diet if you are having trouble chewing.  Apply ice to the painful area.  Put ice in a plastic bag.  Place a towel between your skin and the bag.  Leave the ice on for 20 minutes, 2-3 times a day.  Apply a warm compress to the painful area as directed.  Massage your jaw area and perform any jaw stretching exercises as recommended by your health care provider.  If you were given a mouthpiece or bite plate, wear it as directed.  Avoid foods that require a lot of chewing. Do not chew gum.  Keep all follow-up visits as directed by your health care provider. This is important. Contact a health care provider if:  You are having trouble eating.  You have new or worsening symptoms. Get help right away if:  Your jaw locks open or closed. This  information is not intended to replace advice given to you by your health care provider. Make sure you discuss any questions you have with your health care provider. Document Released: 11/07/2000 Document Revised: 10/13/2015 Document Reviewed: 09/17/2013 Elsevier Interactive Patient Education  2017 [employer].

## 2016-06-11 NOTE — Progress Notes (Signed)
HPI: [name] is a 71 y.o. female  who presents to [facility] today, 06/11/16,  for chief complaint of:  Chief Complaint  Patient presents with  . Jaw Pain    RIGHT    Jaw pain  . Context: no injury or chewing strain lately . Location: right jaw . Quality: pain/swelling, "feels like lockjaw" tightness but no catching/popping . Severity:was worse yesterday and better today . Duration:<24 hours . Modifying factors: Aleve was helpful    Past medical history, surgical history, social history and family history reviewed.  Patient Active Problem List   Diagnosis Date Noted  . Spinal stenosis of lumbar region with neurogenic claudication 05/05/2016  . Chronic kidney disease (CKD) stage G3b/A1, moderately decreased glomerular filtration rate (GFR) between 30-44 mL/min/1.73 square meter and albuminuria creatinine ratio less than 30 mg/g 06/11/2014  . IFG (impaired fasting glucose) 06/11/2014  . Left shoulder pain 07/27/2013  . Osteoarthritis of right knee 07/27/2013  . Lumbar degenerative disc disease 04/07/2013  . Pelvic kidney 07/30/2012  . Hyperlipidemia 01/06/2008  . HYPERTENSION, BENIGN 01/06/2008    Current medication list and allergy/intolerance information reviewed.   Current Outpatient Prescriptions on File Prior to Visit  Medication Sig Dispense Refill  . AMBULATORY NON FORMULARY MEDICATION Medication Name: DSMO    . amLODipine (NORVASC) 5 MG tablet Take 1 tablet (5 mg total) by mouth daily. 90 tablet 1  . aspirin 81 MG tablet Take 81 mg by mouth daily.      . Calcium Carbonate-Vitamin D (CALCIUM 600+D) 600-200 MG-UNIT TABS Take by mouth.      Marland Kitchen lisinopril-hydrochlorothiazide (PRINZIDE,ZESTORETIC) 20-12.5 MG tablet Take 1 tablet by mouth daily. 90 tablet 1  . Multiple Vitamins-Minerals (MULTIVITAMIN PO) Take by mouth.    . Omega-3 Fatty Acids (FISH OIL) 1000 MG CPDR Take by mouth.      Marland Kitchen OVER THE COUNTER MEDICATION Curamin  Extra Strength PO    . OVER THE COUNTER MEDICATION Joint Astin PO    . OVER THE COUNTER MEDICATION Skeletal Strength PO    . Probiotic Product (PROBIOTIC DAILY PO) Take by mouth.     No current facility-administered medications on file prior to visit.    Allergies  Allergen Reactions  . Penicillins Rash    REACTION: rash      Review of Systems:  Constitutional: No recent illness  HEENT: No  headache, no vision change, no dental pain   Cardiac: No  chest pain, No  pressure  Respiratory:  No  shortness of breath. No  Cough  Skin: No  Rash/redness  Hem/Onc: No  abnormal lumps/bumps except ?enlarged LN R cervial  Neurologic: No  weakness, No  Dizziness   Exam:  BP (!) 148/80   Pulse 73   Ht  (1.753 m)   Wt 221 lb (100.2 kg)   BMI 32.64 kg/m   Constitutional: VS see above. General Appearance: alert, well-developed, well-nourished, NAD  Eyes: Normal lids and conjunctive, non-icteric sclera  Ears, Nose, Mouth, Throat: MMM, Normal external inspection ears/nares/mouth/lips/gums. Normal gums and fair dentition - no abscess or severe caries noted. Slight sweling appearance to R face vs L but no edema/erythema/tenderness  Neck: No masses, trachea midline. No lymphadenopathy anterior or posterior cervical    Respiratory: Normal respiratory effort  Musculoskeletal: Gait normal. Symmetric and independent movement of all extremities. TMJ on R and L examined w/ opening/closing mandible, clenching teeth - no catching/dislocation, no crepitus. Normal ROM without pain.   Skin:  warm, dry, intact. No erythema or tenderness  Psychiatric: Normal judgment/insight. Normal mood and affect. Oriented x3.       ASSESSMENT/PLAN: The encounter diagnosis was Arthralgia of right temporomandibular joint. 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 No s/s infection at this point, reassuring that symptoms are already much better than at onset of problem yesterday.  Precautions reviewed, info printed.       Follow-up plan: Return if symptoms worsen or fail to improve, and for BP follow-up with PCP.  Visit summary with medication list and pertinent instructions was printed for patient to review, alert [place] if any changes needed. All questions at time of visit were answered - patient instructed to contact office with any additional concerns. ER/RTC precautions were reviewed with the patient and understanding verbalized.

## 2016-06-13 ENCOUNTER — Telehealth: Payer: Self-pay

## 2016-06-13 NOTE — Telephone Encounter (Signed)
I think ok to give it a couple of weeks as long as continuing to get better.

## 2016-06-13 NOTE — Telephone Encounter (Signed)
Pt called, she saw Dr Lyn Hollingshead Monday.  She is still having right sided jaw pain, but she said it is getting better.  She wanted to know if she needs to come back in or just give it time.  I think from talking to her that she just wants reassurance that you that she is doing what she needs to do to get better.  Please advise.

## 2016-06-13 NOTE — Telephone Encounter (Signed)
Notified patient.

## 2016-07-03 ENCOUNTER — Ambulatory Visit
Admission: RE | Admit: 2016-07-03 | Discharge: 2016-07-03 | Disposition: A | Discharge status: Home / Self Care | Payer: Medicare Other | Source: Ambulatory Visit | Attending: Family Medicine | Admitting: Family Medicine

## 2016-07-03 ENCOUNTER — Encounter: Payer: Self-pay | Admitting: Women's Health

## 2016-07-03 ENCOUNTER — Ambulatory Visit (INDEPENDENT_AMBULATORY_CARE_PROVIDER_SITE_OTHER): Payer: Medicare Other | Admitting: Women's Health

## 2016-07-03 VITALS — BP 130/80 | Ht 69.0 in | Wt 220.0 lb

## 2016-07-03 DIAGNOSIS — E2839 Other primary ovarian failure: Secondary | ICD-10-CM

## 2016-07-03 DIAGNOSIS — Z1231 Encounter for screening mammogram for malignant neoplasm of breast: Secondary | ICD-10-CM

## 2016-07-03 DIAGNOSIS — Z01419 Encounter for gynecological examination (general) (routine) without abnormal findings: Secondary | ICD-10-CM | POA: Diagnosis not present

## 2016-07-03 NOTE — Patient Instructions (Signed)
DASH Eating Plan DASH stands for "Dietary Approaches to Stop Hypertension." The DASH eating plan is a healthy eating plan that has been shown to reduce high blood pressure (hypertension). It may also reduce your risk for type 2 diabetes, heart disease, and stroke. The DASH eating plan may also help with weight loss. What are tips for following this plan? General guidelines  Avoid eating more than 2,300 mg (milligrams) of salt (sodium) a day. If you have hypertension, you may need to reduce your sodium intake to 1,500 mg a day.  Limit alcohol intake to no more than 1 drink a day for nonpregnant women and 2 drinks a day for men. One drink equals 12 oz of beer, 5 oz of wine, or 1 oz of hard liquor.  Work with your health care provider to maintain a healthy body weight or to lose weight. Ask what an ideal weight is for you.  Get at least 30 minutes of exercise that causes your heart to beat faster (aerobic exercise) most days of the week. Activities may include walking, swimming, or biking.  Work with your health care provider or diet and nutrition specialist (dietitian) to adjust your eating plan to your individual calorie needs. Reading food labels  Check food labels for the amount of sodium per serving. Choose foods with less than 5 percent of the Daily Value of sodium. Generally, foods with less than 300 mg of sodium per serving fit into this eating plan.  To find whole grains, look for the word "whole" as the first word in the ingredient list. Shopping  Buy products labeled as "low-sodium" or "no salt added."  Buy fresh foods. Avoid canned foods and premade or frozen meals. Cooking  Avoid adding salt when cooking. Use salt-free seasonings or herbs instead of table salt or sea salt. Check with your health care provider or pharmacist before using salt substitutes.  Do not fry foods. Cook foods using healthy methods such as baking, boiling, grilling, and broiling instead.  Cook with  heart-healthy oils, such as olive, canola, soybean, or sunflower oil. Meal planning   Eat a balanced diet that includes: ? 5 or more servings of fruits and vegetables each day. At each meal, try to fill half of your plate with fruits and vegetables. ? Up to 6-8 servings of whole grains each day. ? Less than 6 oz of lean meat, poultry, or fish each day. A 3-oz serving of meat is about the same size as a deck of cards. One egg equals 1 oz. ? 2 servings of low-fat dairy each day. ? A serving of nuts, seeds, or beans 5 times each week. ? Heart-healthy fats. Healthy fats called Omega-3 fatty acids are found in foods such as flaxseeds and coldwater fish, like sardines, salmon, and mackerel.  Limit how much you eat of the following: ? Canned or prepackaged foods. ? Food that is high in trans fat, such as fried foods. ? Food that is high in saturated fat, such as fatty meat. ? Sweets, desserts, sugary drinks, and other foods with added sugar. ? Full-fat dairy products.  Do not salt foods before eating.  Try to eat at least 2 vegetarian meals each week.  Eat more home-cooked food and less restaurant, buffet, and fast food.  When eating at a restaurant, ask that your food be prepared with less salt or no salt, if possible. What foods are recommended? The items listed may not be a complete list. Talk with your dietitian about what   with your dietitian about what dietary choices are best for you. Grains  Whole-grain or whole-wheat bread. Whole-grain or whole-wheat pasta. Brown rice. [place]row. Bulgur. Whole-grain and low-sodium cereals. Pita bread. Low-fat, low-sodium crackers. Whole-wheat flour tortillas. Vegetables  Fresh or frozen vegetables (raw, steamed, roasted, or grilled). Low-sodium or reduced-sodium tomato and vegetable juice. Low-sodium or reduced-sodium tomato sauce and tomato paste. Low-sodium or reduced-sodium canned vegetables. Fruits  All fresh, dried, or frozen fruit. Canned fruit in natural juice  (without added sugar). Meat and other protein foods  Skinless chicken or [place]. Ground chicken or [place]. Pork with fat trimmed off. Fish and seafood. Egg whites. Dried beans, peas, or lentils. Unsalted nuts, nut butters, and seeds. Unsalted canned beans. Lean cuts of beef with fat trimmed off. Low-sodium, lean deli meat. Dairy  Low-fat (1%) or fat-free (skim) milk. Fat-free, low-fat, or reduced-fat cheeses. Nonfat, low-sodium ricotta or cottage cheese. Low-fat or nonfat yogurt. Low-fat, low-sodium cheese. Fats and oils  Soft margarine without trans fats. Vegetable oil. Low-fat, reduced-fat, or light mayonnaise and salad dressings (reduced-sodium). Canola, safflower, olive, soybean, and sunflower oils. Avocado. Seasoning and other foods  Herbs. Spices. Seasoning mixes without salt. Unsalted popcorn and pretzels. Fat-free sweets. What foods are not recommended? The items listed may not be a complete list. Talk with your dietitian about what dietary choices are best for you. Grains  Baked goods made with fat, such as croissants, muffins, or some breads. Dry pasta or rice meal packs. Vegetables  Creamed or fried vegetables. Vegetables in a cheese sauce. Regular canned vegetables (not low-sodium or reduced-sodium). Regular canned tomato sauce and paste (not low-sodium or reduced-sodium). Regular tomato and vegetable juice (not low-sodium or reduced-sodium). Angie Fava. Olives. Fruits  Canned fruit in a light or heavy syrup. Fried fruit. Fruit in cream or butter sauce. Meat and other protein foods  Fatty cuts of meat. Ribs. Fried meat. Berniece Salines. Sausage. Bologna and other processed lunch meats. Salami. Fatback. Hotdogs. Bratwurst. Salted nuts and seeds. Canned beans with added salt. Canned or smoked fish. Whole eggs or egg yolks. Chicken or [place] with skin. Dairy  Whole or 2% milk, cream, and half-and-half. Whole or full-fat cream cheese. Whole-fat or sweetened yogurt. Full-fat cheese. Nondairy creamers.  Whipped toppings. Processed cheese and cheese spreads. Fats and oils  Butter. Stick margarine. Lard. Shortening. Ghee. Bacon fat. Tropical oils, such as coconut, palm kernel, or palm oil. Seasoning and other foods  Salted popcorn and pretzels. Onion salt, garlic salt, seasoned salt, table salt, and sea salt. Worcestershire sauce. Tartar sauce. Barbecue sauce. Teriyaki sauce. Soy sauce, including reduced-sodium. Steak sauce. Canned and packaged gravies. Fish sauce. Oyster sauce. Cocktail sauce. Horseradish that you find on the shelf. Ketchup. Mustard. Meat flavorings and tenderizers. Bouillon cubes. Hot sauce and Tabasco sauce. Premade or packaged marinades. Premade or packaged taco seasonings. Relishes. Regular salad dressings. Where to find more information:  National Heart, Lung, and [place]: https://wilson-eaton.com/  American Heart Association: www.heart.org Summary  The DASH eating plan is a healthy eating plan that has been shown to reduce high blood pressure (hypertension). It may also reduce your risk for type 2 diabetes, heart disease, and stroke.  With the DASH eating plan, you should limit salt (sodium) intake to 2,300 mg a day. If you have hypertension, you may need to reduce your sodium intake to 1,500 mg a day.  When on the DASH eating plan, aim to eat more fresh fruits and vegetables, whole grains, lean proteins, low-fat dairy, and heart-healthy fats.  Work  with your health care provider or diet and nutrition specialist (dietitian) to adjust your eating plan to your individual calorie needs. This information is not intended to replace advice given to you by your health care provider. Make sure you discuss any questions you have with your health care provider. Document Released: 02/01/2011 Document Revised: 02/06/2016 Document Reviewed: 02/06/2016 Elsevier Interactive Patient Education  2017 [place] Maintenance for Postmenopausal Women Menopause is a normal process  in which your reproductive ability comes to an end. This process happens gradually over a span of months to years, usually between the ages of 39 and 64. Menopause is complete when you have missed 12 consecutive menstrual periods. It is important to talk with your health care provider about some of the most common conditions that affect postmenopausal women, such as heart disease, cancer, and bone loss (osteoporosis). Adopting a healthy lifestyle and getting preventive care can help to promote your health and wellness. Those actions can also lower your chances of developing some of these common conditions. What should I know about menopause? During menopause, you may experience a number of symptoms, such as:  Moderate-to-severe hot flashes.  Night sweats.  Decrease in sex drive.  Mood swings.  Headaches.  Tiredness.  Irritability.  Memory problems.  Insomnia. Choosing to treat or not to treat menopausal changes is an individual decision that you make with your health care provider. What should I know about hormone replacement therapy and supplements? Hormone therapy products are effective for treating symptoms that are associated with menopause, such as hot flashes and night sweats. Hormone replacement carries certain risks, especially as you become older. If you are thinking about using estrogen or estrogen with progestin treatments, discuss the benefits and risks with your health care provider. What should I know about heart disease and stroke? Heart disease, heart attack, and stroke become more likely as you age. This may be due, in part, to the hormonal changes that your body experiences during menopause. These can affect how your body processes dietary fats, triglycerides, and cholesterol. Heart attack and stroke are both medical emergencies. There are many things that you can do to help prevent heart disease and stroke:  Have your blood pressure checked at least every 1-2 years.  High blood pressure causes heart disease and increases the risk of stroke.  If you are 92-53 years old, ask your health care provider if you should take aspirin to prevent a heart attack or a stroke.  Do not use any tobacco products, including cigarettes, chewing tobacco, or electronic cigarettes. If you need help quitting, ask your health care provider.  It is important to eat a healthy diet and maintain a healthy weight.  Be sure to include plenty of vegetables, fruits, low-fat dairy products, and lean protein.  Avoid eating foods that are high in solid fats, added sugars, or salt (sodium).  Get regular exercise. This is one of the most important things that you can do for your health.  Try to exercise for at least 150 minutes each week. The type of exercise that you do should increase your heart rate and make you sweat. This is known as moderate-intensity exercise.  Try to do strengthening exercises at least twice each week. Do these in addition to the moderate-intensity exercise.  Know your numbers.Ask your health care provider to check your cholesterol and your blood glucose. Continue to have your blood tested as directed by your health care provider. What should I know about cancer screening? There  are several types of cancer. Take the following steps to reduce your risk and to catch any cancer development as early as possible. Breast Cancer  Practice breast self-awareness.  This means understanding how your breasts normally appear and feel.  It also means doing regular breast self-exams. Let your health care provider know about any changes, no matter how small.  If you are 75 or older, have a clinician do a breast exam (clinical breast exam or CBE) every year. Depending on your age, family history, and medical history, it may be recommended that you also have a yearly breast X-ray (mammogram).  If you have a family history of breast cancer, talk with your health care provider  about genetic screening.  If you are at high risk for breast cancer, talk with your health care provider about having an MRI and a mammogram every year.  Breast cancer (BRCA) gene test is recommended for women who have family members with BRCA-related cancers. Results of the assessment will determine the need for genetic counseling and BRCA1 and for BRCA2 testing. BRCA-related cancers include these types:  Breast. This occurs in males or females.  Ovarian.  Tubal. This may also be called fallopian tube cancer.  Cancer of the abdominal or pelvic lining (peritoneal cancer).  Prostate.  Pancreatic. Cervical, Uterine, and Ovarian Cancer  Your health care provider may recommend that you be screened regularly for cancer of the pelvic organs. These include your ovaries, uterus, and vagina. This screening involves a pelvic exam, which includes checking for microscopic changes to the surface of your cervix (Pap test).  For women ages 21-65, health care providers may recommend a pelvic exam and a Pap test every three years. For women ages 27-65, they may recommend the Pap test and pelvic exam, combined with testing for human papilloma virus (HPV), every five years. Some types of HPV increase your risk of cervical cancer. Testing for HPV may also be done on women of any age who have unclear Pap test results.  Other health care providers may not recommend any screening for nonpregnant women who are considered low risk for pelvic cancer and have no symptoms. Ask your health care provider if a screening pelvic exam is right for you.  If you have had past treatment for cervical cancer or a condition that could lead to cancer, you need Pap tests and screening for cancer for at least 20 years after your treatment. If Pap tests have been discontinued for you, your risk factors (such as having a new sexual partner) need to be reassessed to determine if you should start having screenings again. Some women have  medical problems that increase the chance of getting cervical cancer. In these cases, your health care provider may recommend that you have screening and Pap tests more often.  If you have a family history of uterine cancer or ovarian cancer, talk with your health care provider about genetic screening.  If you have vaginal bleeding after reaching menopause, tell your health care provider.  There are currently no reliable tests available to screen for ovarian cancer. Lung Cancer  Lung cancer screening is recommended for adults 4-37 years old who are at high risk for lung cancer because of a history of smoking. A yearly low-dose CT scan of the lungs is recommended if you:  Currently smoke.  Have a history of at least 30 pack-years of smoking and you currently smoke or have quit within the past 15 years. A pack-year is smoking an average  of one pack of cigarettes per day for one year. Yearly screening should:  Continue until it has been 15 years since you quit.  Stop if you develop a health problem that would prevent you from having lung cancer treatment. Colorectal Cancer  This type of cancer can be detected and can often be prevented.  Routine colorectal cancer screening usually begins at age 28 and continues through age 71.  If you have risk factors for colon cancer, your health care provider may recommend that you be screened at an earlier age.  If you have a family history of colorectal cancer, talk with your health care provider about genetic screening.  Your health care provider may also recommend using home test kits to check for hidden blood in your stool.  A small camera at the end of a tube can be used to examine your colon directly (sigmoidoscopy or colonoscopy). This is done to check for the earliest forms of colorectal cancer.  Direct examination of the colon should be repeated every 5-10 years until age 49. However, if early forms of precancerous polyps or small growths  are found or if you have a family history or genetic risk for colorectal cancer, you may need to be screened more often. Skin Cancer  Check your skin from head to toe regularly.  Monitor any moles. Be sure to tell your health care provider:  About any new moles or changes in moles, especially if there is a change in a mole's shape or color.  If you have a mole that is larger than the size of a pencil eraser.  If any of your family members has a history of skin cancer, especially at a  age, talk with your health care provider about genetic screening.  Always use sunscreen. Apply sunscreen liberally and repeatedly throughout the day.  Whenever you are outside, protect yourself by wearing long sleeves, pants, a wide-brimmed hat, and sunglasses. What should I know about osteoporosis? Osteoporosis is a condition in which bone destruction happens more quickly than new bone creation. After menopause, you may be at an increased risk for osteoporosis. To help prevent osteoporosis or the bone fractures that can happen because of osteoporosis, the following is recommended:  If you are 23-15 years old, get at least 1,000 mg of calcium and at least 600 mg of vitamin D per day.  If you are older than age 71 but er than age 55, get at least 1,200 mg of calcium and at least 600 mg of vitamin D per day.  If you are older than age 48, get at least 1,200 mg of calcium and at least 800 mg of vitamin D per day. Smoking and excessive alcohol intake increase the risk of osteoporosis. Eat foods that are rich in calcium and vitamin D, and do weight-bearing exercises several times each week as directed by your health care provider. What should I know about how menopause affects my mental health? Depression may occur at any age, but it is more common as you become older. Common symptoms of depression include:  Low or sad mood.  Changes in sleep patterns.  Changes in appetite or eating  patterns.  Feeling an overall lack of motivation or enjoyment of activities that you previously enjoyed.  Frequent crying spells. Talk with your health care provider if you think that you are experiencing depression. What should I know about immunizations? It is important that you get and maintain your immunizations. These include:  Tetanus, diphtheria, and pertussis (  Tdap) booster vaccine.  Influenza every year before the flu season begins.  Pneumonia vaccine.  Shingles vaccine. Your health care provider may also recommend other immunizations. This information is not intended to replace advice given to you by your health care provider. Make sure you discuss any questions you have with your health care provider. Document Released: 04/06/2005 Document Revised: 09/02/2015 Document Reviewed: 11/16/2014 Elsevier Interactive Patient Education  2017 [employer].

## 2016-07-03 NOTE — Progress Notes (Signed)
[name] October 04, 1945 [id number]    History:    Presents for breast and pelvic exam. [name] TAH for fibroids on no HRT.  Normal mammogram history. Primary care manages hypertension and hypercholesterolemia. Vaccines current. Diagnosed with lumbar stenosis, no current pain , weakness in her legs. 2013 +3.8 at the hip, +1.7 at spine. 2009 negative colonoscopy.  Not sexually active/husbands health.  Past medical history, past surgical history, family history and social history were all reviewed and documented in the EPIC chart. Retired Psychologist, occupationalbanker. Parents, sister, brother and numerous family members hypertension. 71 year old niece recently died of breast cancer.  ROS:  A ROS was performed and pertinent positives and negatives are included.  Exam:  Vitals:   07/03/16 1044  BP: 130/80  Weight: 220 lb (99.8 kg)  Height: 5\' 9"  (1.753 m)   Body mass index is 32.49 kg/m.   General appearance:  Normal Thyroid:  Symmetrical, normal in size, without palpable masses or nodularity. Respiratory  Auscultation:  Clear without wheezing or rhonchi Cardiovascular  Auscultation:  Regular rate, without rubs, murmurs or gallops  Edema/varicosities:  Not grossly evident Abdominal  Soft,nontender, without masses, guarding or rebound.  Liver/spleen:  No organomegaly noted  Hernia:  None appreciated  Skin  Inspection:  Grossly normal   Breasts: Examined lying and sitting.     Right: Without masses, retractions, discharge or axillary adenopathy.     Left: Without masses, retractions, discharge or axillary adenopathy. Gentitourinary   Inguinal/mons:  Normal without inguinal adenopathy  External genitalia:  Normal  BUS/Urethra/Skene's glands:  Normal  Vagina:  atrophy  Cervix:  absent  Uterus:  absent  Adnexa/parametria:     Rt: Without masses or tenderness.   Lt: Without masses or tenderness.  Anus and perineum: Normal  Digital rectal exam: Normal sphincter tone without palpated masses or  tenderness  Assessment/Plan:  71 y.o. MBF G0 for breast and pelvic exam with no complaints.  TAH for fibroids on no HRT Hypertension/hypercholesterolemia-primary care manages labs and meds Obesity  Plan: SBE's, continue annual 3-D screening mammogram, calcium rich diet, vitamin D 1000 daily encouraged. Home safety, fall prevention and importance of weight bearing exercise reviewed. Encouraged to increase exercise, decrease calories, Weight Watchers encouraged for weight loss. Dash diet reviewed. Dexa schedule ,      Harrington Challengerancy J Young [facility] Surgery Center IncWHNP, 11:25 AM 07/03/2016

## 2016-07-11 ENCOUNTER — Encounter: Payer: Self-pay | Admitting: Gynecology

## 2016-10-22 ENCOUNTER — Ambulatory Visit (INDEPENDENT_AMBULATORY_CARE_PROVIDER_SITE_OTHER): Payer: Medicare Other | Admitting: Family Medicine

## 2016-10-22 ENCOUNTER — Ambulatory Visit: Payer: Medicare Other | Admitting: Osteopathic Medicine

## 2016-10-22 ENCOUNTER — Encounter: Payer: Self-pay | Admitting: Family Medicine

## 2016-10-22 VITALS — BP 132/80 | HR 61 | Wt 219.0 lb

## 2016-10-22 DIAGNOSIS — Z6832 Body mass index (BMI) 32.0-32.9, adult: Secondary | ICD-10-CM | POA: Diagnosis not present

## 2016-10-22 DIAGNOSIS — R7301 Impaired fasting glucose: Secondary | ICD-10-CM

## 2016-10-22 DIAGNOSIS — I10 Essential (primary) hypertension: Secondary | ICD-10-CM | POA: Diagnosis not present

## 2016-10-22 DIAGNOSIS — Z23 Encounter for immunization: Secondary | ICD-10-CM | POA: Diagnosis not present

## 2016-10-22 DIAGNOSIS — M48062 Spinal stenosis, lumbar region with neurogenic claudication: Secondary | ICD-10-CM | POA: Diagnosis not present

## 2016-10-22 DIAGNOSIS — Z1159 Encounter for screening for other viral diseases: Secondary | ICD-10-CM | POA: Diagnosis not present

## 2016-10-22 LAB — POCT GLYCOSYLATED HEMOGLOBIN (HGB A1C): Hemoglobin A1C: 5.9

## 2016-10-22 NOTE — Progress Notes (Signed)
Subjective:    CC: HTN, IFG  HPI:  Hypertension- Pt denies chest pain, SOB, dizziness, or heart palpitations.  Taking meds as directed w/o problems.  Denies medication side effects.    Impaired fasting glucose-no increased thirst or urination. No symptoms consistent with hypoglycemia.Marland Kitchen  BMI 32-she admits she still struggling with portion control.but still feels like overall she is eating healthy. No regular exercise routine at this time.she has discussed with her sister setting up an accountability plan between the 2 of them to set some weight loss goals.   CKD 3 - stable. No recent changes.    Lab Results  Component Value Date   CREATININE 1.02 (H) 04/23/2016   Spinal stenosis-doing well overall. She has still a lot of stiffness getting up and bending but has not had a lot of pain  Past medical history, Surgical history, Family history not pertinant except as noted below, Social history, Allergies, and medications have been entered into the medical record, reviewed, and corrections made.   Review of Systems: No fevers, chills, night sweats, weight loss, chest pain, or shortness of breath.   Objective:    General: Well Developed, well nourished, and in no acute distress.  Neuro: Alert and oriented x3, extra-ocular muscles intact, sensation grossly intact.  HEENT: Normocephalic, atraumatic  Skin: Warm and dry, no rashes. Cardiac: Regular rate and rhythm, no murmurs rubs or gallops, no lower extremity edema.  Respiratory: Clear to auscultation bilaterally. Not using accessory muscles, speaking in full sentences.   Impression and Recommendations:    HTN - Well controlled. Continue current regimen. Follow up in  6 months. Due for CMP, lipid.  IFG - Well controlled. Continue current regimen. Follow up in  6 months.   CKD 3 - due to recheck.    BMI 32-discussed strategies to work on portion control, setting goals for exercise. She would like to get 280 pounds. I think that's a  fantastic goal.  Spinal stenosis-doing well with very little pain.  Discussed need for Hep C screening. Patient agreed.  Flu vaccine given today.

## 2016-10-30 DIAGNOSIS — I10 Essential (primary) hypertension: Secondary | ICD-10-CM | POA: Diagnosis not present

## 2016-10-30 DIAGNOSIS — Z1159 Encounter for screening for other viral diseases: Secondary | ICD-10-CM | POA: Diagnosis not present

## 2016-10-31 LAB — COMPLETE METABOLIC PANEL WITH GFR
ALK PHOS: 94 U/L (ref 33–130)
ALT: 19 U/L (ref 6–29)
AST: 21 U/L (ref 10–35)
Albumin: 4.4 g/dL (ref 3.6–5.1)
BUN: 18 mg/dL (ref 7–25)
CALCIUM: 9.5 mg/dL (ref 8.6–10.4)
CO2: 27 mmol/L (ref 20–32)
Chloride: 101 mmol/L (ref 98–110)
Creat: 0.84 mg/dL (ref 0.60–0.93)
GFR, EST AFRICAN AMERICAN: 81 mL/min (ref >=60)
GFR, EST NON AFRICAN AMERICAN: 70 mL/min (ref >=60)
Glucose, Bld: 112 mg/dL — ABNORMAL HIGH (ref 65–99)
POTASSIUM: 4.3 mmol/L (ref 3.5–5.3)
Sodium: 140 mmol/L (ref 135–146)
Total Bilirubin: 0.5 mg/dL (ref 0.2–1.2)
Total Protein: 7.1 g/dL (ref 6.1–8.1)

## 2016-10-31 LAB — LIPID PANEL W/REFLEX DIRECT LDL
CHOL/HDL RATIO: 4.3 ratio (ref <5.0)
CHOLESTEROL: 205 mg/dL — AB (ref <200)
HDL: 48 mg/dL — AB (ref >50)
LDL-Cholesterol: 131 mg/dL — ABNORMAL HIGH
Non-HDL Cholesterol (Calc): 157 mg/dL — ABNORMAL HIGH (ref <130)
Triglycerides: 151 mg/dL — ABNORMAL HIGH (ref <150)

## 2016-10-31 LAB — HEPATITIS C ANTIBODY: HCV Ab: NONREACTIVE

## 2016-12-26 ENCOUNTER — Other Ambulatory Visit: Payer: Self-pay | Admitting: Family Medicine

## 2017-02-02 ENCOUNTER — Other Ambulatory Visit: Payer: Self-pay | Admitting: Family Medicine

## 2017-02-02 DIAGNOSIS — I10 Essential (primary) hypertension: Secondary | ICD-10-CM

## 2017-02-26 DIAGNOSIS — M4800 Spinal stenosis, site unspecified: Secondary | ICD-10-CM

## 2017-02-26 HISTORY — DX: Spinal stenosis, site unspecified: M48.00

## 2017-04-25 ENCOUNTER — Ambulatory Visit (INDEPENDENT_AMBULATORY_CARE_PROVIDER_SITE_OTHER): Payer: Medicare Other | Admitting: Family Medicine

## 2017-04-25 ENCOUNTER — Encounter: Payer: Self-pay | Admitting: Family Medicine

## 2017-04-25 VITALS — BP 138/72 | HR 82 | Ht 69.0 in | Wt 223.0 lb

## 2017-04-25 DIAGNOSIS — I10 Essential (primary) hypertension: Secondary | ICD-10-CM

## 2017-04-25 DIAGNOSIS — R7301 Impaired fasting glucose: Secondary | ICD-10-CM | POA: Diagnosis not present

## 2017-04-25 DIAGNOSIS — M48062 Spinal stenosis, lumbar region with neurogenic claudication: Secondary | ICD-10-CM

## 2017-04-25 LAB — POCT GLYCOSYLATED HEMOGLOBIN (HGB A1C): Hemoglobin A1C: 5.8

## 2017-04-25 LAB — BASIC METABOLIC PANEL WITH GFR
BUN/Creatinine Ratio: 15 (calc) (ref 6–22)
BUN: 15 mg/dL (ref 7–25)
CO2: 28 mmol/L (ref 20–32)
CREATININE: 0.99 mg/dL — AB (ref 0.60–0.93)
Calcium: 9.7 mg/dL (ref 8.6–10.4)
Chloride: 100 mmol/L (ref 98–110)
GFR, EST NON AFRICAN AMERICAN: 57 mL/min/{1.73_m2} — AB (ref >=60)
GFR, Est African American: 66 mL/min/{1.73_m2} (ref >=60)
Glucose, Bld: 106 mg/dL — ABNORMAL HIGH (ref 65–99)
Potassium: 3.9 mmol/L (ref 3.5–5.3)
SODIUM: 136 mmol/L (ref 135–146)

## 2017-04-25 MED ORDER — AMBULATORY NON FORMULARY MEDICATION: 0 refills | Status: DC

## 2017-04-25 NOTE — Progress Notes (Signed)
Subjective:    CC: BP and glucose  HPI:  Hypertension- Pt denies chest pain, SOB, dizziness, or heart palpitations.  Taking meds as directed w/o problems.  Denies medication side effects.    Impaired fasting glucose-no increased thirst or urination. No symptoms consistent with hypoglycemia.  With her spinal stenosis she still having some difficulty getting up and down from the sitting position but has been trying to go to water aerobics.  Most weeks she is able to get 2 days then but her goal is 3 days/week.  Past medical history, Surgical history, Family history not pertinant except as noted below, Social history, Allergies, and medications have been entered into the medical record, reviewed, and corrections made.   Review of Systems: No fevers, chills, night sweats, weight loss, chest pain, or shortness of breath.   Objective:    General: Well Developed, well nourished, and in no acute distress.  Neuro: Alert and oriented x3, extra-ocular muscles intact, sensation grossly intact.  HEENT: Normocephalic, atraumatic  Skin: Warm and dry, no rashes. Cardiac: Regular rate and rhythm, no murmurs rubs or gallops, no lower extremity edema.  Respiratory: Clear to auscultation bilaterally. Not using accessory muscles, speaking in full sentences.   Impression and Recommendations:    HTN - Well controlled. Continue current regimen. Follow up in  6 months.   IFG -controlled.  Hemoglobin A1c of 5.8 today which is fantastic.  Continue current regimen continue water aerobics.  Follow-up in 6 months.  Spinal stenosis-saltation with Dr. Franky Machoabbell was in March 2018.  He recommended either injections or possible surgery or continuing to monitor for progression of symptoms.  It is up to her at this point for when she would like to return.  Continue with regular exercise. Given copy of note from Dr. Madelon Lipsabeel.    Pescription for tetanus vaccine given to that she can get it at the pharmacy.  Additional  information provided about the Shingrix vaccine.  This would also be covered at her pharmacy.

## 2017-04-25 NOTE — Progress Notes (Signed)
All labs are normal. 

## 2017-06-10 ENCOUNTER — Other Ambulatory Visit: Payer: Self-pay | Admitting: Family Medicine

## 2017-06-10 DIAGNOSIS — Z1231 Encounter for screening mammogram for malignant neoplasm of breast: Secondary | ICD-10-CM

## 2017-07-04 ENCOUNTER — Ambulatory Visit
Admission: RE | Admit: 2017-07-04 | Discharge: 2017-07-04 | Disposition: A | Discharge status: Home / Self Care | Payer: Medicare Other | Source: Ambulatory Visit | Attending: Family Medicine | Admitting: Family Medicine

## 2017-07-04 ENCOUNTER — Telehealth: Payer: Self-pay | Admitting: *Deleted

## 2017-07-04 DIAGNOSIS — Z1231 Encounter for screening mammogram for malignant neoplasm of breast: Secondary | ICD-10-CM | POA: Diagnosis not present

## 2017-07-04 NOTE — Telephone Encounter (Signed)
Returning pt's call..[name], CMA  

## 2017-07-05 NOTE — Telephone Encounter (Signed)
Returning pt's call.[name], Viann Shove, CMA

## 2017-08-14 ENCOUNTER — Ambulatory Visit (INDEPENDENT_AMBULATORY_CARE_PROVIDER_SITE_OTHER): Payer: Medicare Other | Admitting: Women's Health

## 2017-08-14 ENCOUNTER — Encounter: Payer: Self-pay | Admitting: Women's Health

## 2017-08-14 VITALS — BP 162/80 | Ht 69.0 in | Wt 226.0 lb

## 2017-08-14 DIAGNOSIS — Z13820 Encounter for screening for osteoporosis: Secondary | ICD-10-CM | POA: Diagnosis not present

## 2017-08-14 DIAGNOSIS — Z01419 Encounter for gynecological examination (general) (routine) without abnormal findings: Secondary | ICD-10-CM | POA: Diagnosis not present

## 2017-08-14 NOTE — Patient Instructions (Signed)
Health Maintenance for Postmenopausal Women Menopause is a normal process in which your reproductive ability comes to an end. This process happens gradually over a span of months to years, usually between the ages of 22 and 9. Menopause is complete when you have missed 12 consecutive menstrual periods. It is important to talk with your health care provider about some of the most common conditions that affect postmenopausal women, such as heart disease, cancer, and bone loss (osteoporosis). Adopting a healthy lifestyle and getting preventive care can help to promote your health and wellness. Those actions can also lower your chances of developing some of these common conditions. What should I know about menopause? During menopause, you may experience a number of symptoms, such as:  Moderate-to-severe hot flashes.  Night sweats.  Decrease in sex drive.  Mood swings.  Headaches.  Tiredness.  Irritability.  Memory problems.  Insomnia.  Choosing to treat or not to treat menopausal changes is an individual decision that you make with your health care provider. What should I know about hormone replacement therapy and supplements? Hormone therapy products are effective for treating symptoms that are associated with menopause, such as hot flashes and night sweats. Hormone replacement carries certain risks, especially as you become older. If you are thinking about using estrogen or estrogen with progestin treatments, discuss the benefits and risks with your health care provider. What should I know about heart disease and stroke? Heart disease, heart attack, and stroke become more likely as you age. This may be due, in part, to the hormonal changes that your body experiences during menopause. These can affect how your body processes dietary fats, triglycerides, and cholesterol. Heart attack and stroke are both medical emergencies. There are many things that you can do to help prevent heart disease  and stroke:  Have your blood pressure checked at least every 1-2 years. High blood pressure causes heart disease and increases the risk of stroke.  If you are 53-22 years old, ask your health care provider if you should take aspirin to prevent a heart attack or a stroke.  Do not use any tobacco products, including cigarettes, chewing tobacco, or electronic cigarettes. If you need help quitting, ask your health care provider.  It is important to eat a healthy diet and maintain a healthy weight. ? Be sure to include plenty of vegetables, fruits, low-fat dairy products, and lean protein. ? Avoid eating foods that are high in solid fats, added sugars, or salt (sodium).  Get regular exercise. This is one of the most important things that you can do for your health. ? Try to exercise for at least 150 minutes each week. The type of exercise that you do should increase your heart rate and make you sweat. This is known as moderate-intensity exercise. ? Try to do strengthening exercises at least twice each week. Do these in addition to the moderate-intensity exercise.  Know your numbers.Ask your health care provider to check your cholesterol and your blood glucose. Continue to have your blood tested as directed by your health care provider.  What should I know about cancer screening? There are several types of cancer. Take the following steps to reduce your risk and to catch any cancer development as early as possible. Breast Cancer  Practice breast self-awareness. ? This means understanding how your breasts normally appear and feel. ? It also means doing regular breast self-exams. Let your health care provider know about any changes, no matter how small.  If you are 40  or older, have a clinician do a breast exam (clinical breast exam or CBE) every year. Depending on your age, family history, and medical history, it may be recommended that you also have a yearly breast X-ray (mammogram).  If you  have a family history of breast cancer, talk with your health care provider about genetic screening.  If you are at high risk for breast cancer, talk with your health care provider about having an MRI and a mammogram every year.  Breast cancer (BRCA) gene test is recommended for women who have family members with BRCA-related cancers. Results of the assessment will determine the need for genetic counseling and BRCA1 and for BRCA2 testing. BRCA-related cancers include these types: ? Breast. This occurs in males or females. ? Ovarian. ? Tubal. This may also be called fallopian tube cancer. ? Cancer of the abdominal or pelvic lining (peritoneal cancer). ? Prostate. ? Pancreatic.  Cervical, Uterine, and Ovarian Cancer Your health care provider may recommend that you be screened regularly for cancer of the pelvic organs. These include your ovaries, uterus, and vagina. This screening involves a pelvic exam, which includes checking for microscopic changes to the surface of your cervix (Pap test).  For women ages 21-65, health care providers may recommend a pelvic exam and a Pap test every three years. For women ages 79-65, they may recommend the Pap test and pelvic exam, combined with testing for human papilloma virus (HPV), every five years. Some types of HPV increase your risk of cervical cancer. Testing for HPV may also be done on women of any age who have unclear Pap test results.  Other health care providers may not recommend any screening for nonpregnant women who are considered low risk for pelvic cancer and have no symptoms. Ask your health care provider if a screening pelvic exam is right for you.  If you have had past treatment for cervical cancer or a condition that could lead to cancer, you need Pap tests and screening for cancer for at least 20 years after your treatment. If Pap tests have been discontinued for you, your risk factors (such as having a new sexual partner) need to be  reassessed to determine if you should start having screenings again. Some women have medical problems that increase the chance of getting cervical cancer. In these cases, your health care provider may recommend that you have screening and Pap tests more often.  If you have a family history of uterine cancer or ovarian cancer, talk with your health care provider about genetic screening.  If you have vaginal bleeding after reaching menopause, tell your health care provider.  There are currently no reliable tests available to screen for ovarian cancer.  Lung Cancer Lung cancer screening is recommended for adults 69-62 years old who are at high risk for lung cancer because of a history of smoking. A yearly low-dose CT scan of the lungs is recommended if you:  Currently smoke.  Have a history of at least 30 pack-years of smoking and you currently smoke or have quit within the past 15 years. A pack-year is smoking an average of one pack of cigarettes per day for one year.  Yearly screening should:  Continue until it has been 15 years since you quit.  Stop if you develop a health problem that would prevent you from having lung cancer treatment.  Colorectal Cancer  This type of cancer can be detected and can often be prevented.  Routine colorectal cancer screening usually begins at  age 42 and continues through age 45.  If you have risk factors for colon cancer, your health care provider may recommend that you be screened at an earlier age.  If you have a family history of colorectal cancer, talk with your health care provider about genetic screening.  Your health care provider may also recommend using home test kits to check for hidden blood in your stool.  A small camera at the end of a tube can be used to examine your colon directly (sigmoidoscopy or colonoscopy). This is done to check for the earliest forms of colorectal cancer.  Direct examination of the colon should be repeated every  5-10 years until age 71. However, if early forms of precancerous polyps or small growths are found or if you have a family history or genetic risk for colorectal cancer, you may need to be screened more often.  Skin Cancer  Check your skin from head to toe regularly.  Monitor any moles. Be sure to tell your health care provider: ? About any new moles or changes in moles, especially if there is a change in a mole's shape or color. ? If you have a mole that is larger than the size of a pencil eraser.  If any of your family members has a history of skin cancer, especially at a young age, talk with your health care provider about genetic screening.  Always use sunscreen. Apply sunscreen liberally and repeatedly throughout the day.  Whenever you are outside, protect yourself by wearing long sleeves, pants, a wide-brimmed hat, and sunglasses.  What should I know about osteoporosis? Osteoporosis is a condition in which bone destruction happens more quickly than new bone creation. After menopause, you may be at an increased risk for osteoporosis. To help prevent osteoporosis or the bone fractures that can happen because of osteoporosis, the following is recommended:  If you are 46-71 years old, get at least 1,000 mg of calcium and at least 600 mg of vitamin D per day.  If you are older than age 55 but younger than age 65, get at least 1,200 mg of calcium and at least 600 mg of vitamin D per day.  If you are older than age 54, get at least 1,200 mg of calcium and at least 800 mg of vitamin D per day.  Smoking and excessive alcohol intake increase the risk of osteoporosis. Eat foods that are rich in calcium and vitamin D, and do weight-bearing exercises several times each week as directed by your health care provider. What should I know about how menopause affects my mental health? Depression may occur at any age, but it is more common as you become older. Common symptoms of depression  include:  Low or sad mood.  Changes in sleep patterns.  Changes in appetite or eating patterns.  Feeling an overall lack of motivation or enjoyment of activities that you previously enjoyed.  Frequent crying spells.  Talk with your health care provider if you think that you are experiencing depression. What should I know about immunizations? It is important that you get and maintain your immunizations. These include:  Tetanus, diphtheria, and pertussis (Tdap) booster vaccine.  Influenza every year before the flu season begins.  Pneumonia vaccine.  Shingles vaccine.  Your health care provider may also recommend other immunizations. This information is not intended to replace advice given to you by your health care provider. Make sure you discuss any questions you have with your health care provider. Document Released: 04/06/2005  Document Revised: 09/02/2015 Document Reviewed: 11/16/2014 Elsevier Interactive Patient Education  2018 Elsevier Inc.  

## 2017-08-14 NOTE — Progress Notes (Signed)
[name] 07-10-1945 [id number]    History:    Presents for breast and pelvic exam. Normal Pap and  mammogram history. TAH for fibroids on no HRT. 2013 Dexa +3.8 at the hip, +1.7 at spine. 2009 negative colonoscopy. Not sexually active r/t husband's health. Minimal vaginal dryness. Lumbar stenosis, stable and no longer managed by orthopedic specialist, or physical therapist, no current pain with herbal joint supplements (name unknown but one of them contains turmeric). Chronic bilateral leg weakness, having difficulty with position changes (sitting to standing and standing to sitting), resulting in more weight on bilateral wrists.  Past medical history, past surgical history, family history and social history were all reviewed and documented in the EPIC chart. Primary care manages hypertension and hypercholesterolemia. Shingles and pneumonia vaccines current. Retired Psychologist, occupationalbanker. Parents, sister, brother, and numerous family members hypertension. Sister and 54-y/o niece died of breast cancer (no mother/daughter relation).   ROS:  A ROS was performed and pertinent positives and negatives are included.  Exam:  Vitals:   08/14/17 1056  BP: (!) 162/80  Weight: 226 lb (102.5 kg)  Height: 5\' 9"  (1.753 m)   Body mass index is 33.37 kg/m.   General appearance:  Normal Thyroid:  Symmetrical, normal in size, without palpable masses or nodularity. Respiratory  Auscultation:  Clear without wheezing or rhonchi Cardiovascular  Auscultation:  Regular rate, without rubs, murmurs or gallops  Edema/varicosities:  Not grossly evident Abdominal  Soft,nontender, without masses, guarding or rebound.  Liver/spleen:  No organomegaly noted  Hernia:  None appreciated  Skin  Inspection:  Grossly normal   Breasts: Examined lying and sitting. History of breast reduction, pendulous    Right: Without masses, retractions, discharge or axillary adenopathy.     Left: Without masses, retractions, discharge or  axillary adenopathy. Gentitourinary   Inguinal/mons:  Normal without inguinal adenopathy  External genitalia:  Normal  BUS/Urethra/Skene's glands:  Normal  Vagina:  Atrophy  Cervix:  Absent  Uterus:  Absent  Adnexa/parametria:     Rt: Without masses or tenderness.   Lt: Without masses or tenderness.  Anus and perineum: Normal  Digital rectal exam: Normal sphincter tone without palpated masses or tenderness  Assessment/Plan:  72 y.o.  MBF G0 presents for breast and pelvic exam with complaint of chronic back pain with leg weakness.  Bilateral leg weakness TAH for fibroids on no HRT/mild vaginal atrophy Hypertension/hypercholesterolemia, primary care manages labs and meds Obesity  Plan: SBE's, continue annual screening, calcium rich diet, Vitamin d 1000 daily. Discussed healthy diet, exercise, home safety, fall prevention,importance of weight bearing exercise, interventions to assist with position changes, and  A & D cream for vaginal irritation. Encouraged to increase exercise and decrease calories.  6 pound weight gain in the past year reviewed importance of decreasing calorie/carbs and increasing exercise as able.   Dexa scheduled.. Elevated BP during visit, has not taken BP med today yet, aware of importance of taking medication daily.  Harrington Challengerancy J Young [facility] Center[facility]NP, 11:34 AM 08/14/2017

## 2017-08-23 ENCOUNTER — Other Ambulatory Visit: Payer: Self-pay | Admitting: Family Medicine

## 2017-09-17 ENCOUNTER — Encounter: Payer: Self-pay | Admitting: Gynecology

## 2017-09-17 ENCOUNTER — Other Ambulatory Visit: Payer: Self-pay | Admitting: Gynecology

## 2017-09-17 ENCOUNTER — Ambulatory Visit (INDEPENDENT_AMBULATORY_CARE_PROVIDER_SITE_OTHER): Payer: Medicare Other

## 2017-09-17 DIAGNOSIS — Z780 Asymptomatic menopausal state: Secondary | ICD-10-CM | POA: Diagnosis not present

## 2017-09-17 DIAGNOSIS — Z13820 Encounter for screening for osteoporosis: Secondary | ICD-10-CM

## 2017-10-01 ENCOUNTER — Other Ambulatory Visit: Payer: Self-pay | Admitting: Family Medicine

## 2017-10-01 DIAGNOSIS — I10 Essential (primary) hypertension: Secondary | ICD-10-CM

## 2017-10-10 ENCOUNTER — Ambulatory Visit (INDEPENDENT_AMBULATORY_CARE_PROVIDER_SITE_OTHER): Payer: Medicare Other | Admitting: Sports Medicine

## 2017-10-10 ENCOUNTER — Encounter: Payer: Self-pay | Admitting: Sports Medicine

## 2017-10-10 ENCOUNTER — Ambulatory Visit: Payer: Medicare Other | Admitting: Sports Medicine

## 2017-10-10 DIAGNOSIS — M170 Bilateral primary osteoarthritis of knee: Secondary | ICD-10-CM | POA: Diagnosis not present

## 2017-10-10 NOTE — Assessment & Plan Note (Signed)
Right knee injection provided for years of relief, still doing well, left knee injected and aspirated today. Rehab exercises given, return in 1 month.

## 2017-10-10 NOTE — Progress Notes (Signed)
Subjective:    CC: Left knee pain  HPI: [name] is a pleasant 72 year old female, we injected her right knee approximately 4 years ago, she has done well since.  Right knee continues to do well but she is developed pain in her left knee, moderate, persistent with moderate swelling.  No mechanical symptoms, no trauma, no constitutional symptoms.  Over-the-counter analgesics are not effective.  I reviewed the past medical history, family history, social history, surgical history, and allergies today and no changes were needed.  Please see the problem list section below in epic for further details.  Past Medical History: Past Medical History:  Diagnosis Date  . Diverticulosis   . Hiatal hernia   . Hyperlipidemia    borderline  . Hypertension    Past Surgical History: Past Surgical History:  Procedure Laterality Date  . ABDOMINAL HYSTERECTOMY     TAH Pt. unsure if BSO  . BREAST REDUCTION SURGERY  1994  . BREAST SURGERY     Reduction  . LIPOSUCTION    . REDUCTION MAMMAPLASTY Bilateral 1994   Social History: Social History   Socioeconomic History  . Marital status: Married    Spouse name: Not on file  . Number of children: 0  . Years of education: Not on file  . Highest education level: Not on file  Occupational History  . Not on file  Social Needs  . Financial resource strain: Not on file  . Food insecurity:    Worry: Not on file    Inability: Not on file  . Transportation needs:    Medical: Not on file    Non-medical: Not on file  Tobacco Use  . Smoking status: Never Smoker  . Smokeless tobacco: Never Used  Substance and Sexual Activity  . Alcohol use: No    Alcohol/week: 0.0 standard drinks  . Drug use: No  . Sexual activity: Not Currently    Partners: Male    Comment: HYST-1st intercourse 72 yo-5 partners  Lifestyle  . Physical activity:    Days per week: Not on file    Minutes per session: Not on file  . Stress: Not on file  Relationships  . Social  connections:    Talks on phone: Not on file    Gets together: Not on file    Attends religious service: Not on file    Active member of club or organization: Not on file    Attends meetings of clubs or organizations: Not on file    Relationship status: Not on file  Other Topics Concern  . Not on file  Social History Narrative   Working out some.    Family History: Family History  Problem Relation Age of Onset  . Breast cancer Sister [id number]       COD breast cancer and pneumonia  . Hypertension Sister   . Heart attack Father   . Hypertension Father   . Hypertension Mother   . Kidney disease Mother   . Breast cancer Other        ages 7549 and 51--2 nieces  . Hypertension Brother   . Colon cancer Neg Hx   . Esophageal cancer Neg Hx   . Rectal cancer Neg Hx   . Stomach cancer Neg Hx    Allergies: Allergies  Allergen Reactions  . Penicillins Rash    REACTION: rash   Medications: See med rec.  Review of Systems: No fevers, chills, night sweats, weight loss, chest pain, or shortness of breath.  Objective:    General: Well Developed, well nourished, and in no acute distress.  Neuro: Alert and oriented x3, extra-ocular muscles intact, sensation grossly intact.  HEENT: Normocephalic, atraumatic, pupils equal round reactive to light, neck supple, no masses, no lymphadenopathy, thyroid nonpalpable.  Skin: Warm and dry, no rashes. Cardiac: Regular rate and rhythm, no murmurs rubs or gallops, no lower extremity edema.  Respiratory: Clear to auscultation bilaterally. Not using accessory muscles, speaking in full sentences. Left knee: Visibly swollen with a palpable fluid wave and effusion, tenderness at the medial joint line. ROM normal in flexion and extension and lower leg rotation. Ligaments with solid consistent endpoints including ACL, PCL, LCL, MCL. Negative Mcmurray's and provocative meniscal tests. Non painful patellar compression. Patellar and quadriceps tendons  unremarkable. Hamstring and quadriceps strength is normal.  Procedure: Real-time Ultrasound Guided aspiration/injection of left knee Device: GE Logiq E  Verbal informed consent obtained.  Time-out conducted.  Noted no overlying erythema, induration, or other signs of local infection.  Skin prepped in a sterile fashion.  Local anesthesia: Topical Ethyl chloride.  With sterile technique and under real time ultrasound guidance: I advanced an 18-gauge needle into the suprapatellar recess, aspirated 20 mL of clear, straw-colored fluid, syringe switched and 1 cc kenalog 40, 2 cc lidocaine, 2 cc bupivacaine injected easily Completed without difficulty  Pain immediately resolved suggesting accurate placement of the medication.  Advised to call if fevers/chills, erythema, induration, drainage, or persistent bleeding.  Images permanently stored and available for review in the ultrasound unit.  Impression: Technically successful ultrasound guided injection.  Impression and Recommendations:    Primary osteoarthritis of both knees Right knee injection provided for years of relief, still doing well, left knee injected and aspirated today. Rehab exercises given, return in 1 month. ___________________________________________ Ihor Austinhomas J. Benjamin Stainhekkekandam, M.D., ABFM., CAQSM. Primary Care and Sports Medicine [facility]  Adjunct Instructor of Family Medicine  [employer]

## 2017-10-23 ENCOUNTER — Encounter: Payer: Medicare Other | Admitting: Family Medicine

## 2017-11-05 ENCOUNTER — Encounter: Payer: Medicare Other | Admitting: Family Medicine

## 2017-11-06 NOTE — Progress Notes (Signed)
Subjective:   [name] is a 72 y.o. female who presents for Medicare Annual (Subsequent) preventive examination.  Review of Systems:  No ROS.  Medicare Wellness Visit. Additional risk factors are reflected in the social history.  Cardiac Risk Factors include: dyslipidemia;hypertension;advanced age (>58men, >73 women);sedentary lifestyle Sleep patterns:   Home Safety/Smoke Alarms: Feels safe in home. Smoke alarms in place.  Living environment;Lives with husband in 2 story home, hand rails in place. Step over shower in place no hand rails in place but patient wants to get them in place.  Does some brain stimulating by reading.  Female:   Pap- Aged out      Mammo- due 07/05/2018      Dexa scan- utd       CCS- due 12/05/2021     Objective:     Vitals: BP (!) 142/64   Pulse 77   Ht 5\' 9"  (1.753 m)   Wt 225 lb (102.1 kg)   SpO2 99%   BMI 33.23 kg/m   Body mass index is 33.23 kg/m.  Advanced Directives 11/12/2017 06/10/2014  Does Patient Have a Medical Advance Directive? No No  Would patient like information on creating a medical advance directive? Yes (MAU/Ambulatory/Procedural Areas - Information given) Yes - Educational materials given    Tobacco Social History   Tobacco Use  Smoking Status Never Smoker  Smokeless Tobacco Never Used     Counseling given: Not Answered   Clinical Intake:  Pre-visit preparation completed: Yes  Pain : No/denies pain     Nutritional Risks: None Diabetes: No  How often do you need to have someone help you when you read instructions, pamphlets, or other written materials from your doctor or pharmacy?: 1 - Never What is the last grade level you completed in school?: 14  Interpreter Needed?: No     Past Medical History:  Diagnosis Date  . Diverticulosis   . Hiatal hernia   . Hyperlipidemia    borderline  . Hypertension    Past Surgical History:  Procedure Laterality Date  . ABDOMINAL HYSTERECTOMY     TAH Pt.  unsure if BSO  . BREAST REDUCTION SURGERY  1994  . BREAST SURGERY     Reduction  . LIPOSUCTION    . REDUCTION MAMMAPLASTY Bilateral 1994   Family History  Problem Relation Age of Onset  . Breast cancer Sister 3       COD breast cancer and pneumonia  . Hypertension Sister   . Heart attack Father   . Hypertension Father   . Hypertension Mother   . Kidney disease Mother   . Breast cancer Other        ages 60 and 51--2 nieces  . Hypertension Brother   . Colon cancer Neg Hx   . Esophageal cancer Neg Hx   . Rectal cancer Neg Hx   . Stomach cancer Neg Hx    Social History   Socioeconomic History  . Marital status: Married    Spouse name: Jomarie Longs  . Number of children: 0  . Years of education: 39  . Highest education level: 12th grade  Occupational History  . Occupation: retired    Comment: land of [employer]  Social Needs  . Financial resource strain: Not hard at all  . Food insecurity:    Worry: Never true    Inability: Never true  . Transportation needs:    Medical: No    Non-medical: No  Tobacco Use  . Smoking  status: Never Smoker  . Smokeless tobacco: Never Used  Substance and Sexual Activity  . Alcohol use: No    Alcohol/week: 0.0 standard drinks  . Drug use: No  . Sexual activity: Not Currently    Partners: Male    Comment: HYST-1st intercourse 34 yo-5 partners  Lifestyle  . Physical activity:    Days per week: 1 day    Minutes per session: 40 min  . Stress: Not at all  Relationships  . Social connections:    Talks on phone: More than three times a week    Gets together: Once a week    Attends religious service: More than 4 times per year    Active member of club or organization: Yes    Attends meetings of clubs or organizations: More than 4 times per year    Relationship status: Married  Other Topics Concern  . Not on file  Social History Narrative   Exercising some 1 day a week, goes to [facility] and does aerobics in the water and walks on  treadmill. Discussed with patient to increase frequency of exercise a week.    Outpatient Encounter Medications as of 11/12/2017  Medication Sig  . AMBULATORY NON FORMULARY MEDICATION Medication Name: Tdap IM x a  . amLODipine (NORVASC) 5 MG tablet TAKE 1 TABLET BY MOUTH EVERY DAY  . aspirin 81 MG tablet Take 81 mg by mouth daily.    . Calcium Carbonate-Vitamin D (CALCIUM 600+D) 600-200 MG-UNIT TABS Take by mouth.    Marland Kitchen lisinopril-hydrochlorothiazide (PRINZIDE,ZESTORETIC) 20-12.5 MG tablet TAKE 1 TABLET BY MOUTH DAILY.  Marland Kitchen MAGNESIUM PO Take 2 tablets by mouth daily.  . Multiple Vitamins-Minerals (MULTIVITAMIN PO) Take by mouth.  . Omega-3 Fatty Acids (FISH OIL) 1000 MG CPDR Take by mouth.    Marland Kitchen OVER THE COUNTER MEDICATION Curamin Extra Strength PO  . OVER THE COUNTER MEDICATION Joint Astin PO  . Probiotic Product (PROBIOTIC DAILY PO) Take by mouth.   No facility-administered encounter medications on file as of 11/12/2017.     Activities of Daily Living In your present state of health, do you have any difficulty performing the following activities: 11/12/2017  Hearing? N  Vision? N  Comment wears reading glasses for reading  Difficulty concentrating or making decisions? N  Walking or climbing stairs? Y  Comment struggle with back and lef pain  Dressing or bathing? N  Doing errands, shopping? N  Preparing Food and eating ? N  Using the Toilet? N  In the past six months, have you accidently leaked urine? N  Do you have problems with loss of bowel control? N  Managing your Medications? N  Managing your Finances? N  Housekeeping or managing your Housekeeping? N  Some recent data might be hidden    Patient Care Team: Agapito Games, MD as PCP - [facility] Davonna Belling, NP as Nurse Practitioner (Obstetrics and Gynecology)    Assessment:   This is a routine wellness examination for [place].Physical assessment deferred to PCP.   Exercise Activities and Dietary  recommendations Current Exercise Habits: The patient does not participate in regular exercise at present, Exercise limited by: orthopedic condition(s) Diet Does eat apples and banana. Eats out 2-3 times a week. Eats some vegetables. Breakfast:oatmeal with banana Lunch: sandwich Dinner:  Meat and vegetables     Goals    . DIET - INCREASE WATER INTAKE (pt-stated)     Increase water intake to 4-5 bottles a day.    . Exercise 3x  per week (30 min per time)     Plans to start exercising with JB and/or her sister.      . Exercise 3x per week (30 min per time)     Increase exercise routine to 3 times a week for 30 minutes at a time.    . Weight (lb) < 200 lb (90.7 kg) (pt-stated)     Exercise more. And will try and loose weight within the next year.       Fall Risk Fall Risk  11/12/2017 10/22/2016 10/21/2015 10/20/2015 06/10/2014  Falls in the past year? No No No No No  Comment - - Emmi Telephone Survey: data to providers prior to load - -   Is the patient's home free of loose throw rugs in walkways, pet beds, electrical cords, etc?   yes      Grab bars in the bathroom? no      Handrails on the stairs?   yes      Adequate lighting?   yes  Depression Screen PHQ 2/9 Scores 11/12/2017 04/25/2017 10/22/2016 05/04/2016  PHQ - 2 Score 0 0 0 1  PHQ- 9 Score - 1 - -     Cognitive Function     6CIT Screen 11/12/2017  What Year? 0 points  What month? 0 points  What time? 0 points  Count back from 20 0 points  Months in reverse 2 points  Repeat phrase 0 points  Total Score 2    Immunization History  Administered Date(s) Administered  . Influenza Split 01/30/2011  . Influenza,inj,Quad PF,6+ Mos 12/02/2012, 11/17/2013, 12/13/2014, 10/20/2015, 10/22/2016  . Pneumococcal Conjugate-13 06/10/2014  . Pneumococcal Polysaccharide-23 01/30/2011  . Td 02/27/1995, 02/27/2007    Screening Tests Health Maintenance  Topic Date Due  . INFLUENZA VACCINE  09/26/2017  . TETANUS/TDAP  04/25/2018  (Originally 02/26/2017)  . MAMMOGRAM  07/05/2019  . COLONOSCOPY  12/05/2021  . DEXA SCAN  Completed  . Hepatitis C Screening  Completed  . PNA vac Low Risk Adult  Completed      Plan:    Please schedule your next medicare wellness visit with me in 1 yr.  Ms. Spackman , Thank you for taking time to come for your Medicare Wellness Visit. I appreciate your ongoing commitment to your health goals. Please review the following plan we discussed and let me know if I can assist you in the future.   These are the goals we discussed: Goals    . DIET - INCREASE WATER INTAKE (pt-stated)     Increase water intake to 4-5 bottles a day.    . Exercise 3x per week (30 min per time)     Plans to start exercising with JB and/or her sister.      . Exercise 3x per week (30 min per time)     Increase exercise routine to 3 times a week for 30 minutes at a time.    . Weight (lb) < 200 lb (90.7 kg) (pt-stated)     Exercise more. And will try and loose weight within the next year.       This is a list of the screening recommended for you and due dates:  Health Maintenance  Topic Date Due  . Flu Shot  09/26/2017  . Tetanus Vaccine  04/25/2018*  . Mammogram  07/05/2019  . Colon Cancer Screening  12/05/2021  . DEXA scan (bone density measurement)  Completed  .  Hepatitis C: One time screening is recommended by Center  for Disease Control  (CDC) for  adults born from [birth year] through 1965.   Completed  . Pneumonia vaccines  Completed  *Topic was postponed. The date shown is not the original due date.   Continue doing brain stimulating activities (puzzles, reading, adult coloring books, staying active) to keep memory sharp.  Watch your portion sizes when eating and stop when full do not over eat.  I have personally reviewed and noted the following in the patient's chart:   . Medical and social history . Use of alcohol, tobacco or illicit drugs  . Current medications and supplements . Functional ability  and status . Nutritional status . Physical activity . Advanced directives . List of other physicians . Hospitalizations, surgeries, and ER visits in previous 12 months . Vitals . Screenings to include cognitive, depression, and falls . Referrals and appointments  In addition, I have reviewed and discussed with patient certain preventive protocols, quality metrics, and best practice recommendations. A written personalized care plan for preventive services as well as general preventive health recommendations were provided to patient.     Normand Sloop, LPN  11/19/2681

## 2017-11-07 ENCOUNTER — Ambulatory Visit (INDEPENDENT_AMBULATORY_CARE_PROVIDER_SITE_OTHER): Payer: Medicare Other | Admitting: Sports Medicine

## 2017-11-07 ENCOUNTER — Encounter: Payer: Self-pay | Admitting: Sports Medicine

## 2017-11-07 DIAGNOSIS — M51369 Other intervertebral disc degeneration, lumbar region without mention of lumbar back pain or lower extremity pain: Secondary | ICD-10-CM

## 2017-11-07 DIAGNOSIS — M5136 Other intervertebral disc degeneration, lumbar region: Secondary | ICD-10-CM | POA: Diagnosis not present

## 2017-11-07 DIAGNOSIS — M170 Bilateral primary osteoarthritis of knee: Secondary | ICD-10-CM

## 2017-11-07 NOTE — Progress Notes (Signed)
Subjective:    CC: Recheck knees  HPI: [name] returns, I aspirated and injected her left knee at the last visit a month ago, she did extremely well, really has no pain just some stiffness in the left knee.  Symptoms are moderate, persistent.  Localized without radiation.  I reviewed the past medical history, family history, social history, surgical history, and allergies today and no changes were needed.  Please see the problem list section below in epic for further details.  Past Medical History: Past Medical History:  Diagnosis Date  . Diverticulosis   . Hiatal hernia   . Hyperlipidemia    borderline  . Hypertension    Past Surgical History: Past Surgical History:  Procedure Laterality Date  . ABDOMINAL HYSTERECTOMY     TAH Pt. unsure if BSO  . BREAST REDUCTION SURGERY  1994  . BREAST SURGERY     Reduction  . LIPOSUCTION    . REDUCTION MAMMAPLASTY Bilateral 1994   Social History: Social History   Socioeconomic History  . Marital status: Married    Spouse name: Not on file  . Number of children: 0  . Years of education: Not on file  . Highest education level: Not on file  Occupational History  . Not on file  Social Needs  . Financial resource strain: Not on file  . Food insecurity:    Worry: Not on file    Inability: Not on file  . Transportation needs:    Medical: Not on file    Non-medical: Not on file  Tobacco Use  . Smoking status: Never Smoker  . Smokeless tobacco: Never Used  Substance and Sexual Activity  . Alcohol use: No    Alcohol/week: 0.0 standard drinks  . Drug use: No  . Sexual activity: Not Currently    Partners: Male    Comment: HYST-1st intercourse 72 yo-5 partners  Lifestyle  . Physical activity:    Days per week: Not on file    Minutes per session: Not on file  . Stress: Not on file  Relationships  . Social connections:    Talks on phone: Not on file    Gets together: Not on file    Attends religious service: Not on file   Active member of club or organization: Not on file    Attends meetings of clubs or organizations: Not on file    Relationship status: Not on file  Other Topics Concern  . Not on file  Social History Narrative   Working out some.    Family History: Family History  Problem Relation Age of Onset  . Breast cancer Sister [id number]       COD breast cancer and pneumonia  . Hypertension Sister   . Heart attack Father   . Hypertension Father   . Hypertension Mother   . Kidney disease Mother   . Breast cancer Other        ages 89 and 51--2 nieces  . Hypertension Brother   . Colon cancer Neg Hx   . Esophageal cancer Neg Hx   . Rectal cancer Neg Hx   . Stomach cancer Neg Hx    Allergies: Allergies  Allergen Reactions  . Penicillins Rash    REACTION: rash   Medications: See med rec.  Review of Systems: No fevers, chills, night sweats, weight loss, chest pain, or shortness of breath.   Objective:    General: Well Developed, well nourished, and in no acute distress.  Neuro: Alert and oriented  x3, extra-ocular muscles intact, sensation grossly intact.  HEENT: Normocephalic, atraumatic, pupils equal round reactive to light, neck supple, no masses, no lymphadenopathy, thyroid nonpalpable.  Skin: Warm and dry, no rashes. Cardiac: Regular rate and rhythm, no murmurs rubs or gallops, no lower extremity edema.  Respiratory: Clear to auscultation bilaterally. Not using accessory muscles, speaking in full sentences. Left knee: Swollen with a palpable fluid wave and effusion. ROM normal in flexion and extension and lower leg rotation. Ligaments with solid consistent endpoints including ACL, PCL, LCL, MCL. Negative Mcmurray's and provocative meniscal tests. Non painful patellar compression. Patellar and quadriceps tendons unremarkable. Hamstring and quadriceps strength is normal.  Procedure: Real-time Ultrasound Guided aspiration of left knee Device: GE Logiq E  Verbal informed consent  obtained.  Time-out conducted.  Noted no overlying erythema, induration, or other signs of local infection.  Skin prepped in a sterile fashion.  Local anesthesia: Topical Ethyl chloride.  With sterile technique and under real time ultrasound guidance: Using an 18-gauge needle I aspirated 33 cc of slightly cloudy, straw-colored fluid. Completed without difficulty  Pain immediately resolved suggesting accurate placement of the medication.  Advised to call if fevers/chills, erythema, induration, drainage, or persistent bleeding.  Images permanently stored and available for review in the ultrasound unit.  Impression: Technically successful ultrasound guided injection.  Impression and Recommendations:    Primary osteoarthritis of both knees Did well after the injection at the last visit, she does have a recurrence of effusion, arthrocentesis today without injection. There was a bit of cloudiness so adding cell counts and crystal analysis. Return as needed.  Lumbar degenerative disc disease Adding formal physical therapy. They will work on her knee as well, return as needed. ___________________________________________ Ihor Austin. Benjamin Stain, M.D., ABFM., CAQSM. Primary Care and Sports Medicine [facility]  Adjunct Instructor of Family Medicine  [employer]

## 2017-11-07 NOTE — Assessment & Plan Note (Addendum)
Did well after the injection at the last visit, she does have a recurrence of effusion, arthrocentesis today without injection. There was a bit of cloudiness so adding cell counts and crystal analysis. Return as needed.

## 2017-11-07 NOTE — Assessment & Plan Note (Signed)
Adding formal physical therapy. They will work on her knee as well, return as needed.

## 2017-11-08 LAB — CELL COUNT AND DIFF, FLUID, OTHER
Basophils, %: 0 %
Eosinophils, %: 0 %
Lymphocytes, %: 24 %
Monocyte/Macrophage %: 26 %
Neutrophils, %: 50 %
Total Nucleated Cell Ct: 67 {cells}/uL

## 2017-11-08 LAB — SYNOVIAL FLUID, CRYSTAL

## 2017-11-12 ENCOUNTER — Ambulatory Visit (INDEPENDENT_AMBULATORY_CARE_PROVIDER_SITE_OTHER): Payer: Medicare Other | Admitting: *Deleted

## 2017-11-12 VITALS — BP 142/82 | HR 77 | Ht 69.0 in | Wt 225.0 lb

## 2017-11-12 DIAGNOSIS — Z0000 Encounter for general adult medical examination without abnormal findings: Secondary | ICD-10-CM | POA: Diagnosis not present

## 2017-11-12 NOTE — Patient Instructions (Addendum)
Please schedule your next medicare wellness visit with me in 1 yr.  [name] , Thank you for taking time to come for your Medicare Wellness Visit. I appreciate your ongoing commitment to your health goals. Please review the following plan we discussed and let me know if I can assist you in the future.   These are the goals we discussed:These are the goals we discussed: Goals    . DIET - INCREASE WATER INTAKE (pt-stated)     Increase water intake to 4-5 bottles a day.    . Exercise 3x per week (30 min per time)     Plans to start exercising with JB and/or her sister.       . Exercise 3x per week (30 min per time)     Increase exercise routine to 3 times a week for 30 minutes at a time.    . Weight (lb) < 200 lb (90.7 kg) (pt-stated)     Exercise more. And will try and loose weight within the next year.       This is a list of the screening recommended for you and due dates:  Health Maintenance    Health Maintenance for Postmenopausal Women Menopause is a normal process in which your reproductive ability comes to an end. This process happens gradually over a span of months to years, usually between the ages of 20 and 66. Menopause is complete when you have missed 12 consecutive menstrual periods. It is important to talk with your health care provider about some of the most common conditions that affect postmenopausal women, such as heart disease, cancer, and bone loss (osteoporosis). Adopting a healthy lifestyle and getting preventive care can help to promote your health and wellness. Those actions can also lower your chances of developing some of these common conditions. What should I know about menopause? During menopause, you may experience a number of symptoms, such as:  Moderate-to-severe hot flashes.  Night sweats.  Decrease in sex drive.  Mood swings.  Headaches.  Tiredness.  Irritability.  Memory problems.  Insomnia.  Choosing to treat or not to treat  menopausal changes is an individual decision that you make with your health care provider. What should I know about hormone replacement therapy and supplements? Hormone therapy products are effective for treating symptoms that are associated with menopause, such as hot flashes and night sweats. Hormone replacement carries certain risks, especially as you become older. If you are thinking about using estrogen or estrogen with progestin treatments, discuss the benefits and risks with your health care provider. What should I know about heart disease and stroke? Heart disease, heart attack, and stroke become more likely as you age. This may be due, in part, to the hormonal changes that your body experiences during menopause. These can affect how your body processes dietary fats, triglycerides, and cholesterol. Heart attack and stroke are both medical emergencies. There are many things that you can do to help prevent heart disease and stroke:  Have your blood pressure checked at least every 1-2 years. High blood pressure causes heart disease and increases the risk of stroke.  If you are 67-71 years old, ask your health care provider if you should take aspirin to prevent a heart attack or a stroke.  Do not use any tobacco products, including cigarettes, chewing tobacco, or electronic cigarettes. If you need help quitting, ask your health care provider.  It is important to eat a healthy diet and maintain a healthy weight. ? Be  sure to include plenty of vegetables, fruits, low-fat dairy products, and lean protein. ? Avoid eating foods that are high in solid fats, added sugars, or salt (sodium).  Get regular exercise. This is one of the most important things that you can do for your health. ? Try to exercise for at least 150 minutes each week. The type of exercise that you do should increase your heart rate and make you sweat. This is known as moderate-intensity exercise. ? Try to do strengthening  exercises at least twice each week. Do these in addition to the moderate-intensity exercise.  Know your numbers.Ask your health care provider to check your cholesterol and your blood glucose. Continue to have your blood tested as directed by your health care provider.  What should I know about cancer screening? There are several types of cancer. Take the following steps to reduce your risk and to catch any cancer development as early as possible. Breast Cancer  Practice breast self-awareness. ? This means understanding how your breasts normally appear and feel. ? It also means doing regular breast self-exams. Let your health care provider know about any changes, no matter how small.  If you are 68 or older, have a clinician do a breast exam (clinical breast exam or CBE) every year. Depending on your age, family history, and medical history, it may be recommended that you also have a yearly breast X-ray (mammogram).  If you have a family history of breast cancer, talk with your health care provider about genetic screening.  If you are at high risk for breast cancer, talk with your health care provider about having an MRI and a mammogram every year.  Breast cancer (BRCA) gene test is recommended for women who have family members with BRCA-related cancers. Results of the assessment will determine the need for genetic counseling and BRCA1 and for BRCA2 testing. BRCA-related cancers include these types: ? Breast. This occurs in males or females. ? Ovarian. ? Tubal. This may also be called fallopian tube cancer. ? Cancer of the abdominal or pelvic lining (peritoneal cancer). ? Prostate. ? Pancreatic.  Cervical, Uterine, and Ovarian Cancer Your health care provider may recommend that you be screened regularly for cancer of the pelvic organs. These include your ovaries, uterus, and vagina. This screening involves a pelvic exam, which includes checking for microscopic changes to the surface of  your cervix (Pap test).  For women ages 21-65, health care providers may recommend a pelvic exam and a Pap test every three years. For women ages 22-65, they may recommend the Pap test and pelvic exam, combined with testing for human papilloma virus (HPV), every five years. Some types of HPV increase your risk of cervical cancer. Testing for HPV may also be done on women of any age who have unclear Pap test results.  Other health care providers may not recommend any screening for nonpregnant women who are considered low risk for pelvic cancer and have no symptoms. Ask your health care provider if a screening pelvic exam is right for you.  If you have had past treatment for cervical cancer or a condition that could lead to cancer, you need Pap tests and screening for cancer for at least 20 years after your treatment. If Pap tests have been discontinued for you, your risk factors (such as having a new sexual partner) need to be reassessed to determine if you should start having screenings again. Some women have medical problems that increase the chance of getting cervical cancer. In  these cases, your health care provider may recommend that you have screening and Pap tests more often.  If you have a family history of uterine cancer or ovarian cancer, talk with your health care provider about genetic screening.  If you have vaginal bleeding after reaching menopause, tell your health care provider.  There are currently no reliable tests available to screen for ovarian cancer.  Lung Cancer Lung cancer screening is recommended for adults 42-76 years old who are at high risk for lung cancer because of a history of smoking. A yearly low-dose CT scan of the lungs is recommended if you:  Currently smoke.  Have a history of at least 30 pack-years of smoking and you currently smoke or have quit within the past 15 years. A pack-year is smoking an average of one pack of cigarettes per day for one year.  Yearly  screening should:  Continue until it has been 15 years since you quit.  Stop if you develop a health problem that would prevent you from having lung cancer treatment.  Colorectal Cancer  This type of cancer can be detected and can often be prevented.  Routine colorectal cancer screening usually begins at age 31 and continues through age 65.  If you have risk factors for colon cancer, your health care provider may recommend that you be screened at an earlier age.  If you have a family history of colorectal cancer, talk with your health care provider about genetic screening.  Your health care provider may also recommend using home test kits to check for hidden blood in your stool.  A small camera at the end of a tube can be used to examine your colon directly (sigmoidoscopy or colonoscopy). This is done to check for the earliest forms of colorectal cancer.  Direct examination of the colon should be repeated every 5-10 years until age 67. However, if early forms of precancerous polyps or small growths are found or if you have a family history or genetic risk for colorectal cancer, you may need to be screened more often.  Skin Cancer  Check your skin from head to toe regularly.  Monitor any moles. Be sure to tell your health care provider: ? About any new moles or changes in moles, especially if there is a change in a mole's shape or color. ? If you have a mole that is larger than the size of a pencil eraser.  If any of your family members has a history of skin cancer, especially at a young age, talk with your health care provider about genetic screening.  Always use sunscreen. Apply sunscreen liberally and repeatedly throughout the day.  Whenever you are outside, protect yourself by wearing long sleeves, pants, a wide-brimmed hat, and sunglasses.  What should I know about osteoporosis? Osteoporosis is a condition in which bone destruction happens more quickly than new bone creation.  After menopause, you may be at an increased risk for osteoporosis. To help prevent osteoporosis or the bone fractures that can happen because of osteoporosis, the following is recommended:  If you are 37-58 years old, get at least 1,000 mg of calcium and at least 600 mg of vitamin D per day.  If you are older than age 56 but younger than age 67, get at least 1,200 mg of calcium and at least 600 mg of vitamin D per day.  If you are older than age 27, get at least 1,200 mg of calcium and at least 800 mg of vitamin D  per day.  Smoking and excessive alcohol intake increase the risk of osteoporosis. Eat foods that are rich in calcium and vitamin D, and do weight-bearing exercises several times each week as directed by your health care provider. What should I know about how menopause affects my mental health? Depression may occur at any age, but it is more common as you become older. Common symptoms of depression include:  Low or sad mood.  Changes in sleep patterns.  Changes in appetite or eating patterns.  Feeling an overall lack of motivation or enjoyment of activities that you previously enjoyed.  Frequent crying spells.  Talk with your health care provider if you think that you are experiencing depression. What should I know about immunizations? It is important that you get and maintain your immunizations. These include:  Tetanus, diphtheria, and pertussis (Tdap) booster vaccine.  Influenza every year before the flu season begins.  Pneumonia vaccine.  Shingles vaccine.  Your health care provider may also recommend other immunizations. This information is not intended to replace advice given to you by your health care provider. Make sure you discuss any questions you have with your health care provider. Document Released: 04/06/2005 Document Revised: 09/02/2015 Document Reviewed: 11/16/2014 Elsevier Interactive Patient Education  2018 [employer].

## 2017-11-13 ENCOUNTER — Encounter: Payer: Self-pay | Admitting: Physical Therapy

## 2017-11-13 ENCOUNTER — Ambulatory Visit (INDEPENDENT_AMBULATORY_CARE_PROVIDER_SITE_OTHER): Payer: Medicare Other | Admitting: Physical Therapy

## 2017-11-13 DIAGNOSIS — M6281 Muscle weakness (generalized): Secondary | ICD-10-CM | POA: Diagnosis not present

## 2017-11-13 DIAGNOSIS — M545 Low back pain: Secondary | ICD-10-CM | POA: Diagnosis not present

## 2017-11-13 DIAGNOSIS — G8929 Other chronic pain: Secondary | ICD-10-CM

## 2017-11-13 DIAGNOSIS — M25562 Pain in left knee: Secondary | ICD-10-CM | POA: Diagnosis not present

## 2017-11-13 NOTE — Therapy (Signed)
[facility] [address] [place], [place], [address] Phone: [phone]   Fax:  [phone]  Physical Therapy Evaluation  Patient Details  Name: [name] MRN: [id number] Date of Birth: 02/13/1946 Referring Provider: Monica Becton, MD   Encounter Date: 11/13/2017  PT End of Session - 11/13/17 1018    Visit Number  1    Number of Visits  12    Date for PT Re-Evaluation  12/25/17    Authorization Type  [employer] Medicare    PT Start Time  0932    PT Stop Time  1012    PT Time Calculation (min)  40 min    Activity Tolerance  Patient tolerated treatment well    Behavior During Therapy  [facility] for tasks assessed/performed       Past Medical History:  Diagnosis Date  . Diverticulosis   . Hiatal hernia   . Hyperlipidemia    borderline  . Hypertension     Past Surgical History:  Procedure Laterality Date  . ABDOMINAL HYSTERECTOMY     TAH Pt. unsure if BSO  . BREAST REDUCTION SURGERY  1994  . BREAST SURGERY     Reduction  . LIPOSUCTION    . REDUCTION MAMMAPLASTY Bilateral 1994    There were no vitals filed for this visit.   Subjective Assessment - 11/13/17 0933    Subjective  Pt is a 72 y/o female who presents to OPPT for LBP and spinal stenosis, as well as Lt knee pain due to OA.  Pt reports difficulty with walking, rising from chair, and lower body dressing while standing.      Limitations  Walking    How long can you walk comfortably?  20 min    Patient Stated Goals  improve pain, more flexibility/strength in LLE, walk further    Currently in Pain?  Yes    Pain Score  0-No pain   up to 3/10   Pain Location  Back    Pain Orientation  Left;Lower    Pain Descriptors / Indicators  Dull;Aching    Pain Type  Chronic pain    Pain Onset  More than a month ago   2 years   Pain Frequency  Intermittent    Aggravating Factors   walking, rising from chair    Pain Relieving Factors  medication, rest     Pain Score  0   up to 5/10   Pain Location  Knee    Pain Orientation  Left    Pain Descriptors / Indicators  Aching    Pain Type  Chronic pain    Pain Onset  More than a month ago   couple years   Pain Frequency  Intermittent    Aggravating Factors   bending knee, walking    Pain Relieving Factors  arthritis cream, medication         [facility] PT Assessment - 11/13/17 0939      Assessment   Medical Diagnosis  M51.36 (ICD-10-CM) - Lumbar degenerative disc disease; Lt knee arthritis    Referring Provider  Monica Becton, MD    Onset Date/Surgical Date  --   2 years ago   Next MD Visit  PRN    Prior Therapy  at this clinic; many years ago      Precautions   Precautions  None      Restrictions   Weight Bearing Restrictions  No      Balance Screen  Has the patient fallen in the past 6 months  No    Has the patient had a decrease in activity level because of a fear of falling?   No    Is the patient reluctant to leave their home because of a fear of falling?   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 [employer]    Leisure  church, go out to eat, spend time with family, no regular exercise      Cognition   Overall Cognitive Status  Within Functional Limits for tasks assessed      Observation/Other Assessments   Focus on Therapeutic Outcomes (FOTO)   56 (44% limited; predicted 37% limited)      Posture/Postural Control    Posture/Postural Control  Postural limitations    Postural Limitations  Rounded Shoulders;Forward head;Decreased lumbar lordosis      ROM / Strength   AROM / PROM / Strength  AROM;Strength      AROM   Overall AROM Comments  all lumbar motions limited ~ 25%; only pain with side bending    AROM Assessment Site  Knee    Right/Left Knee  Left;Right    Right Knee Extension  0    Right Knee Flexion  114    Left Knee Extension  0    Left Knee Flexion  99      Strength   Overall Strength Comments  pain with knee flexion/extension testing    Strength Assessment Site  Hip;Knee;Ankle    Right/Left Hip  Right;Left    Right Hip Flexion  5/5    Right Hip Extension  3/5    Right Hip ABduction  4/5    Left Hip Flexion  3-/5    Left Hip Extension  3-/5    Left Hip ABduction  4/5    Right/Left Knee  Right;Left    Right Knee Flexion  5/5    Right Knee Extension  5/5    Left Knee Flexion  4/5    Left Knee Extension  4/5    Right/Left Ankle  Right;Left    Right Ankle Dorsiflexion  5/5    Left Ankle Dorsiflexion  5/5      Flexibility   Soft Tissue Assessment /Muscle Length  yes    Hamstrings  tightness bil    Piriformis  tightness bil      Palpation   Spinal mobility  hypomobility lumbar CPA    Palpation comment  tenderness Lt SIJ and glutes      Transfers   Comments  heavy reliance on UEs to stand                Objective measurements completed on examination: See above findings.      [facility]/Exercise - 11/13/17 0939      Exercises  Exercises  Lumbar      Lumbar Exercises: Stretches   Single Knee to Chest Stretch  Right;Left;1 rep;30 seconds    Lower Trunk Rotation  1 rep;30 seconds    Piriformis Stretch  Right;Left;30 seconds;1 rep             PT Education - 11/13/17 1018    Education Details  HEP    Person(s) Educated  Patient    Methods  Explanation;Demonstration;Handout    Comprehension  Verbalized understanding;Returned  demonstration;Need further instruction          PT Long Term Goals - 11/13/17 1046      PT LONG TERM GOAL #1   Title  independent with HEP    Status  New    Target Date  12/25/17      PT LONG TERM GOAL #2   Title  FOTO score improved to </= 40% limitation for improved function    Status  New    Target Date  12/25/17      PT LONG TERM GOAL #3   Title  demonstrate improved functional strength by demonstrating ability to stand 2 out of 3 trials without UE support from chair    Status  New    Target Date  12/25/17      PT LONG TERM GOAL #4   Title  report ability to walk > 30 min without increase in pain for improved function    Status  New    Target Date  12/25/17      PT LONG TERM GOAL #5   Title  improve Lt knee active flexion to at least 105 degrees for improved motion and function    Status  New    Target Date  12/25/17             Plan - 11/13/17 1043    Clinical Impression Statement  Pt is a 72 y/o female who presents to OPPT for low back pain and Lt knee pain.  Pt with known spinal stenosis and Lt knee OA contributing to pain.  Pt demonstrates decreased ROM, strength and flexibility affecting functional mobility.  Pt will benefit from PT to address deficits listed.    History and Personal Factors relevant to plan of care:  OA, HTN    Clinical Presentation  Stable    Clinical Decision Making  Low    Rehab Potential  Good    PT Frequency  2x / week    PT Duration  6 weeks    PT Treatment/Interventions  ADLs/Self Care Home Management;Cryotherapy;Electrical Stimulation;Ultrasound;Traction;Moist Heat;Gait training;Stair training;Functional mobility training;Therapeutic activities;Therapeutic exercise;Neuromuscular re-education;Patient/family education;Taping;Dry needling    PT Next Visit Plan  review stretches, add core/hip strengthening, modalities PRN for pain    PT Home Exercise Plan  Access Code: [email or web address]    Consulted and Agree with Plan of Care  Patient        Patient will benefit from skilled therapeutic intervention in order to improve the following deficits and impairments:  Pain, Postural dysfunction, Increased muscle spasms, Increased fascial restricitons, Abnormal gait, Decreased mobility, Decreased range of motion, Decreased strength, Hypomobility, Impaired flexibility  Visit Diagnosis: Chronic midline low back pain without sciatica - Plan: PT plan of care cert/re-cert  Chronic pain of left knee - Plan: PT plan of care cert/re-cert  Muscle weakness (generalized) - Plan: PT plan of care cert/re-cert     Problem List Patient Active Problem List   Diagnosis Date Noted  . Spinal stenosis of lumbar region  with neurogenic claudication 05/05/2016  . Chronic kidney disease (CKD) stage G3b/A1, moderately decreased glomerular filtration rate (GFR) between 30-44 mL/min/1.73 square meter and albuminuria creatinine ratio less than 30 mg/g (HCC) 06/11/2014  . IFG (impaired fasting glucose) 06/11/2014  . Left shoulder pain 07/27/2013  . Primary osteoarthritis of both knees 07/27/2013  . Lumbar degenerative disc disease 04/07/2013  . Pelvic kidney 07/30/2012  . Hyperlipidemia 01/06/2008  . HYPERTENSION, BENIGN 01/06/2008      Clarita Crane, PT, DPT 11/13/17 10:51 AM     [facility] [address] [place], [place], [address] Phone: [phone]   Fax:  [phone]  Name: [name] MRN: [id number] Date of Birth: 08-05-1945

## 2017-11-13 NOTE — Patient Instructions (Signed)
Access Code: [email or web address]  URL: [email or web address]  Date: 11/13/2017  Prepared by: Moshe CiproStephanie Klyde Banka   Exercises  Supine Piriformis Stretch with Foot on Ground - 3 reps - 1 sets - 30 sec hold - 2x daily - 7x weekly  Supine Single Knee to Chest Stretch - 3 reps - 1 sets - 30 sec hold - 2x daily - 7x weekly  Supine Lower Trunk Rotation - 3 reps - 1 sets - 30 sec hold - 2x daily - 7x weekly

## 2017-11-19 ENCOUNTER — Encounter: Payer: Medicare Other | Admitting: Physical Therapy

## 2017-11-21 ENCOUNTER — Ambulatory Visit (INDEPENDENT_AMBULATORY_CARE_PROVIDER_SITE_OTHER): Payer: Medicare Other | Admitting: Physical Therapy

## 2017-11-21 DIAGNOSIS — M25562 Pain in left knee: Secondary | ICD-10-CM

## 2017-11-21 DIAGNOSIS — M545 Low back pain: Secondary | ICD-10-CM | POA: Diagnosis not present

## 2017-11-21 DIAGNOSIS — G8929 Other chronic pain: Secondary | ICD-10-CM

## 2017-11-21 DIAGNOSIS — M6281 Muscle weakness (generalized): Secondary | ICD-10-CM | POA: Diagnosis not present

## 2017-11-21 NOTE — Therapy (Signed)
[facility] [place] [address] [place], [place], [address] Phone: [phone]   Fax:  [phone]  Physical Therapy Treatment  Patient Details  Name: [name] MRN: [id number] Date of Birth: 1945-09-29 Referring Provider: Monica Becton, MD   Encounter Date: 11/21/2017  PT End of Session - 11/21/17 1152    Visit Number  2    Number of Visits  12    Date for PT Re-Evaluation  12/25/17    Authorization Type  UHC Medicare    PT Start Time  1107    PT Stop Time  1149    PT Time Calculation (min)  42 min    Activity Tolerance  Patient tolerated treatment well    Behavior During Therapy  [facility] for tasks assessed/performed       Past Medical History:  Diagnosis Date  . Diverticulosis   . Hiatal hernia   . Hyperlipidemia    borderline  . Hypertension     Past Surgical History:  Procedure Laterality Date  . ABDOMINAL HYSTERECTOMY     TAH Pt. unsure if BSO  . BREAST REDUCTION SURGERY  1994  . BREAST SURGERY     Reduction  . LIPOSUCTION    . REDUCTION MAMMAPLASTY Bilateral 1994    There were no vitals filed for this visit.  Subjective Assessment - 11/21/17 1112    Subjective  Pt reports she doesn't notice a difference from last week. She has done her HEP stretches every day.  "I still feel weak in my legs and have trouble with the stairs"     Patient Stated Goals  improve pain, more flexibility/strength in LLE, walk further    Currently in Pain?  No/denies    Pain Score  0-No pain       [facility] Adult PT Treatment/Exercise - 11/21/17 0001      Exercises   Exercises  Knee/Hip;Lumbar      Lumbar Exercises: Stretches   Single Knee to Chest Stretch  Right;Left;2 reps;30 seconds   opposite knee bent   Lower Trunk Rotation  1 rep;20 seconds    Quad Stretch  Left;3 reps;Right;2 reps;30 seconds   foot under seat   Piriformis Stretch  Right;Left;2 reps;30 seconds    Gastroc Stretch  Right;Left;3 reps;20  seconds   2 runners stretch, 1 with heel off step     Lumbar Exercises: Aerobic   Nustep  L4: 5 min (limited range due to Lt knee tightness)    PTA present to monitor and discuss progress     Lumbar Exercises: Seated   Sit to Stand  5 reps;Limitations    Sit to Stand Limitations  elevated table, no UE support, cues for sequence.  3 additional reps after rest.       Lumbar Exercises: Supine   Ab Set  5 reps;5 seconds    Other Supine Lumbar Exercises  pt instructed in supine              PT Education - 11/21/17 1207    Education Details  updated HEP.  also added quad stretch in seated position    Person(s) Educated  Patient    Methods  Explanation;Handout;Verbal cues;Demonstration    Comprehension  Verbalized understanding;Returned demonstration          PT Long Term Goals - 11/13/17 1046      PT LONG TERM GOAL #1   Title  independent with HEP    Status  New  Target Date  12/25/17      PT LONG TERM GOAL #2   Title  FOTO score improved to </= 40% limitation for improved function    Status  New    Target Date  12/25/17      PT LONG TERM GOAL #3   Title  demonstrate improved functional strength by demonstrating ability to stand 2 out of 3 trials without UE support from chair    Status  New    Target Date  12/25/17      PT LONG TERM GOAL #4   Title  report ability to walk > 30 min without increase in pain for improved function    Status  New    Target Date  12/25/17      PT LONG TERM GOAL #5   Title  improve Lt knee active flexion to at least 105 degrees for improved motion and function    Status  New    Target Date  12/25/17            Plan - 11/21/17 1204    Clinical Impression Statement  Pt presents with limited Lt knee flexion; added seated quad stretch to address tightness.  Pt tolerated sit to/from stand from elevated surface without support and encouragement.  Pt tolerated all exercises well, without report of increased pain in back or knees.   Pt progressing towards goals.     Rehab Potential  Good    PT Frequency  2x / week    PT Duration  6 weeks    PT Treatment/Interventions  ADLs/Self Care Home Management;Cryotherapy;Electrical Stimulation;Ultrasound;Traction;Moist Heat;Gait training;Stair training;Functional mobility training;Therapeutic activities;Therapeutic exercise;Neuromuscular re-education;Patient/family education;Taping;Dry needling    PT Next Visit Plan  progress core exercises; add hamstring stretch; trial of stairs for exercise.     PT Home Exercise Plan  Access Code: [email or web address]    Consulted and Agree with Plan of Care  Patient       Patient will benefit from skilled therapeutic intervention in order to improve the following deficits and impairments:  Pain, Postural dysfunction, Increased muscle spasms, Increased fascial restricitons, Abnormal gait, Decreased mobility, Decreased range of motion, Decreased strength, Hypomobility, Impaired flexibility  Visit Diagnosis: Chronic midline low back pain without sciatica  Chronic pain of left knee  Muscle weakness (generalized)     Problem List Patient Active Problem List   Diagnosis Date Noted  . Spinal stenosis of lumbar region with neurogenic claudication 05/05/2016  . Chronic kidney disease (CKD) stage G3b/A1, moderately decreased glomerular filtration rate (GFR) between 30-44 mL/min/1.73 square meter and albuminuria creatinine ratio less than 30 mg/g (HCC) 06/11/2014  . IFG (impaired fasting glucose) 06/11/2014  . Left shoulder pain 07/27/2013  . Primary osteoarthritis of both knees 07/27/2013  . Lumbar degenerative disc disease 04/07/2013  . Pelvic kidney 07/30/2012  . Hyperlipidemia 01/06/2008  . HYPERTENSION, BENIGN 01/06/2008   Mayer Camel, PTA 11/21/17 12:08 PM  [facility] [place] [address] [place] [address] [place], [place], [address] Phone: [phone]   Fax:  [phone]  Name: [name] MRN: [id number] Date of Birth: [birth year]

## 2017-11-26 ENCOUNTER — Encounter: Payer: Self-pay | Admitting: Physical Therapy

## 2017-11-26 ENCOUNTER — Encounter: Payer: Self-pay | Admitting: Family Medicine

## 2017-11-26 ENCOUNTER — Ambulatory Visit (INDEPENDENT_AMBULATORY_CARE_PROVIDER_SITE_OTHER): Payer: Medicare Other | Admitting: Physical Therapy

## 2017-11-26 ENCOUNTER — Ambulatory Visit (INDEPENDENT_AMBULATORY_CARE_PROVIDER_SITE_OTHER): Payer: Medicare Other | Admitting: Family Medicine

## 2017-11-26 VITALS — BP 136/78 | HR 79 | Ht 69.0 in | Wt 225.0 lb

## 2017-11-26 DIAGNOSIS — M48062 Spinal stenosis, lumbar region with neurogenic claudication: Secondary | ICD-10-CM

## 2017-11-26 DIAGNOSIS — M25562 Pain in left knee: Secondary | ICD-10-CM | POA: Diagnosis not present

## 2017-11-26 DIAGNOSIS — N183 Chronic kidney disease, stage 3 (moderate): Secondary | ICD-10-CM | POA: Diagnosis not present

## 2017-11-26 DIAGNOSIS — G8929 Other chronic pain: Secondary | ICD-10-CM

## 2017-11-26 DIAGNOSIS — R351 Nocturia: Secondary | ICD-10-CM

## 2017-11-26 DIAGNOSIS — I10 Essential (primary) hypertension: Secondary | ICD-10-CM | POA: Diagnosis not present

## 2017-11-26 DIAGNOSIS — M6281 Muscle weakness (generalized): Secondary | ICD-10-CM | POA: Diagnosis not present

## 2017-11-26 DIAGNOSIS — Z23 Encounter for immunization: Secondary | ICD-10-CM | POA: Diagnosis not present

## 2017-11-26 DIAGNOSIS — N1832 Chronic kidney disease, stage 3b: Secondary | ICD-10-CM

## 2017-11-26 DIAGNOSIS — R7301 Impaired fasting glucose: Secondary | ICD-10-CM | POA: Diagnosis not present

## 2017-11-26 DIAGNOSIS — M545 Low back pain: Secondary | ICD-10-CM

## 2017-11-26 LAB — POCT URINALYSIS DIPSTICK
Blood, UA: NEGATIVE
GLUCOSE UA: NEGATIVE
Leukocytes, UA: NEGATIVE
Nitrite, UA: NEGATIVE
PROTEIN UA: POSITIVE — AB
Spec Grav, UA: 1.025 (ref 1.010–1.025)
Urobilinogen, UA: 0.2 E.U./dL
pH, UA: 5.5 (ref 5.0–8.0)

## 2017-11-26 LAB — POCT GLYCOSYLATED HEMOGLOBIN (HGB A1C): HEMOGLOBIN A1C: 6 % — AB (ref 4.0–5.6)

## 2017-11-26 NOTE — Therapy (Signed)
[facility] [place] [address] [place] [address] [place], [place], [address] Phone: [phone]   Fax:  [phone]  Physical Therapy Treatment  Patient Details  Name: [name] MRN: [id number] Date of Birth: 03/19/1945 Referring Provider (PT): Monica Becton, MD   Encounter Date: 11/26/2017  PT End of Session - 11/26/17 1243    Visit Number  3    Number of Visits  12    Date for PT Re-Evaluation  12/25/17    Authorization Type  UHC Medicare    PT Start Time  1200    PT Stop Time  1242    PT Time Calculation (min)  42 min    Activity Tolerance  Patient tolerated treatment well    Behavior During Therapy  [facility] for tasks assessed/performed       Past Medical History:  Diagnosis Date  . Diverticulosis   . Hiatal hernia   . Hyperlipidemia    borderline  . Hypertension     Past Surgical History:  Procedure Laterality Date  . ABDOMINAL HYSTERECTOMY     TAH Pt. unsure if BSO  . BREAST REDUCTION SURGERY  1994  . BREAST SURGERY     Reduction  . LIPOSUCTION    . REDUCTION MAMMAPLASTY Bilateral 1994    There were no vitals filed for this visit.  Subjective Assessment - 11/26/17 1202    Subjective  had a hard time Saturday because legs were hurting her (not sure what caused pain).      Patient Stated Goals  improve pain, more flexibility/strength in LLE, walk further    Currently in Pain?  No/denies                       [facility] Adult PT Treatment/Exercise - 11/26/17 1206      Exercises   Exercises  Knee/Hip;Lumbar      Lumbar Exercises: Stretches   Passive Hamstring Stretch  Right;Left;2 reps;30 seconds    Passive Hamstring Stretch Limitations  seated      Lumbar Exercises: Aerobic   Nustep  L5 x 5 min      Lumbar Exercises: Supine   Ab Set  10 reps;5 seconds    Bridge Limitations  attempted; unable due to hamstrings cramping      Knee/Hip Exercises: Standing   Heel Raises  20 reps    Hip  Abduction  Both;10 reps;Knee straight    Hip Extension  Both;10 reps;Knee straight      Knee/Hip Exercises: Seated   Long Arc Quad  Both;10 reps    Long Arc Quad Weight  2 lbs.    Ball Squeeze  10 x 5 sec    Clamshell with TheraBand  Green   x 10 reps   Marching  Both;10 reps;Weights    Marching Weights  2 lbs.    Hamstring Curl  Both;10 reps    Hamstring Limitations  green theraband    Sit to Sand  10 reps;without UE support   from elevated mat table                 PT Long Term Goals - 11/13/17 1046      PT LONG TERM GOAL #1   Title  independent with HEP    Status  New    Target Date  12/25/17      PT LONG TERM GOAL #2   Title  FOTO score improved to </= 40% limitation for improved function  Status  New    Target Date  12/25/17      PT LONG TERM GOAL #3   Title  demonstrate improved functional strength by demonstrating ability to stand 2 out of 3 trials without UE support from chair    Status  New    Target Date  12/25/17      PT LONG TERM GOAL #4   Title  report ability to walk > 30 min without increase in pain for improved function    Status  New    Target Date  12/25/17      PT LONG TERM GOAL #5   Title  improve Lt knee active flexion to at least 105 degrees for improved motion and function    Status  New    Target Date  12/25/17            Plan - 11/26/17 1243    Clinical Impression Statement  Pt reports increased pain in legs over the weekend but pain resolved today.  Pt unable to recall cause of increased pain and symptoms today.  Tolerated session well today, but continues to demonstrate lower extremity weakness affecting functional mobility.  Will continue to benefit from PT to maximize function.  Progressing towards goals.    Rehab Potential  Good    PT Frequency  2x / week    PT Duration  6 weeks    PT Treatment/Interventions  ADLs/Self Care Home Management;Cryotherapy;Electrical Stimulation;Ultrasound;Traction;Moist Heat;Gait  training;Stair training;Functional mobility training;Therapeutic activities;Therapeutic exercise;Neuromuscular re-education;Patient/family education;Taping;Dry needling    PT Next Visit Plan  progress core exercises; add hamstring stretch; trial of stairs for exercise.     PT Home Exercise Plan  Access Code: [email or web address]    Consulted and Agree with Plan of Care  Patient       Patient will benefit from skilled therapeutic intervention in order to improve the following deficits and impairments:  Pain, Postural dysfunction, Increased muscle spasms, Increased fascial restricitons, Abnormal gait, Decreased mobility, Decreased range of motion, Decreased strength, Hypomobility, Impaired flexibility  Visit Diagnosis: Chronic midline low back pain without sciatica  Chronic pain of left knee  Muscle weakness (generalized)     Problem List Patient Active Problem List   Diagnosis Date Noted  . Spinal stenosis of lumbar region with neurogenic claudication 05/05/2016  . Chronic kidney disease (CKD) stage G3b/A1, moderately decreased glomerular filtration rate (GFR) between 30-44 mL/min/1.73 square meter and albuminuria creatinine ratio less than 30 mg/g (HCC) 06/11/2014  . IFG (impaired fasting glucose) 06/11/2014  . Left shoulder pain 07/27/2013  . Primary osteoarthritis of both knees 07/27/2013  . Lumbar degenerative disc disease 04/07/2013  . Pelvic kidney 07/30/2012  . Hyperlipidemia 01/06/2008  . HYPERTENSION, BENIGN 01/06/2008      Clarita Crane, PT, DPT 11/26/17 12:50 PM   [facility] [address] [place], [place], [address] Phone: [phone]   Fax:  [phone]  Name: [name] MRN: [id number] Date of Birth: Aug 21, 1945

## 2017-11-26 NOTE — Progress Notes (Signed)
Subjective:    CC: IFG, BP  HPI:  Hypertension- Pt denies chest pain, SOB, dizziness, or heart palpitations.  Taking meds as directed w/o problems.  Denies medication side effects.    Impaired fasting glucose-no increased thirst or urination. No symptoms consistent with hypoglycemia.  He also complains of excess urination at night.  She says sometimes she will get up between 5-6 times at night but she also does drink a lot of water during the day.  No dysuria or hematuria. She takes her diuretic in the morning.    F/U CKD 3 - due to recheck labs.   Doing physical therapy to work on strengthening her lower legs.  Spinal stenosis has caused some increasing weakness and difficulties.  Occasionally she will take an Aleve but she really tries to stay away from painkillers most of the time.   Past medical history, Surgical history, Family history not pertinant except as noted below, Social history, Allergies, and medications have been entered into the medical record, reviewed, and corrections made.   Review of Systems: No fevers, chills, night sweats, weight loss, chest pain, or shortness of breath.   Objective:    General: Well Developed, well nourished, and in no acute distress.  Neuro: Alert and oriented x3, extra-ocular muscles intact, sensation grossly intact.  HEENT: Normocephalic, atraumatic  Skin: Warm and dry, no rashes. Cardiac: Regular rate and rhythm, no murmurs rubs or gallops, no lower extremity edema.  Respiratory: Clear to auscultation bilaterally. Not using accessory muscles, speaking in full sentences.   Impression and Recommendations:    HTN - Well controlled. Continue current regimen. Follow up in  6 months.    IFG - A1C of 6.0.  Well controlled. Continue current regimen. Follow up in 6 months.    CKD 3 -   Due for follow up to recheck.    Urinary frequency especially at night/nocturia-we will check her urine today.  If normal then will have her try to cut back on  her fluids within a couple hours of bedtime if that still not helping and she still going frequently then we may consider an overactive bladder medication.    Spinal Stenosis - Doing PT.      Flu vaccine given today.

## 2017-11-28 ENCOUNTER — Encounter: Payer: Self-pay | Admitting: Rehabilitative and Restorative Service Providers"

## 2017-11-28 ENCOUNTER — Ambulatory Visit (INDEPENDENT_AMBULATORY_CARE_PROVIDER_SITE_OTHER): Payer: Medicare Other | Admitting: Rehabilitative and Restorative Service Providers"

## 2017-11-28 DIAGNOSIS — R7301 Impaired fasting glucose: Secondary | ICD-10-CM | POA: Diagnosis not present

## 2017-11-28 DIAGNOSIS — I10 Essential (primary) hypertension: Secondary | ICD-10-CM | POA: Diagnosis not present

## 2017-11-28 DIAGNOSIS — G8929 Other chronic pain: Secondary | ICD-10-CM | POA: Diagnosis not present

## 2017-11-28 DIAGNOSIS — M545 Low back pain: Secondary | ICD-10-CM

## 2017-11-28 DIAGNOSIS — M25562 Pain in left knee: Secondary | ICD-10-CM

## 2017-11-28 DIAGNOSIS — M6281 Muscle weakness (generalized): Secondary | ICD-10-CM

## 2017-11-28 DIAGNOSIS — N183 Chronic kidney disease, stage 3 (moderate): Secondary | ICD-10-CM | POA: Diagnosis not present

## 2017-11-28 NOTE — Patient Instructions (Addendum)
   Strengthening: Straight Leg Raise (Phase 1)    Tighten muscles on front of right thigh, then lift leg _10-12___ inches from surface, keeping knee locked. 3-5 sec hold  Repeat __10__ times per set. Do _1-3___ sets per session. Do _1___ sessions per day.    Strengthening: Hip Adduction - Isometric   5 With ball or folded pillow between knees, squeeze knees together. Hold ____ seconds. Repeat __10 times per set. Do __1-2__ sets per session. Do __1_ sessions per day.      Strengthening: Hip Abductor - Resisted    With band looped around both legs above knees, hold one knee still move other  Repeat __10__ times per set. Do __1-3__ sets per session. Do _1___ sessions per day.   Forward    Facing step, place one leg on step, flexed at hip. Step up slowly, bringing hips in line with knee and shoulder. Bring other foot onto step. Reverse process to step back down. Repeat with other leg. Do __10__ repetitions, __1-2__ sets.   Walking side steps; heel to toe; backwards walking along the counter     Several reps each direction Balance: Unilateral holding to counter for balance     Attempt to balance on left leg, eyes open. Hold _20-30___ seconds. Repeat __3-4__ times per set. Do ___1-2_ sets per session. Do _1-2___ sessions per day. Perform exercise with eyes closed.

## 2017-11-28 NOTE — Therapy (Addendum)
[place], [place], [address] Phone: [phone]   Fax:  [phone]  Physical Therapy Treatment/Discharge  Patient Details  Name: [name] MRN: [id number] Date of Birth: 1945/10/16 Referring Provider (PT): Silverio Decamp, MD   Encounter Date: 11/28/2017  PT End of Session - 11/28/17 1015    Visit Number  4    Number of Visits  12    Date for PT Re-Evaluation  12/25/17    Authorization Type  UHC Medicare    PT Start Time  1014    PT Stop Time  1100    PT Time Calculation (min)  46 min    Activity Tolerance  Patient tolerated treatment well       Past Medical History:  Diagnosis Date  . Diverticulosis   . Hiatal hernia   . Hyperlipidemia    borderline  . Hypertension     Past Surgical History:  Procedure Laterality Date  . ABDOMINAL HYSTERECTOMY     TAH Pt. unsure if BSO  . BREAST REDUCTION SURGERY  1994  . BREAST SURGERY     Reduction  . LIPOSUCTION    . REDUCTION MAMMAPLASTY Bilateral 1994    There were no vitals filed for this visit.  Subjective Assessment - 11/28/17 1016    Subjective  No pain in back or legs today. She is working on her exercises at home. Has exercised in the water in the past and will try to go back again.     Currently in Pain?  No/denies                       [facility] Adult PT Treatment/Exercise - 11/28/17 0001      Lumbar Exercises: Stretches   Passive Hamstring Stretch  Right;Left;2 reps;30 seconds   supine with strap      Lumbar Exercises: Aerobic   Nustep  L5 x 5 min      Lumbar Exercises: Seated   Sit to Stand  10 reps    Sit to Stand Limitations  elevated table, no UE support, verbal cues       Lumbar Exercises: Supine   Clam  10 reps;2 seconds    Clam Limitations  holding one knee still moving opposite into abduction alternating LE's     Bridge  10 reps;5 seconds    Straight Leg Raise  10 reps;3 seconds    opposite knee bent      Knee/Hip Exercises: Standing   Heel Raises  20 reps    Hip Abduction  Both;10 reps;Knee straight    Hip Extension  Both;10 reps;Knee straight    Forward Step Up  Right;Left;2 sets;10 reps;Hand Hold: 2;Step Height: 4"    Functional Squat  1 set;10 reps;3 seconds   dropping hips back - UE support on stair rail    SLS  Rt/Lt 20 sec hold hand hold assist as needed for safety and balance     Other Standing Knee Exercises  side steps Rt/Lt x 12 feet x 4 reps each direction; heel to toe x 12 feet x 3 reps; backwards walking x 12 ft x 3 reps       Knee/Hip Exercises: Seated   Ball Squeeze  10 x 5 sec    Clamshell with TheraBand  Green   x 10 reps slow eccentric             PT Education - 11/28/17 1124  [place], [place], [address] Phone: [phone]   Fax:  [phone]  Physical Therapy Treatment/Discharge  Patient Details  Name: [name] MRN: [id number] Date of Birth: 1945/10/16 Referring Provider (PT): Silverio Decamp, MD   Encounter Date: 11/28/2017  PT End of Session - 11/28/17 1015    Visit Number  4    Number of Visits  12    Date for PT Re-Evaluation  12/25/17    Authorization Type  UHC Medicare    PT Start Time  1014    PT Stop Time  1100    PT Time Calculation (min)  46 min    Activity Tolerance  Patient tolerated treatment well       Past Medical History:  Diagnosis Date  . Diverticulosis   . Hiatal hernia   . Hyperlipidemia    borderline  . Hypertension     Past Surgical History:  Procedure Laterality Date  . ABDOMINAL HYSTERECTOMY     TAH Pt. unsure if BSO  . BREAST REDUCTION SURGERY  1994  . BREAST SURGERY     Reduction  . LIPOSUCTION    . REDUCTION MAMMAPLASTY Bilateral 1994    There were no vitals filed for this visit.  Subjective Assessment - 11/28/17 1016    Subjective  No pain in back or legs today. She is working on her exercises at home. Has exercised in the water in the past and will try to go back again.     Currently in Pain?  No/denies                       [facility] Adult PT Treatment/Exercise - 11/28/17 0001      Lumbar Exercises: Stretches   Passive Hamstring Stretch  Right;Left;2 reps;30 seconds   supine with strap      Lumbar Exercises: Aerobic   Nustep  L5 x 5 min      Lumbar Exercises: Seated   Sit to Stand  10 reps    Sit to Stand Limitations  elevated table, no UE support, verbal cues       Lumbar Exercises: Supine   Clam  10 reps;2 seconds    Clam Limitations  holding one knee still moving opposite into abduction alternating LE's     Bridge  10 reps;5 seconds    Straight Leg Raise  10 reps;3 seconds    opposite knee bent      Knee/Hip Exercises: Standing   Heel Raises  20 reps    Hip Abduction  Both;10 reps;Knee straight    Hip Extension  Both;10 reps;Knee straight    Forward Step Up  Right;Left;2 sets;10 reps;Hand Hold: 2;Step Height: 4"    Functional Squat  1 set;10 reps;3 seconds   dropping hips back - UE support on stair rail    SLS  Rt/Lt 20 sec hold hand hold assist as needed for safety and balance     Other Standing Knee Exercises  side steps Rt/Lt x 12 feet x 4 reps each direction; heel to toe x 12 feet x 3 reps; backwards walking x 12 ft x 3 reps       Knee/Hip Exercises: Seated   Ball Squeeze  10 x 5 sec    Clamshell with TheraBand  Green   x 10 reps slow eccentric             PT Education - 11/28/17 1124  Education Details  HEP     Person(s) Educated  Patient    Methods  Explanation;Demonstration;Tactile cues;Verbal cues;Handout    Comprehension  Verbalized understanding;Returned demonstration;Verbal cues required;Tactile cues required          PT Long Term Goals - 11/13/17 1046      PT LONG TERM GOAL #1   Title  independent with HEP    Status  New    Target Date  12/25/17      PT LONG TERM GOAL #2   Title  FOTO score improved to </= 40% limitation for improved function    Status  New    Target Date  12/25/17      PT LONG TERM GOAL #3   Title  demonstrate improved functional strength by demonstrating ability to stand 2 out of 3 trials without UE support from chair    Status  New    Target Date  12/25/17      PT LONG TERM GOAL #4   Title  report ability to walk > 30 min without increase in pain for improved function    Status  New    Target Date  12/25/17      PT LONG TERM GOAL #5   Title  improve Lt knee active flexion to at least 105 degrees for improved motion and function    Status  New    Target Date  12/25/17            Plan - 11/28/17 1016    Clinical Impression Statement  Patient added exercises for LE and core  strengthening without difficulty. Patient continues to progress gradually toward stated goals of therapy. Encouraged patient to return to water exercise as schedule allows.     Rehab Potential  Good    PT Frequency  2x / week    PT Duration  6 weeks    PT Treatment/Interventions  ADLs/Self Care Home Management;Cryotherapy;Electrical Stimulation;Ultrasound;Traction;Moist Heat;Gait training;Stair training;Functional mobility training;Therapeutic activities;Therapeutic exercise;Neuromuscular re-education;Patient/family education;Taping;Dry needling    PT Next Visit Plan  progress core exercises/LE strengthening     PT Home Exercise Plan  Access Code: [email or web address]    Consulted and Agree with Plan of Care  Patient       Patient will benefit from skilled therapeutic intervention in order to improve the following deficits and impairments:  Pain, Postural dysfunction, Increased muscle spasms, Increased fascial restricitons, Abnormal gait, Decreased mobility, Decreased range of motion, Decreased strength, Hypomobility, Impaired flexibility  Visit Diagnosis: Chronic midline low back pain without sciatica  Chronic pain of left knee  Muscle weakness (generalized)     Problem List Patient Active Problem List   Diagnosis Date Noted  . Spinal stenosis of lumbar region with neurogenic claudication 05/05/2016  . Chronic kidney disease (CKD) stage G3b/A1, moderately decreased glomerular filtration rate (GFR) between 30-44 mL/min/1.73 square meter and albuminuria creatinine ratio less than 30 mg/g (HCC) 06/11/2014  . IFG (impaired fasting glucose) 06/11/2014  . Left shoulder pain 07/27/2013  . Primary osteoarthritis of both knees 07/27/2013  . Lumbar degenerative disc disease 04/07/2013  . Pelvic kidney 07/30/2012  . Hyperlipidemia 01/06/2008  . HYPERTENSION, BENIGN 01/06/2008    [name] PT, MPH  11/28/2017, 11:24 AM  [facility] [place]  [place], [place], [address] Phone: [phone]   Fax:  [phone]  Name: [name] MRN: [id number] Date of Birth: Oct 12, 1945     PHYSICAL THERAPY DISCHARGE SUMMARY  Visits from Start of

## 2017-11-29 LAB — COMPLETE METABOLIC PANEL WITH GFR
AG Ratio: 1.7 (calc) (ref 1.0–2.5)
ALT: 17 U/L (ref 6–29)
AST: 20 U/L (ref 10–35)
Albumin: 4.5 g/dL (ref 3.6–5.1)
Alkaline phosphatase (APISO): 93 U/L (ref 33–130)
BUN/Creatinine Ratio: 16 (calc) (ref 6–22)
BUN: 15 mg/dL (ref 7–25)
CO2: 28 mmol/L (ref 20–32)
Calcium: 9.4 mg/dL (ref 8.6–10.4)
Chloride: 100 mmol/L (ref 98–110)
Creat: 0.96 mg/dL — ABNORMAL HIGH (ref 0.60–0.93)
GFR, Est African American: 68 mL/min/{1.73_m2} (ref >=60)
GFR, Est Non African American: 59 mL/min/{1.73_m2} — ABNORMAL LOW (ref >=60)
Globulin: 2.6 g/dL (calc) (ref 1.9–3.7)
Glucose, Bld: 106 mg/dL — ABNORMAL HIGH (ref 65–99)
Potassium: 4.2 mmol/L (ref 3.5–5.3)
Sodium: 141 mmol/L (ref 135–146)
Total Bilirubin: 0.6 mg/dL (ref 0.2–1.2)
Total Protein: 7.1 g/dL (ref 6.1–8.1)

## 2017-11-29 LAB — LIPID PANEL
CHOL/HDL RATIO: 4.3 (calc) (ref <5.0)
Cholesterol: 186 mg/dL (ref <200)
HDL: 43 mg/dL — ABNORMAL LOW (ref >50)
LDL CHOLESTEROL (CALC): 120 mg/dL — AB
NON-HDL CHOLESTEROL (CALC): 143 mg/dL — AB (ref <130)
TRIGLYCERIDES: 118 mg/dL (ref <150)

## 2017-12-02 ENCOUNTER — Encounter: Payer: Medicare Other | Admitting: Physical Therapy

## 2017-12-04 ENCOUNTER — Ambulatory Visit (INDEPENDENT_AMBULATORY_CARE_PROVIDER_SITE_OTHER): Payer: Medicare Other | Admitting: Family Medicine

## 2017-12-04 ENCOUNTER — Encounter: Payer: Self-pay | Admitting: Family Medicine

## 2017-12-04 VITALS — BP 163/72 | HR 82 | Ht 69.0 in | Wt 223.0 lb

## 2017-12-04 DIAGNOSIS — M170 Bilateral primary osteoarthritis of knee: Secondary | ICD-10-CM

## 2017-12-04 DIAGNOSIS — M25562 Pain in left knee: Secondary | ICD-10-CM | POA: Diagnosis not present

## 2017-12-04 DIAGNOSIS — M25462 Effusion, left knee: Secondary | ICD-10-CM | POA: Diagnosis not present

## 2017-12-04 MED ORDER — DICLOFENAC SODIUM 1 % TD GEL: 4.0000 g | Freq: Four times a day (QID) | TRANSDERMAL | 11 refills | Status: DC

## 2017-12-04 NOTE — Patient Instructions (Addendum)
Thank you for coming in today. Apply the diclofenac gel 4x daily for pain.  Use body helix full knee sleeve while up and about.  Ask your friends about knee replacement experience. I have some recommendations about surgeons to see in [place].   Recheck as needed.    We typically do not like to do injections more requently than every 3 months.    Total Knee Replacement Total knee replacement is a procedure to replace the knee joint with an artificial (prosthetic) knee joint. The purpose of this surgery is to reduce knee pain and improve knee function. The prosthetic knee joint (prosthesis) may be made of metal, plastic or ceramic. It replaces parts of the thigh bone (femur), lower leg bone (tibia), and kneecap (patella) that are removed during the procedure. Tell a health care provider about:  Any allergies you have.  All medicines you are taking, including vitamins, herbs, eye drops, creams, and over-the-counter medicines.  Any problems you or family members have had with anesthetic medicines.  Any blood disorders you have.  Any surgeries you have had.  Any medical conditions you have.  Whether you are pregnant or may be pregnant. What are the risks? Generally, this is a safe procedure. However, problems may occur, including:  Infection.  Bleeding.  Allergic reactions to medicines.  Damage to other structures or organs.  Decreased range of motion of the knee.  Instability of the knee.  Loosening of the prosthetic joint.  Knee pain that does not go away (chronic pain).  What happens before the procedure?  Ask your health care provider about: ? Changing or stopping your regular medicines. This is especially important if you are taking diabetes medicines or blood thinners. ? Taking medicines such as aspirin and ibuprofen. These medicines can thin your blood. Do not take these medicines before your procedure if your health care provider instructs you not to.  Have  dental care and routine cleanings completed before your procedure. Plan to not have dental work done for 3 months after your procedure. Germs from anywhere in your body, including your mouth, can travel to your new joint and infect it.  Follow instructions from your health care provider about eating or drinking restrictions.  Ask your health care provider how your surgical site will be marked or identified.  You may be given antibiotic medicine to help prevent infection.  If your health care provider prescribes physical therapy, do exercises as instructed.  Do not use any tobacco products, such as cigarettes, chewing tobacco, or e-cigarettes. If you need help quitting, ask your health care provider.  You may have a physical exam.  You may have tests, such as: ? X-rays. ? MRI. ? CT scan. ? Bone scans.  You may have a blood or urine sample taken.  Plan to have someone take you home after the procedure.  If you will be going home right after the procedure, plan to have someone with you for at least 24 hours. It is recommended that you have someone to help care for you for at least 4-6 weeks after your procedure. What happens during the procedure?  To reduce your risk of infection: ? Your health care team will wash or sanitize their hands. ? Your skin will be washed with soap.  An IV tube will be inserted into one of your veins.  You will be given one or more of the following: ? A medicine to help you relax (sedative). ? A medicine to numb the area (  local anesthetic). ? A medicine to make you fall asleep (general anesthetic). ? A medicine that is injected into your spine to numb the area below and slightly above the injection site (spinal anesthetic). ? A medicine that is injected into an area of your body to numb everything below the injection site (regional anesthetic).  An incision will be made in your knee.  Damaged cartilage and bone will be removed from your femur, tibia,  and patella.  Parts of the prosthesis (liners) will be placed over the areas of bone and cartilage that were removed. A metal liner will be placed over your femur, and plastic liners will be placed over your tibia and the underside of your patella.  One or more small tubes (drains) may be placed near your incision to help drain extra fluid from your surgical site.  Your incision will be closed with stitches (sutures), skin glue, or adhesive strips. Medicine may be applied to your incision.  A bandage (dressing) will be placed over your incision. The procedure may vary among health care providers and hospitals. What happens after the procedure?  Your blood pressure, heart rate, breathing rate, and blood oxygen level will be monitored often until the medicines you were given have worn off.  You may continue to receive fluids and medicines through an IV tube.  You will have some pain. Pain medicines will be available to help you.  You may have fluid coming from one or more drains in your incision.  You may have to wear compression stockings. These stockings help to prevent blood clots and reduce swelling in your legs.  You will be encouraged to move around as much as possible.  You may be given a continuous passive motion machine to use at home. You will be shown how to use this machine.  Do not drive for 24 hours if you received a sedative. This information is not intended to replace advice given to you by your health care provider. Make sure you discuss any questions you have with your health care provider. Document Released: 05/21/2000 Document Revised: 03/17/2016 Document Reviewed: 01/19/2015 Elsevier Interactive Patient Education  [employer].

## 2017-12-04 NOTE — Progress Notes (Signed)
[name] is a 72 y.o. female who presents to [facility] today for left knee pain and swelling.  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  She had aspiration and steroid injection in her left knee in August and September.  In September she had aspiration for diagnostic purposes which did not show crystals or infection.  She notes the steroid injection provided some pain relief but only lasted for a week or 2.  She notes continued left knee pain and swelling.  She has this is bothersome and does interfere with her ability to walk normally at times.    ROS:  As above  Exam:  BP (!) 163/72   Pulse 82   Ht 5\' 9"  (1.753 m)   Wt 223 lb (101.2 kg)   BMI 32.93 kg/m  General: Well Developed, well nourished, and in no acute distress.  Neuro/Psych: Alert and oriented x3, extra-ocular muscles intact, able to move all 4 extremities, sensation grossly intact. Skin: Warm and dry, no rashes noted.  Respiratory: Not using accessory muscles, speaking in full sentences, trachea midline.  Cardiovascular: Pulses palpable, no extremity edema. Abdomen: Does not appear distended. MSK:  Left knee mild effusion otherwise normal-appearing. Mildly tender to palpation medial and lateral joint line. Range of motion 0-120 degrees with retropatellar crepitations. Mild antalgic gait present   Lab and Radiology Results X-ray images knee ordered pending  EXAM: RIGHT KNEE - COMPLETE 4+ VIEW; LEFT KNEE - 1-2 VIEW  COMPARISON:  Right knee series 719 2011.  FINDINGS: Standing views of both knees, and lateral and patellar sunrise views of the right knee. Widespread joint space loss in the knees. On the right this is worse in the medial compartment, while on the left there is severe chronic degeneration of the lateral compartment. Moderate right side joint effusion, appears increased compared to that in 2011. Right side  patellofemoral chondrocalcinosis and joint space loss with osteophytosis. Underlying bone mineralization is normal. No acute fracture dislocation.  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 Medial and patellofemoral compartments most affected. Joint effusion.  On the left degenerative changes appear most severe in the lateral compartment.   Electronically Signed   By: Augusto Gamble M.D.   On: 07/27/2013 13:17  I personally (independently) visualized and performed the interpretation of the images attached in this note.   Assessment and Plan: 72 y.o. female with left knee pain and effusion.  Due to DJD.  Last x-ray was 2015 which did show advanced DJD changes then.  Plan on re-update x-ray to better characterize the degree of her arthritis.  She is got an appointment to get to we will get the x-ray a bit later and will update this note to reflect the x-ray results.   However I do not see the utility in repeating a steroid injection at this point.  She is had 2 injections in the last 3 months it will benefit.  At this point I think she is maximized her injection therapy.  Plan for trial of diclofenac gel and compressive knee sleeve as this may help a little.  However I think it is time to consider total knee replacement.  We discussed this option and she is going to talk to her friends about who they had for a total knee and give me a name for referral.    Orders Placed This Encounter  Procedures  . DG Knee  Complete 4 Views Left    Please include patellar sunrise, lateral, and weightbearing bilateral AP and bilateral rosenberg views    Standing Status:   Future    Standing Expiration Date:   02/03/2019    Order Specific Question:   Reason for exam:    Answer:   Please include patellar sunrise, lateral, and weightbearing bilateral AP and bilateral rosenberg views    Comments:   Please include patellar sunrise, lateral, and weightbearing bilateral AP  and bilateral rosenberg views    Order Specific Question:   Preferred imaging location?    Answer:   [facility]  . DG Knee 1-2 Views Right    Standing Status:   Future    Standing Expiration Date:   02/04/2019    Order Specific Question:   Reason for Exam (SYMPTOM  OR DIAGNOSIS REQUIRED)    Answer:   For use with the left knee x-ray bilateral AP and Rosenberg standing.    Order Specific Question:   Preferred imaging location?    Answer:   [facility]   Meds ordered this encounter  Medications  . diclofenac sodium (VOLTAREN) 1 % GEL    Sig: Apply 4 g topically 4 (four) times daily. To affected joint.    Dispense:  100 g    Refill:  11    Have tried and failed ibuprofen and aleve. Knee OA/DJD    Historical information moved to improve visibility of documentation.  Past Medical History:  Diagnosis Date  . Diverticulosis   . Hiatal hernia   . Hyperlipidemia    borderline  . Hypertension    Past Surgical History:  Procedure Laterality Date  . ABDOMINAL HYSTERECTOMY     TAH Pt. unsure if BSO  . BREAST REDUCTION SURGERY  1994  . BREAST SURGERY     Reduction  . LIPOSUCTION    . REDUCTION MAMMAPLASTY Bilateral 1994   Social History   Tobacco Use  . Smoking status: Never Smoker  . Smokeless tobacco: Never Used  Substance Use Topics  . Alcohol use: No    Alcohol/week: 0.0 standard drinks   family history includes Breast cancer in her other; Breast cancer (age of onset: 62) in her sister; Heart attack in her father; Hypertension in her brother, father, mother, and sister; Kidney disease in her mother.  Medications: Current Outpatient Medications  Medication Sig Dispense Refill  . amLODipine (NORVASC) 5 MG tablet TAKE 1 TABLET BY MOUTH EVERY DAY 90 tablet 1  . aspirin 81 MG tablet Take 81 mg by mouth daily.      . Calcium Carbonate-Vitamin D (CALCIUM 600+D) 600-200 MG-UNIT TABS Take by mouth.      Marland Kitchen lisinopril-hydrochlorothiazide (PRINZIDE,ZESTORETIC)  20-12.5 MG tablet TAKE 1 TABLET BY MOUTH DAILY. 90 tablet 1  . MAGNESIUM PO Take 2 tablets by mouth daily.    . Multiple Vitamins-Minerals (MULTIVITAMIN PO) Take by mouth.    . Omega-3 Fatty Acids (FISH OIL) 1000 MG CPDR Take by mouth.      Marland Kitchen OVER THE COUNTER MEDICATION Curamin Extra Strength PO    . OVER THE COUNTER MEDICATION Joint Astin PO    . Probiotic Product (PROBIOTIC DAILY PO) Take by mouth.    . diclofenac sodium (VOLTAREN) 1 % GEL Apply 4 g topically 4 (four) times daily. To affected joint. 100 g 11   No current facility-administered medications for this visit.    Allergies  Allergen Reactions  . Penicillins Rash    REACTION: rash  Discussed warning signs or symptoms. Please see discharge instructions. Patient expresses understanding.

## 2017-12-05 ENCOUNTER — Encounter: Payer: Medicare Other | Admitting: Physical Therapy

## 2017-12-18 ENCOUNTER — Ambulatory Visit (INDEPENDENT_AMBULATORY_CARE_PROVIDER_SITE_OTHER): Payer: Medicare Other

## 2017-12-18 DIAGNOSIS — M25462 Effusion, left knee: Secondary | ICD-10-CM | POA: Diagnosis not present

## 2017-12-18 DIAGNOSIS — M170 Bilateral primary osteoarthritis of knee: Secondary | ICD-10-CM

## 2017-12-18 DIAGNOSIS — M1711 Unilateral primary osteoarthritis, right knee: Secondary | ICD-10-CM | POA: Diagnosis not present

## 2017-12-18 DIAGNOSIS — M25562 Pain in left knee: Principal | ICD-10-CM

## 2017-12-18 DIAGNOSIS — M1712 Unilateral primary osteoarthritis, left knee: Secondary | ICD-10-CM | POA: Diagnosis not present

## 2018-02-17 ENCOUNTER — Other Ambulatory Visit: Payer: Self-pay | Admitting: Family Medicine

## 2018-03-04 ENCOUNTER — Telehealth: Payer: Self-pay

## 2018-03-04 DIAGNOSIS — G8929 Other chronic pain: Secondary | ICD-10-CM

## 2018-03-04 DIAGNOSIS — M25561 Pain in right knee: Principal | ICD-10-CM

## 2018-03-04 DIAGNOSIS — M25562 Pain in left knee: Principal | ICD-10-CM

## 2018-03-04 NOTE — Telephone Encounter (Signed)
Kwame complains of bilateral knee pain. She would like to be referred to Ortho.   Dr Lequita Halt  [phone]

## 2018-03-04 NOTE — Telephone Encounter (Signed)
Okay to place referral.  Thank you. 

## 2018-03-05 NOTE — Telephone Encounter (Signed)
Left message advising patient of recommendations.  °

## 2018-03-19 ENCOUNTER — Ambulatory Visit: Payer: Medicare Other | Admitting: Physician Assistant

## 2018-03-24 ENCOUNTER — Ambulatory Visit (INDEPENDENT_AMBULATORY_CARE_PROVIDER_SITE_OTHER): Payer: Medicare Other | Admitting: Family Medicine

## 2018-03-24 ENCOUNTER — Encounter: Payer: Self-pay | Admitting: Family Medicine

## 2018-03-24 VITALS — BP 135/56 | HR 82 | Temp 98.1°F | Ht 66.0 in | Wt 223.0 lb

## 2018-03-24 DIAGNOSIS — J329 Chronic sinusitis, unspecified: Secondary | ICD-10-CM | POA: Diagnosis not present

## 2018-03-24 DIAGNOSIS — J40 Bronchitis, not specified as acute or chronic: Secondary | ICD-10-CM

## 2018-03-24 MED ORDER — BENZONATATE 200 MG PO CAPS: 200.0000 mg | ORAL_CAPSULE | Freq: Two times a day (BID) | ORAL | 0 refills | Status: DC | PRN

## 2018-03-24 MED ORDER — AZITHROMYCIN 250 MG PO TABS: ORAL_TABLET | ORAL | 0 refills | Status: AC

## 2018-03-24 NOTE — Progress Notes (Signed)
Acute Office Visit  Subjective:    Patient ID: [name], female    DOB: 31-Oct-1945, 73 y.o.   MRN: [id number]  Chief Complaint  Patient presents with  . Cough    x 4 days,taking cough/congestion medication denies fevers she has had some night sweats and coughing up thick white mucous    HPI Patient is in today for upper respiratory symptoms that started 5 days ago last Thursday.  She has had some cough with thick sputum as well as some nasal congestion and she has been blowing out a little bit of blood.  She has had some night sweats.  She denies any significant shortness of breath or chest pain.  No ear pain.  Today she has had laryngitis and has been able to barely talk.  No GI symptoms.  Known fever during the daytime.  Past Medical History:  Diagnosis Date  . Diverticulosis   . Hiatal hernia   . Hyperlipidemia    borderline  . Hypertension     Past Surgical History:  Procedure Laterality Date  . ABDOMINAL HYSTERECTOMY     TAH Pt. unsure if BSO  . BREAST REDUCTION SURGERY  1994  . BREAST SURGERY     Reduction  . LIPOSUCTION    . REDUCTION MAMMAPLASTY Bilateral 1994    Family History  Problem Relation Age of Onset  . Breast cancer Sister 32       COD breast cancer and pneumonia  . Hypertension Sister   . Heart attack Father   . Hypertension Father   . Hypertension Mother   . Kidney disease Mother   . Breast cancer Other        ages 61 and 51--2 nieces  . Hypertension Brother   . Colon cancer Neg Hx   . Esophageal cancer Neg Hx   . Rectal cancer Neg Hx   . Stomach cancer Neg Hx     Social History   Socioeconomic History  . Marital status: Married    Spouse name: Jomarie Longs  . Number of children: 0  . Years of education: 52  . Highest education level: 12th grade  Occupational History  . Occupation: retired    Comment: land of [employer]  Social Needs  . Financial resource strain: Not hard at all  . Food insecurity:    Worry: Never  true    Inability: Never true  . Transportation needs:    Medical: No    Non-medical: No  Tobacco Use  . Smoking status: Never Smoker  . Smokeless tobacco: Never Used  Substance and Sexual Activity  . Alcohol use: No    Alcohol/week: 0.0 standard drinks  . Drug use: No  . Sexual activity: Not Currently    Partners: Male    Comment: HYST-1st intercourse 20 yo-5 partners  Lifestyle  . Physical activity:    Days per week: 1 day    Minutes per session: 40 min  . Stress: Not at all  Relationships  . Social connections:    Talks on phone: More than three times a week    Gets together: Once a week    Attends religious service: More than 4 times per year    Active member of club or organization: Yes    Attends meetings of clubs or organizations: More than 4 times per year    Relationship status: Married  . Intimate partner violence:    Fear of current or ex partner: No    Emotionally  abused: No    Physically abused: No    Forced sexual activity: No  Other Topics Concern  . Not on file  Social History Narrative   Exercising some 1 day a week, goes to [facility] and does aerobics in the water and walks on treadmill. Discussed with patient to increase frequency of exercise a week.    Outpatient Medications Prior to Visit  Medication Sig Dispense Refill  . amLODipine (NORVASC) 5 MG tablet TAKE 1 TABLET BY MOUTH EVERY DAY 90 tablet 1  . aspirin 81 MG tablet Take 81 mg by mouth daily.      . Calcium Carbonate-Vitamin D (CALCIUM 600+D) 600-200 MG-UNIT TABS Take by mouth.      . diclofenac sodium (VOLTAREN) 1 % GEL Apply 4 g topically 4 (four) times daily. To affected joint. 100 g 11  . lisinopril-hydrochlorothiazide (PRINZIDE,ZESTORETIC) 20-12.5 MG tablet TAKE 1 TABLET BY MOUTH DAILY. 90 tablet 1  . MAGNESIUM PO Take 2 tablets by mouth daily.    . Multiple Vitamins-Minerals (MULTIVITAMIN PO) Take by mouth.    . Omega-3 Fatty Acids (FISH OIL) 1000 MG CPDR Take by mouth.      Marland Kitchen OVER THE  COUNTER MEDICATION Curamin Extra Strength PO    . OVER THE COUNTER MEDICATION Joint Astin PO    . Probiotic Product (PROBIOTIC DAILY PO) Take by mouth.     No facility-administered medications prior to visit.     Allergies  Allergen Reactions  . Penicillins Rash    REACTION: rash    ROS     Objective:    Physical Exam  Constitutional: She is oriented to person, place, and time. She appears well-developed and well-nourished.  HENT:  Head: Normocephalic and atraumatic.  Right Ear: External ear normal.  Left Ear: External ear normal.  Nose: Nose normal.  Mouth/Throat: Oropharynx is clear and moist.  TMs and canals are clear.   Eyes: Pupils are equal, round, and reactive to light. Conjunctivae and EOM are normal.  Neck: Neck supple. No thyromegaly present.  Cardiovascular: Normal rate, regular rhythm and normal heart sounds.  Pulmonary/Chest: Effort normal and breath sounds normal. She has no wheezes.  Lymphadenopathy:    She has no cervical adenopathy.  Neurological: She is alert and oriented to person, place, and time.  Skin: Skin is warm and dry.  Psychiatric: She has a normal mood and affect.    BP (!) 135/56   Pulse 82   Temp 98.1 F (36.7 C)   Ht 5\' 6"  (1.676 m)   Wt 223 lb (101.2 kg)   SpO2 100%   BMI 35.99 kg/m  Wt Readings from Last 3 Encounters:  03/24/18 223 lb (101.2 kg)  12/04/17 223 lb (101.2 kg)  11/26/17 225 lb (102.1 kg)    There are no preventive care reminders to display for this patient.  There are no preventive care reminders to display for this patient.   Lab Results  Component Value Date   TSH 1.17 04/08/2015   Lab Results  Component Value Date   WBC 4.4 04/08/2015   HGB 11.6 (L) 04/08/2015   HCT 35.6 (L) 04/08/2015   MCV 83.2 04/08/2015   PLT 254 04/08/2015   Lab Results  Component Value Date   NA 141 11/28/2017   K 4.2 11/28/2017   CO2 28 11/28/2017   GLUCOSE 106 (H) 11/28/2017   BUN 15 11/28/2017   CREATININE 0.96 (H)  11/28/2017   BILITOT 0.6 11/28/2017   ALKPHOS 94 10/30/2016  AST 20 11/28/2017   ALT 17 11/28/2017   PROT 7.1 11/28/2017   ALBUMIN 4.4 10/30/2016   CALCIUM 9.4 11/28/2017   Lab Results  Component Value Date   CHOL 186 11/28/2017   Lab Results  Component Value Date   HDL 43 (L) 11/28/2017   Lab Results  Component Value Date   LDLCALC 120 (H) 11/28/2017   Lab Results  Component Value Date   TRIG 118 11/28/2017   Lab Results  Component Value Date   CHOLHDL 4.3 11/28/2017   Lab Results  Component Value Date   HGBA1C 6.0 (A) 11/26/2017       Assessment & Plan:   Problem List Items Addressed This Visit    None    Visit Diagnoses    Sinobronchitis    -  Primary   Relevant Medications   azithromycin (ZITHROMAX) 250 MG tablet   benzonatate (TESSALON) 200 MG capsule     Suspect viral but she has had symptoms for 5 days and says she is getting a lot of thick sputum.  Not better in the next couple days and okay to fill the prescription for azithromycin.  Also given a prescription for [employer] Perles.  Not better in 1 week.  Meds ordered this encounter  Medications  . azithromycin (ZITHROMAX) 250 MG tablet    Sig: 2 Ttabs PO on Day 1, then one a day x 4 days.    Dispense:  6 tablet    Refill:  0  . benzonatate (TESSALON) 200 MG capsule    Sig: Take 1 capsule (200 mg total) by mouth 2 (two) times daily as needed for cough.    Dispense:  20 capsule    Refill:  0     Nani Gasseratherine Metheney, MD

## 2018-03-24 NOTE — Patient Instructions (Signed)
Upper Respiratory Infection, Adult An upper respiratory infection (URI) is a common viral infection of the nose, throat, and upper air passages that lead to the lungs. The most common type of URI is the common cold. URIs usually get better on their own, without medical treatment. What are the causes? A URI is caused by a virus. You may catch a virus by:  Breathing in droplets from an infected person's cough or sneeze.  Touching something that has been exposed to the virus (contaminated) and then touching your mouth, nose, or eyes. What increases the risk? You are more likely to get a URI if:  You are very young or very old.  It is autumn or winter.  You have close contact with others, such as at a daycare, school, or health care facility.  You smoke.  You have long-term (chronic) heart or lung disease.  You have a weakened disease-fighting (immune) system.  You have nasal allergies or asthma.  You are experiencing a lot of stress.  You work in an area that has poor air circulation.  You have poor nutrition. What are the signs or symptoms? A URI usually involves some of the following symptoms:  Runny or stuffy (congested) nose.  Sneezing.  Cough.  Sore throat.  Headache.  Fatigue.  Fever.  Loss of appetite.  Pain in your forehead, behind your eyes, and over your cheekbones (sinus pain).  Muscle aches.  Redness or irritation of the eyes.  Pressure in the ears or face. How is this diagnosed? This condition may be diagnosed based on your medical history and symptoms, and a physical exam. Your health care provider may use a cotton swab to take a mucus sample from your nose (nasal swab). This sample can be tested to determine what virus is causing the illness. How is this treated? URIs usually get better on their own within 7-10 days. You can take steps at home to relieve your symptoms. Medicines cannot cure URIs, but your health care provider may recommend  certain medicines to help relieve symptoms, such as:  Over-the-counter cold medicines.  Cough suppressants. Coughing is a type of defense against infection that helps to clear the respiratory system, so take these medicines only as recommended by your health care provider.  Fever-reducing medicines. Follow these instructions at home: Activity  Rest as needed.  If you have a fever, stay home from work or school until your fever is gone or until your health care provider says you are no longer contagious. Your health care provider may have you wear a face mask to prevent your infection from spreading. Relieving symptoms  Gargle with a salt-water mixture 3-4 times a day or as needed. To make a salt-water mixture, completely dissolve -1 tsp of salt in 1 cup of warm water.  Use a cool-mist humidifier to add moisture to the air. This can help you breathe more easily. Eating and drinking   Drink enough fluid to keep your urine pale yellow.  Eat soups and other clear broths. General instructions   Take over-the-counter and prescription medicines only as told by your health care provider. These include cold medicines, fever reducers, and cough suppressants.  Do not use any products that contain nicotine or tobacco, such as cigarettes and e-cigarettes. If you need help quitting, ask your health care provider.  Stay away from secondhand smoke.  Stay up to date on all immunizations, including the yearly (annual) flu vaccine.  Keep all follow-up visits as told by your health   care provider. This is important. How to prevent the spread of infection to others   URIs can be passed from person to person (are contagious). To prevent the infection from spreading: ? Wash your hands often with soap and water. If soap and water are not available, use hand sanitizer. ? Avoid touching your mouth, face, eyes, or nose. ? Cough or sneeze into a tissue or your sleeve or elbow instead of into your hand  or into the air. Contact a health care provider if:  You are getting worse instead of better.  You have a fever or chills.  Your mucus is brown or red.  You have yellow or brown discharge coming from your nose.  You have pain in your face, especially when you bend forward.  You have swollen neck glands.  You have pain while swallowing.  You have white areas in the back of your throat. Get help right away if:  You have shortness of breath that gets worse.  You have severe or persistent: ? Headache. ? Ear pain. ? Sinus pain. ? Chest pain.  You have chronic lung disease along with any of the following: ? Wheezing. ? Prolonged cough. ? Coughing up blood. ? A change in your usual mucus.  You have a stiff neck.  You have changes in your: ? Vision. ? Hearing. ? Thinking. ? Mood. Summary  An upper respiratory infection (URI) is a common infection of the nose, throat, and upper air passages that lead to the lungs.  A URI is caused by a virus.  URIs usually get better on their own within 7-10 days.  Medicines cannot cure URIs, but your health care provider may recommend certain medicines to help relieve symptoms. This information is not intended to replace advice given to you by your health care provider. Make sure you discuss any questions you have with your health care provider. Document Released: 08/08/2000 Document Revised: 09/28/2016 Document Reviewed: 09/28/2016 Elsevier Interactive Patient Education  2019 Elsevier Inc.    

## 2018-03-26 ENCOUNTER — Telehealth: Payer: Self-pay

## 2018-03-26 NOTE — Telephone Encounter (Signed)
Since she is only been on antibiotics for right at 48 hours I would actually encourage her to give it at least 1 more day and see if it breaks the fever.  If at that point she still is running a fever then please let me know and I will switch antibiotics.

## 2018-03-26 NOTE — Telephone Encounter (Signed)
[name] called and states she is not much better. She is had night sweats last night. The cough is only slightly better. Denies wheezing, fever or chills. She was advised to call with an update.

## 2018-03-27 NOTE — Telephone Encounter (Signed)
Patient advised of recommendations.  

## 2018-03-31 ENCOUNTER — Other Ambulatory Visit: Payer: Self-pay | Admitting: Family Medicine

## 2018-03-31 DIAGNOSIS — I10 Essential (primary) hypertension: Secondary | ICD-10-CM

## 2018-03-31 NOTE — Telephone Encounter (Signed)
[name] called back and left a message stating she did go pick up some OTC medication.

## 2018-03-31 NOTE — Telephone Encounter (Signed)
[name] called and states she is feeling better with the exception of chest congestion. The cough is a little productive. She has not tried any medications over the counter. Please advise which OTC medications would be best for chest congestion.

## 2018-04-02 DIAGNOSIS — M25561 Pain in right knee: Secondary | ICD-10-CM | POA: Insufficient documentation

## 2018-04-02 DIAGNOSIS — M170 Bilateral primary osteoarthritis of knee: Secondary | ICD-10-CM | POA: Diagnosis not present

## 2018-04-07 NOTE — Telephone Encounter (Signed)
[name] called and states she still has the cough. She wanted to know what else she can do for the cough. Advised her it isn't uncommon for the cough to last for a few weeks.

## 2018-04-08 NOTE — Telephone Encounter (Signed)
Agree.  Overall she is feeling much better except the cough is just lingering this is not atypical.  It is typically caught a postinfectious cough and can last for a couple of weeks after the acute illness has resolved.  If she is still feeling short of breath or noticing a lot of congestion or wheezing in her chest then I would recommend that we get a chest x-ray for further evaluation.

## 2018-04-08 NOTE — Telephone Encounter (Signed)
Pt advised of providers recommendations.  

## 2018-04-14 ENCOUNTER — Ambulatory Visit (INDEPENDENT_AMBULATORY_CARE_PROVIDER_SITE_OTHER): Payer: Medicare Other

## 2018-04-14 ENCOUNTER — Ambulatory Visit (INDEPENDENT_AMBULATORY_CARE_PROVIDER_SITE_OTHER): Payer: Medicare Other | Admitting: Physician Assistant

## 2018-04-14 ENCOUNTER — Encounter: Payer: Self-pay | Admitting: Physician Assistant

## 2018-04-14 VITALS — BP 123/87 | HR 85 | Temp 98.5°F | Ht 66.0 in | Wt 216.0 lb

## 2018-04-14 DIAGNOSIS — R05 Cough: Secondary | ICD-10-CM

## 2018-04-14 DIAGNOSIS — R058 Other specified cough: Secondary | ICD-10-CM

## 2018-04-14 DIAGNOSIS — R059 Cough, unspecified: Secondary | ICD-10-CM

## 2018-04-14 MED ORDER — HYDROCOD POLST-CPM POLST ER 10-8 MG/5ML PO SUER: 5.0000 mL | Freq: Two times a day (BID) | ORAL | 0 refills | Status: DC | PRN

## 2018-04-14 MED ORDER — PREDNISONE 50 MG PO TABS: ORAL_TABLET | ORAL | 0 refills | Status: DC

## 2018-04-14 NOTE — Progress Notes (Signed)
Call pt: normal CXR. No signs of infection. Treatment plan stays the same.

## 2018-04-14 NOTE — Progress Notes (Signed)
Subjective:    Patient ID: [name], female    DOB: 1945/10/28, 73 y.o.   MRN: [id number]  HPI Pt is a 73 yo female with no hx of asthma or COPD she does not smoke who presents to the clinic with persisent cough for the last 3 weeks. She was seen on 03/24/18 and treated for sinobronchitis with zpak and tessalon pearls. She does not feel like she got much better. No fever, chills, body aches. Cough is dry. She does admit to some acid reflux symptoms.    .. Active Ambulatory Problems    Diagnosis Date Noted  . Hyperlipidemia 01/06/2008  . HYPERTENSION, BENIGN 01/06/2008  . Pelvic kidney 07/30/2012  . Lumbar degenerative disc disease 04/07/2013  . Left shoulder pain 07/27/2013  . Primary osteoarthritis of both knees 07/27/2013  . Chronic kidney disease (CKD) stage G3b/A1, moderately decreased glomerular filtration rate (GFR) between 30-44 mL/min/1.73 square meter and albuminuria creatinine ratio less than 30 mg/g (HCC) 06/11/2014  . IFG (impaired fasting glucose) 06/11/2014  . Spinal stenosis of lumbar region with neurogenic claudication 05/05/2016   Resolved Ambulatory Problems    Diagnosis Date Noted  . KNEE PAIN, RIGHT 09/13/2009  . Encounter for Medicare annual wellness exam 10/20/2015  . Needs flu shot 10/20/2015  . Infected sebaceous cyst 12/07/2015   Past Medical History:  Diagnosis Date  . Diverticulosis   . Hiatal hernia   . Hypertension       Review of Systems    see HPI.  Objective:   Physical Exam Vitals signs reviewed.  Constitutional:      Appearance: Normal appearance.  HENT:     Head: Normocephalic and atraumatic.     Right Ear: Tympanic membrane and ear canal normal.     Left Ear: Tympanic membrane and ear canal normal.     Nose: Congestion present.     Mouth/Throat:     Pharynx: Oropharynx is clear. No oropharyngeal exudate.  Eyes:     Conjunctiva/sclera: Conjunctivae normal.  Cardiovascular:     Rate and Rhythm: Normal rate and regular  rhythm.     Pulses: Normal pulses.  Pulmonary:     Effort: Pulmonary effort is normal.     Breath sounds: Normal breath sounds. No wheezing or rhonchi.  Neurological:     General: No focal deficit present.     Mental Status: She is alert and oriented to person, place, and time.  Psychiatric:        Mood and Affect: Mood normal.        Behavior: Behavior normal.           Assessment & Plan:  Marland KitchenMarland KitchenMarlisha was seen today for cough.  Diagnoses and all orders for this visit:  Post-viral cough syndrome -     chlorpheniramine-HYDROcodone (TUSSIONEX) 10-8 MG/5ML SUER; Take 5 mLs by mouth every 12 (twelve) hours as needed. -     predniSONE (DELTASONE) 50 MG tablet; One tab PO daily for 5 days.  Cough -     chlorpheniramine-HYDROcodone (TUSSIONEX) 10-8 MG/5ML SUER; Take 5 mLs by mouth every 12 (twelve) hours as needed. -     predniSONE (DELTASONE) 50 MG tablet; One tab PO daily for 5 days. -     DG Chest 2 View   Reassured patient that symptoms appear like post viral cough syndrome. Her lungs sound GREAT.  CXR was negative for any acute findings. Prednisone burst sent with tussionex to use at bedtime. Discussed sedation with cough syrup.  Discussed could be some reflux causing cough as well. We may need to consider omeprazole for a few weeks if not improving with above treatment. HO for cough given.

## 2018-04-14 NOTE — Progress Notes (Signed)
cou

## 2018-04-14 NOTE — Patient Instructions (Signed)

## 2018-05-07 ENCOUNTER — Other Ambulatory Visit: Payer: Self-pay

## 2018-05-07 ENCOUNTER — Ambulatory Visit (INDEPENDENT_AMBULATORY_CARE_PROVIDER_SITE_OTHER): Payer: Medicare Other

## 2018-05-07 ENCOUNTER — Encounter: Payer: Self-pay | Admitting: Family Medicine

## 2018-05-07 ENCOUNTER — Ambulatory Visit (INDEPENDENT_AMBULATORY_CARE_PROVIDER_SITE_OTHER): Payer: Medicare Other | Admitting: Family Medicine

## 2018-05-07 VITALS — BP 142/68 | HR 86 | Ht 66.0 in | Wt 220.0 lb

## 2018-05-07 DIAGNOSIS — M79671 Pain in right foot: Secondary | ICD-10-CM | POA: Diagnosis not present

## 2018-05-07 DIAGNOSIS — I10 Essential (primary) hypertension: Secondary | ICD-10-CM

## 2018-05-07 DIAGNOSIS — M19071 Primary osteoarthritis, right ankle and foot: Secondary | ICD-10-CM | POA: Diagnosis not present

## 2018-05-07 NOTE — Progress Notes (Signed)
Acute Office Visit  Subjective:    Patient ID: [name], female    DOB: 1946/02/18, 73 y.o.   MRN: [id number]  Chief Complaint  Patient presents with  . Foot Pain    top of R foot. denies any injury/trauma. she has been using ice for comfort    HPI Patient is in today for pain on the top of the right foot.  She says is been there for about a week.  She does not remember any specific injury or trauma.  She might have worn a new pair to shoes used to church on Sunday but that would have been after it started.  She says she has been icing it and it does help some.  No prior history of gout.  Says it does feel worse with a lot of walking it is not keeping her from doing anything but she will limp occasionally.  Past Medical History:  Diagnosis Date  . Diverticulosis   . Hiatal hernia   . Hyperlipidemia    borderline  . Hypertension     Past Surgical History:  Procedure Laterality Date  . ABDOMINAL HYSTERECTOMY     TAH Pt. unsure if BSO  . BREAST REDUCTION SURGERY  1994  . BREAST SURGERY     Reduction  . LIPOSUCTION    . REDUCTION MAMMAPLASTY Bilateral 1994    Family History  Problem Relation Age of Onset  . Breast cancer Sister 80       COD breast cancer and pneumonia  . Hypertension Sister   . Heart attack Father   . Hypertension Father   . Hypertension Mother   . Kidney disease Mother   . Breast cancer Other        ages 1 and 51--2 nieces  . Hypertension Brother   . Colon cancer Neg Hx   . Esophageal cancer Neg Hx   . Rectal cancer Neg Hx   . Stomach cancer Neg Hx     Social History   Socioeconomic History  . Marital status: Married    Spouse name: Jomarie Longs  . Number of children: 0  . Years of education: 69  . Highest education level: 12th grade  Occupational History  . Occupation: retired    Comment: land of [employer]  Social Needs  . Financial resource strain: Not hard at all  . Food insecurity:    Worry: Never true   Inability: Never true  . Transportation needs:    Medical: No    Non-medical: No  Tobacco Use  . Smoking status: Never Smoker  . Smokeless tobacco: Never Used  Substance and Sexual Activity  . Alcohol use: No    Alcohol/week: 0.0 standard drinks  . Drug use: No  . Sexual activity: Not Currently    Partners: Male    Comment: HYST-1st intercourse 46 yo-5 partners  Lifestyle  . Physical activity:    Days per week: 1 day    Minutes per session: 40 min  . Stress: Not at all  Relationships  . Social connections:    Talks on phone: More than three times a week    Gets together: Once a week    Attends religious service: More than 4 times per year    Active member of club or organization: Yes    Attends meetings of clubs or organizations: More than 4 times per year    Relationship status: Married  . Intimate partner violence:    Fear of current or  ex partner: No    Emotionally abused: No    Physically abused: No    Forced sexual activity: No  Other Topics Concern  . Not on file  Social History Narrative   Exercising some 1 day a week, goes to [facility] and does aerobics in the water and walks on treadmill. Discussed with patient to increase frequency of exercise a week.    Outpatient Medications Prior to Visit  Medication Sig Dispense Refill  . amLODipine (NORVASC) 5 MG tablet TAKE 1 TABLET BY MOUTH EVERY DAY 90 tablet 1  . aspirin 81 MG tablet Take 81 mg by mouth daily.      . Calcium Carbonate-Vitamin D (CALCIUM 600+D) 600-200 MG-UNIT TABS Take by mouth.      . diclofenac sodium (VOLTAREN) 1 % GEL Apply 4 g topically 4 (four) times daily. To affected joint. 100 g 11  . lisinopril-hydrochlorothiazide (PRINZIDE,ZESTORETIC) 20-12.5 MG tablet TAKE 1 TABLET BY MOUTH DAILY. 90 tablet 1  . MAGNESIUM PO Take 2 tablets by mouth daily.    . Multiple Vitamins-Minerals (MULTIVITAMIN PO) Take by mouth.    . Omega-3 Fatty Acids (FISH OIL) 1000 MG CPDR Take by mouth.      Marland Kitchen OVER THE COUNTER  MEDICATION Curamin Extra Strength PO    . OVER THE COUNTER MEDICATION Joint Astin PO    . Probiotic Product (PROBIOTIC DAILY PO) Take by mouth.    . chlorpheniramine-HYDROcodone (TUSSIONEX) 10-8 MG/5ML SUER Take 5 mLs by mouth every 12 (twelve) hours as needed. 60 mL 0  . predniSONE (DELTASONE) 50 MG tablet One tab PO daily for 5 days. 5 tablet 0   No facility-administered medications prior to visit.     Allergies  Allergen Reactions  . Penicillins Rash    REACTION: rash    ROS     Objective:    Physical Exam  Constitutional: She is oriented to person, place, and time. She appears well-developed and well-nourished.  HENT:  Head: Normocephalic and atraumatic.  Eyes: Conjunctivae and EOM are normal.  Cardiovascular: Normal rate.  Pulmonary/Chest: Effort normal.  Musculoskeletal:     Comments: Right foot with some swelling on the top of the foot more proximally.  Mostly over the third and fourth metatarsals a little bit over the second proximal metatarsal head.  The area actually looks a little swollen and puffy and feels more hot to touch than the rest of the skin on her foot.  Neurological: She is alert and oriented to person, place, and time.  Skin: Skin is dry. No pallor.  Psychiatric: She has a normal mood and affect. Her behavior is normal.  Vitals reviewed.   BP (!) 142/68   Pulse 86   Ht  (1.676 m)   Wt 220 lb (99.8 kg)   SpO2 99%   BMI 35.51 kg/m  Wt Readings from Last 3 Encounters:  05/07/18 220 lb (99.8 kg)  04/14/18 216 lb (98 kg)  03/24/18 223 lb (101.2 kg)    Health Maintenance Due  Topic Date Due  . TETANUS/TDAP  02/26/2017    There are no preventive care reminders to display for this patient.   Lab Results  Component Value Date   TSH 1.17 04/08/2015   Lab Results  Component Value Date   WBC 4.4 04/08/2015   HGB 11.6 (L) 04/08/2015   HCT 35.6 (L) 04/08/2015   MCV 83.2 04/08/2015   PLT 254 04/08/2015   Lab Results  Component Value  Date   NA  141 11/28/2017   K 4.2 11/28/2017   CO2 28 11/28/2017   GLUCOSE 106 (H) 11/28/2017   BUN 15 11/28/2017   CREATININE 0.96 (H) 11/28/2017   BILITOT 0.6 11/28/2017   ALKPHOS 94 10/30/2016   AST 20 11/28/2017   ALT 17 11/28/2017   PROT 7.1 11/28/2017   ALBUMIN 4.4 10/30/2016   CALCIUM 9.4 11/28/2017   Lab Results  Component Value Date   CHOL 186 11/28/2017   Lab Results  Component Value Date   HDL 43 (L) 11/28/2017   Lab Results  Component Value Date   LDLCALC 120 (H) 11/28/2017   Lab Results  Component Value Date   TRIG 118 11/28/2017   Lab Results  Component Value Date   CHOLHDL 4.3 11/28/2017   Lab Results  Component Value Date   HGBA1C 6.0 (A) 11/26/2017       Assessment & Plan:   Problem List Items Addressed This Visit      Cardiovascular and Mediastinum   HYPERTENSION, BENIGN    Other Visit Diagnoses    Right foot pain    -  Primary   Relevant Orders   DG Foot Complete Right     Foot pain-unclear etiology.  Without any trauma or injury fracture is less likely but she could also have a stress fracture it is definitely swollen and tender on exam and a little bit in with increased warmth.  I do not see any breaks in the skin or active infection but I did encourage her to keep an eye on it if she feels like it is getting more swollen or warm or notices any erythema to please let me know.  Hypertension-blood pressure elevated just slightly today.  No orders of the defined types were placed in this encounter.    Nani Gasser, MD

## 2018-05-08 DIAGNOSIS — M179 Osteoarthritis of knee, unspecified: Secondary | ICD-10-CM | POA: Insufficient documentation

## 2018-05-08 DIAGNOSIS — M170 Bilateral primary osteoarthritis of knee: Secondary | ICD-10-CM | POA: Diagnosis not present

## 2018-05-09 ENCOUNTER — Ambulatory Visit: Payer: Medicare Other | Admitting: Sports Medicine

## 2018-07-29 ENCOUNTER — Encounter: Payer: Self-pay | Admitting: Sports Medicine

## 2018-07-29 ENCOUNTER — Ambulatory Visit (INDEPENDENT_AMBULATORY_CARE_PROVIDER_SITE_OTHER): Payer: Medicare Other | Admitting: Sports Medicine

## 2018-07-29 ENCOUNTER — Ambulatory Visit (INDEPENDENT_AMBULATORY_CARE_PROVIDER_SITE_OTHER): Payer: Medicare Other

## 2018-07-29 ENCOUNTER — Other Ambulatory Visit: Payer: Self-pay

## 2018-07-29 DIAGNOSIS — M1611 Unilateral primary osteoarthritis, right hip: Secondary | ICD-10-CM

## 2018-07-29 MED ORDER — NAPROXEN 500 MG PO TABS: 500.0000 mg | ORAL_TABLET | Freq: Two times a day (BID) | ORAL | 3 refills | Status: DC

## 2018-07-29 NOTE — Progress Notes (Signed)
Subjective:    CC: Right groin pain  HPI: For many months this pleasant 73 year old female has had pain that she localizes in her right groin, worse with weightbearing, moderate gelling.  She is tried some Aleve with only minimal improvement.  Pain is moderate, persistent, localized without radiation.  No trauma.  I reviewed the past medical history, family history, social history, surgical history, and allergies today and no changes were needed.  Please see the problem list section below in epic for further details.  Past Medical History: Past Medical History:  Diagnosis Date  . Diverticulosis   . Hiatal hernia   . Hyperlipidemia    borderline  . Hypertension    Past Surgical History: Past Surgical History:  Procedure Laterality Date  . ABDOMINAL HYSTERECTOMY     TAH Pt. unsure if BSO  . BREAST REDUCTION SURGERY  1994  . BREAST SURGERY     Reduction  . LIPOSUCTION    . REDUCTION MAMMAPLASTY Bilateral 1994   Social History: Social History   Socioeconomic History  . Marital status: Married    Spouse name: Jomarie LongsJoseph  . Number of children: 0  . Years of education: 2714  . Highest education level: 12th grade  Occupational History  . Occupation: retired    Comment: [place] [employer]  Social Needs  . Financial resource strain: Not hard at all  . Food insecurity:    Worry: Never true    Inability: Never true  . Transportation needs:    Medical: No    Non-medical: No  Tobacco Use  . Smoking status: Never Smoker  . Smokeless tobacco: Never Used  Substance and Sexual Activity  . Alcohol use: No    Alcohol/week: 0.0 standard drinks  . Drug use: No  . Sexual activity: Not Currently    Partners: Male    Comment: HYST-1st intercourse 73 yo-5 partners  Lifestyle  . Physical activity:    Days per week: 1 day    Minutes per session: 40 min  . Stress: Not at all  Relationships  . Social connections:    Talks on phone: More than three times a week    Gets  together: Once a week    Attends religious service: More than 4 times per year    Active member of club or organization: Yes    Attends meetings of clubs or organizations: More than 4 times per year    Relationship status: Married  Other Topics Concern  . Not on file  Social History Narrative   Exercising some 1 day a week, [facility] and does aerobics in the water and walks on treadmill. Discussed with patient to increase frequency of exercise a week.   Family History: Family History  Problem Relation Age of Onset  . Breast cancer Sister [id number]       COD breast cancer and pneumonia  . Hypertension Sister   . Heart attack Father   . Hypertension Father   . Hypertension Mother   . Kidney disease Mother   . Breast cancer Other        ages 5849 and 51--2 nieces  . Hypertension Brother   . Colon cancer Neg Hx   . Esophageal cancer Neg Hx   . Rectal cancer Neg Hx   . Stomach cancer Neg Hx    Allergies: Allergies  Allergen Reactions  . Penicillins Rash    REACTION: rash   Medications: See med rec.  Review of Systems: No fevers, chills,  night sweats, weight loss, chest pain, or shortness of breath.   Objective:    General: Well Developed, well nourished, and in no acute distress.  Neuro: Alert and oriented x3, extra-ocular muscles intact, sensation grossly intact.  HEENT: Normocephalic, atraumatic, pupils equal round reactive to light, neck supple, no masses, no lymphadenopathy, thyroid nonpalpable.  Skin: Warm and dry, no rashes. Cardiac: Regular rate and rhythm, no murmurs rubs or gallops, no lower extremity edema.  Respiratory: Clear to auscultation bilaterally. Not using accessory muscles, speaking in full sentences. Right hip: ROM IR: 10 Deg with pain, ER: 60 Deg, Flexion: 120 Deg, Extension: 100 Deg, Abduction: 45 Deg, Adduction: 45 Deg Strength IR: 5/5, ER: 5/5, Flexion: 5/5, Extension: 5/5, Abduction: 5/5, Adduction: 5/5 Pelvic alignment unremarkable to inspection and  palpation. Standing hip rotation and gait without trendelenburg / unsteadiness. Greater trochanter without tenderness to palpation. No tenderness over piriformis. No SI joint tenderness and normal minimal SI movement.  Procedure: Real-time Ultrasound Guided injection of the right hip joint Device: GE Logiq E  Verbal informed consent obtained.  Time-out conducted.  Noted no overlying erythema, induration, or other signs of local infection.  Skin prepped in a sterile fashion.  Local anesthesia: Topical Ethyl chloride.  With sterile technique and under real time ultrasound guidance:  22-gauge spinal needle advanced to the femoral head/neck junction, contacted bone and then injected 1 cc Kenalog 40, 2 cc lidocaine, 2 cc bupivacaine Completed without difficulty  Pain immediately resolved suggesting accurate placement of the medication.  Advised to call if fevers/chills, erythema, induration, drainage, or persistent bleeding.  Images permanently stored and available for review in the ultrasound unit.  Impression: Technically successful ultrasound guided injection.  Impression and Recommendations:    Primary osteoarthritis of right hip X-rays, femoroacetabular joint injection. Adding prescription strength Aleve. Return to see me in 1 month.   ___________________________________________ Ihor Austin. Benjamin Stain, M.D., ABFM., CAQSM. Primary Care and Sports Medicine [facility]  Adjunct Professor of Family Medicine  [employer]

## 2018-07-29 NOTE — Assessment & Plan Note (Addendum)
X-rays, femoroacetabular joint injection. Adding prescription strength Aleve. Return to see me in 1 month.

## 2018-08-26 ENCOUNTER — Ambulatory Visit: Payer: Medicare Other | Admitting: Sports Medicine

## 2018-09-10 ENCOUNTER — Other Ambulatory Visit: Payer: Self-pay | Admitting: Family Medicine

## 2018-09-10 ENCOUNTER — Telehealth: Payer: Self-pay

## 2018-09-10 DIAGNOSIS — Z1231 Encounter for screening mammogram for malignant neoplasm of breast: Secondary | ICD-10-CM

## 2018-09-10 NOTE — Telephone Encounter (Signed)
[name] called and left a message stating she needs to schedule an appointment. Please call her back to schedule.

## 2018-09-18 ENCOUNTER — Encounter: Payer: Medicare Other | Admitting: Family Medicine

## 2018-09-23 ENCOUNTER — Ambulatory Visit (INDEPENDENT_AMBULATORY_CARE_PROVIDER_SITE_OTHER): Payer: Medicare Other | Admitting: Family Medicine

## 2018-09-23 ENCOUNTER — Encounter: Payer: Self-pay | Admitting: Family Medicine

## 2018-09-23 ENCOUNTER — Other Ambulatory Visit: Payer: Self-pay

## 2018-09-23 VITALS — BP 134/64 | HR 84 | Ht 66.0 in | Wt 221.0 lb

## 2018-09-23 DIAGNOSIS — R5383 Other fatigue: Secondary | ICD-10-CM

## 2018-09-23 DIAGNOSIS — I10 Essential (primary) hypertension: Secondary | ICD-10-CM

## 2018-09-23 DIAGNOSIS — R7301 Impaired fasting glucose: Secondary | ICD-10-CM | POA: Diagnosis not present

## 2018-09-23 DIAGNOSIS — N189 Chronic kidney disease, unspecified: Secondary | ICD-10-CM | POA: Diagnosis not present

## 2018-09-23 DIAGNOSIS — N183 Chronic kidney disease, stage 3 (moderate): Secondary | ICD-10-CM

## 2018-09-23 DIAGNOSIS — R6889 Other general symptoms and signs: Secondary | ICD-10-CM | POA: Diagnosis not present

## 2018-09-23 DIAGNOSIS — R7989 Other specified abnormal findings of blood chemistry: Secondary | ICD-10-CM | POA: Diagnosis not present

## 2018-09-23 DIAGNOSIS — N1832 Chronic kidney disease, stage 3b: Secondary | ICD-10-CM

## 2018-09-23 MED ORDER — AMLODIPINE BESYLATE 5 MG PO TABS: 5.0000 mg | ORAL_TABLET | Freq: Every day | ORAL | 1 refills | Status: DC

## 2018-09-23 MED ORDER — LISINOPRIL-HYDROCHLOROTHIAZIDE 20-12.5 MG PO TABS: 1.0000 | ORAL_TABLET | Freq: Every day | ORAL | 1 refills | Status: DC

## 2018-09-23 NOTE — Assessment & Plan Note (Signed)
Due to recheck A1C  

## 2018-09-23 NOTE — Assessment & Plan Note (Signed)
Following every 6 months.  Overdue.  Labs ordered today.

## 2018-09-23 NOTE — Progress Notes (Signed)
Established Patient Office Visit  Subjective:  Patient ID: [name], female    DOB: Aug 07, 1945  Age: 73 y.o. MRN: [id number]  CC:  Chief Complaint  Patient presents with  . Hypertension    she reports that she is participating in a Weight loss study at [facility] and when they checked her bp it was 159/84, 173/83,166/82, and did a manual 171/90. she denies having any pain or taking any allergy/sinus/cold medications    HPI [name] presents for elevated BP yesterday.  She was at [facility] for a Weight loss study and they checked her BP 3 times and it was high. She took her BP meds as usual yesterday. She didn't take any allergy meds or NSAID. Thinks might have had more salt in the diet on Sunday. She also didn't sleep well that night.    In regards to her blood pressure she takes her medications regularly.  She denies any side effects or problems.  Last labs were greater than 6 months ago.  She says has been more tired the last few days as well. She says when she has tried to give blood she was told her hemoglobin was low.    Follow-up CKD 3-no recent changes.  No symptoms.  Past Medical History:  Diagnosis Date  . Diverticulosis   . Hiatal hernia   . Hyperlipidemia    borderline  . Hypertension     Past Surgical History:  Procedure Laterality Date  . ABDOMINAL HYSTERECTOMY     TAH Pt. unsure if BSO  . BREAST REDUCTION SURGERY  1994  . BREAST SURGERY     Reduction  . LIPOSUCTION    . REDUCTION MAMMAPLASTY Bilateral 1994    Family History  Problem Relation Age of Onset  . Breast cancer Sister 85       COD breast cancer and pneumonia  . Hypertension Sister   . Heart attack Father   . Hypertension Father   . Hypertension Mother   . Kidney disease Mother   . Breast cancer Other        ages 61 and 51--2 nieces  . Hypertension Brother   . Colon cancer Neg Hx   . Esophageal cancer Neg Hx   . Rectal cancer Neg Hx   . Stomach cancer Neg Hx     Social  History   Socioeconomic History  . Marital status: Married    Spouse name: Jomarie Longs  . Number of children: 0  . Years of education: 64  . Highest education level: 12th grade  Occupational History  . Occupation: retired    Comment: land of [employer]  Social Needs  . Financial resource strain: Not hard at all  . Food insecurity    Worry: Never true    Inability: Never true  . Transportation needs    Medical: No    Non-medical: No  Tobacco Use  . Smoking status: Never Smoker  . Smokeless tobacco: Never Used  Substance and Sexual Activity  . Alcohol use: No    Alcohol/week: 0.0 standard drinks  . Drug use: No  . Sexual activity: Not Currently    Partners: Male    Comment: HYST-1st intercourse 42 yo-5 partners  Lifestyle  . Physical activity    Days per week: 1 day    Minutes per session: 40 min  . Stress: Not at all  Relationships  . Social connections    Talks on phone: More than three times a week  Gets together: Once a week    Attends religious service: More than 4 times per year    Active member of club or organization: Yes    Attends meetings of clubs or organizations: More than 4 times per year    Relationship status: Married  . Intimate partner violence    Fear of current or ex partner: No    Emotionally abused: No    Physically abused: No    Forced sexual activity: No  Other Topics Concern  . Not on file  Social History Narrative   Exercising some 1 day a week, goes to [facility] and does aerobics in the water and walks on treadmill. Discussed with patient to increase frequency of exercise a week.    Outpatient Medications Prior to Visit  Medication Sig Dispense Refill  . aspirin 81 MG tablet Take 81 mg by mouth daily.      . Calcium Carbonate-Vitamin D (CALCIUM 600+D) 600-200 MG-UNIT TABS Take by mouth.      . diclofenac sodium (VOLTAREN) 1 % GEL Apply 4 g topically 4 (four) times daily. To affected joint. 100 g 11  . MAGNESIUM PO Take 2 tablets by  mouth daily.    . Multiple Vitamins-Minerals (MULTIVITAMIN PO) Take by mouth.    . Omega-3 Fatty Acids (FISH OIL) 1000 MG CPDR Take by mouth.      Marland Kitchen OVER THE COUNTER MEDICATION Curamin Extra Strength PO    . OVER THE COUNTER MEDICATION Joint Astin PO    . Probiotic Product (PROBIOTIC DAILY PO) Take by mouth.    Marland Kitchen amLODipine (NORVASC) 5 MG tablet TAKE 1 TABLET BY MOUTH EVERY DAY 90 tablet 1  . lisinopril-hydrochlorothiazide (PRINZIDE,ZESTORETIC) 20-12.5 MG tablet TAKE 1 TABLET BY MOUTH DAILY. 90 tablet 1  . naproxen (NAPROSYN) 500 MG tablet Take 1 tablet (500 mg total) by mouth 2 (two) times daily with a meal. 60 tablet 3   No facility-administered medications prior to visit.     Allergies  Allergen Reactions  . Penicillins Rash    REACTION: rash    ROS Review of Systems    Objective:    Physical Exam  Constitutional: She is oriented to person, place, and time. She appears well-developed and well-nourished.  HENT:  Head: Normocephalic and atraumatic.  Cardiovascular: Normal rate, regular rhythm and normal heart sounds.  Pulmonary/Chest: Effort normal and breath sounds normal.  Neurological: She is alert and oriented to person, place, and time.  Skin: Skin is warm and dry.  Psychiatric: She has a normal mood and affect. Her behavior is normal.    BP 134/64   Pulse 84   Ht 5\' 6"  (1.676 m)   Wt 221 lb (100.2 kg)   SpO2 97%   BMI 35.67 kg/m  Wt Readings from Last 3 Encounters:  09/23/18 221 lb (100.2 kg)  07/29/18 224 lb (101.6 kg)  05/07/18 220 lb (99.8 kg)     Health Maintenance Due  Topic Date Due  . TETANUS/TDAP  02/26/2017    There are no preventive care reminders to display for this patient.  Lab Results  Component Value Date   TSH 1.17 04/08/2015   Lab Results  Component Value Date   WBC 4.4 04/08/2015   HGB 11.6 (L) 04/08/2015   HCT 35.6 (L) 04/08/2015   MCV 83.2 04/08/2015   PLT 254 04/08/2015   Lab Results  Component Value Date   NA 141  11/28/2017   K 4.2 11/28/2017   CO2 28 11/28/2017  GLUCOSE 106 (H) 11/28/2017   BUN 15 11/28/2017   CREATININE 0.96 (H) 11/28/2017   BILITOT 0.6 11/28/2017   ALKPHOS 94 10/30/2016   AST 20 11/28/2017   ALT 17 11/28/2017   PROT 7.1 11/28/2017   ALBUMIN 4.4 10/30/2016   CALCIUM 9.4 11/28/2017   Lab Results  Component Value Date   CHOL 186 11/28/2017   Lab Results  Component Value Date   HDL 43 (L) 11/28/2017   Lab Results  Component Value Date   LDLCALC 120 (H) 11/28/2017   Lab Results  Component Value Date   TRIG 118 11/28/2017   Lab Results  Component Value Date   CHOLHDL 4.3 11/28/2017   Lab Results  Component Value Date   HGBA1C 6.0 (A) 11/26/2017      Assessment & Plan:   Problem List Items Addressed This Visit      Cardiovascular and Mediastinum   HYPERTENSION, BENIGN - Primary    BP is much better. Optimal would be less than 130 and we discussed that we could consider increase amlodipine to 10mg  or continue to work on diet and weight loss. She has already lost 3 lbs.  Encouraged her to keep up the good work. Has home BP cuff so will check daily for next 2 weeks and let me know what her pressure are doing.       Relevant Medications   amLODipine (NORVASC) 5 MG tablet   lisinopril-hydrochlorothiazide (ZESTORETIC) 20-12.5 MG tablet   Other Relevant Orders   BASIC METABOLIC PANEL WITH GFR   TSH   Hemoglobin A1c   Fe+TIBC+Fer   B12     Endocrine   IFG (impaired fasting glucose)    Due to recheck A1C.       Relevant Orders   BASIC METABOLIC PANEL WITH GFR   TSH   Hemoglobin A1c   Fe+TIBC+Fer   B12     Genitourinary   Chronic kidney disease (CKD) stage G3b/A1, moderately decreased glomerular filtration rate (GFR) between 30-44 mL/min/1.73 square meter and albuminuria creatinine ratio less than 30 mg/g (HCC)    Following every 6 months.  Overdue.  Labs ordered today.      Relevant Orders   BASIC METABOLIC PANEL WITH GFR   TSH   Hemoglobin  A1c   Fe+TIBC+Fer   B12    Other Visit Diagnoses    Fatigue, unspecified type       Relevant Orders   BASIC METABOLIC PANEL WITH GFR   TSH   Hemoglobin A1c   Fe+TIBC+Fer   B12   Essential hypertension       Relevant Medications   amLODipine (NORVASC) 5 MG tablet   lisinopril-hydrochlorothiazide (ZESTORETIC) 20-12.5 MG tablet     Fatigue-unclear etiology.  It sounds like she did not sleep well this weekend so that certainly could be contributing but we will check for anemia as well.  I do find it interesting that she was unable to donate blood because her hemoglobin was low.  So we will check a CBC as well as iron and ferritin.  She plans on scheduling a physical sometime next month.  Meds ordered this encounter  Medications  . amLODipine (NORVASC) 5 MG tablet    Sig: Take 1 tablet (5 mg total) by mouth daily.    Dispense:  90 tablet    Refill:  1  . lisinopril-hydrochlorothiazide (ZESTORETIC) 20-12.5 MG tablet    Sig: Take 1 tablet by mouth daily.    Dispense:  90 tablet  Refill:  1    Follow-up: Return in about 3 months (around 12/24/2018) for Hypertension.    Nani Gasser, MD

## 2018-09-23 NOTE — Assessment & Plan Note (Signed)
BP is much better. Optimal would be less than 130 and we discussed that we could consider increase amlodipine to 10mg  or continue to work on diet and weight loss. She has already lost 3 lbs.  Encouraged her to keep up the good work. Has home BP cuff so will check daily for next 2 weeks and let me know what her pressure are doing.

## 2018-09-24 LAB — IRON,TIBC AND FERRITIN PANEL
%SAT: 23 % (calc) (ref 16–45)
Ferritin: 160 ng/mL (ref 16–288)
Iron: 70 ug/dL (ref 45–160)
TIBC: 308 mcg/dL (calc) (ref 250–450)

## 2018-09-24 LAB — BASIC METABOLIC PANEL WITH GFR
BUN/Creatinine Ratio: 17 (calc) (ref 6–22)
BUN: 18 mg/dL (ref 7–25)
CO2: 28 mmol/L (ref 20–32)
Calcium: 10 mg/dL (ref 8.6–10.4)
Chloride: 99 mmol/L (ref 98–110)
Creat: 1.09 mg/dL — ABNORMAL HIGH (ref 0.60–0.93)
GFR, Est African American: 58 mL/min/{1.73_m2} — ABNORMAL LOW (ref >=60)
GFR, Est Non African American: 50 mL/min/{1.73_m2} — ABNORMAL LOW (ref >=60)
Glucose, Bld: 108 mg/dL — ABNORMAL HIGH (ref 65–99)
Potassium: 4.3 mmol/L (ref 3.5–5.3)
Sodium: 138 mmol/L (ref 135–146)

## 2018-09-24 LAB — HEMOGLOBIN A1C
Hgb A1c MFr Bld: 6 % of total Hgb — ABNORMAL HIGH (ref <5.7)
Mean Plasma Glucose: 126 (calc)
eAG (mmol/L): 7 (calc)

## 2018-09-24 LAB — TSH: TSH: 1.17 mIU/L (ref 0.40–4.50)

## 2018-09-24 LAB — VITAMIN B12: Vitamin B-12: 535 pg/mL (ref 200–1100)

## 2018-10-07 ENCOUNTER — Other Ambulatory Visit: Payer: Self-pay

## 2018-10-08 ENCOUNTER — Ambulatory Visit (INDEPENDENT_AMBULATORY_CARE_PROVIDER_SITE_OTHER): Payer: Medicare Other | Admitting: Women's Health

## 2018-10-08 ENCOUNTER — Encounter: Payer: Self-pay | Admitting: Women's Health

## 2018-10-08 VITALS — BP 126/80 | Ht 66.0 in | Wt 224.0 lb

## 2018-10-08 DIAGNOSIS — Z01419 Encounter for gynecological examination (general) (routine) without abnormal findings: Secondary | ICD-10-CM | POA: Diagnosis not present

## 2018-10-08 NOTE — Patient Instructions (Signed)
Health Maintenance After Age 73 After age 73, you are at a higher risk for certain long-term diseases and infections as well as injuries from falls. Falls are a major cause of broken bones and head injuries in people who are older than age 73. Getting regular preventive care can help to keep you healthy and well. Preventive care includes getting regular testing and making lifestyle changes as recommended by your health care provider. Talk with your health care provider about:  Which screenings and tests you should have. A screening is a test that checks for a disease when you have no symptoms.  A diet and exercise plan that is right for you. What should I know about screenings and tests to prevent falls? Screening and testing are the best ways to find a health problem early. Early diagnosis and treatment give you the best chance of managing medical conditions that are common after age 73. Certain conditions and lifestyle choices may make you more likely to have a fall. Your health care provider may recommend:  Regular vision checks. Poor vision and conditions such as cataracts can make you more likely to have a fall. If you wear glasses, make sure to get your prescription updated if your vision changes.  Medicine review. Work with your health care provider to regularly review all of the medicines you are taking, including over-the-counter medicines. Ask your health care provider about any side effects that may make you more likely to have a fall. Tell your health care provider if any medicines that you take make you feel dizzy or sleepy.  Osteoporosis screening. Osteoporosis is a condition that causes the bones to get weaker. This can make the bones weak and cause them to break more easily.  Blood pressure screening. Blood pressure changes and medicines to control blood pressure can make you feel dizzy.  Strength and balance checks. Your health care provider may recommend certain tests to check your  strength and balance while standing, walking, or changing positions.  Foot health exam. Foot pain and numbness, as well as not wearing proper footwear, can make you more likely to have a fall.  Depression screening. You may be more likely to have a fall if you have a fear of falling, feel emotionally low, or feel unable to do activities that you used to do.  Alcohol use screening. Using too much alcohol can affect your balance and may make you more likely to have a fall. What actions can I take to lower my risk of falls? General instructions  Talk with your health care provider about your risks for falling. Tell your health care provider if: ? You fall. Be sure to tell your health care provider about all falls, even ones that seem minor. ? You feel dizzy, sleepy, or off-balance.  Take over-the-counter and prescription medicines only as told by your health care provider. These include any supplements.  Eat a healthy diet and maintain a healthy weight. A healthy diet includes low-fat dairy products, low-fat (lean) meats, and fiber from whole grains, beans, and lots of fruits and vegetables. Home safety  Remove any tripping hazards, such as rugs, cords, and clutter.  Install safety equipment such as grab bars in bathrooms and safety rails on stairs.  Keep rooms and walkways well-lit. Activity   Follow a regular exercise program to stay fit. This will help you maintain your balance. Ask your health care provider what types of exercise are appropriate for you.  If you need a cane or   walker, use it as recommended by your health care provider.  Wear supportive shoes that have nonskid soles. Lifestyle  Do not drink alcohol if your health care provider tells you not to drink.  If you drink alcohol, limit how much you have: ? 0-1 drink a day for women. ? 0-2 drinks a day for men.  Be aware of how much alcohol is in your drink. In the [place] one drink equals one typical bottle of beer (12  oz), one-half glass of wine (5 oz), or one shot of hard liquor (1 oz).  Do not use any products that contain nicotine or tobacco, such as cigarettes and e-cigarettes. If you need help quitting, ask your health care provider. Summary  Having a healthy lifestyle and getting preventive care can help to protect your health and wellness after age 73.  Screening and testing are the best way to find a health problem early and help you avoid having a fall. Early diagnosis and treatment give you the best chance for managing medical conditions that are more common for people who are older than age 73.  Falls are a major cause of broken bones and head injuries in people who are older than age 73. Take precautions to prevent a fall at home.  Work with your health care provider to learn what changes you can make to improve your health and wellness and to prevent falls. This information is not intended to replace advice given to you by your health care provider. Make sure you discuss any questions you have with your health care provider. Document Released: 12/26/2016 Document Revised: 06/05/2018 Document Reviewed: 12/26/2016 Elsevier Patient Education  2020 Elsevier Inc.  

## 2018-10-08 NOTE — Progress Notes (Signed)
[name] 17-Oct-1945 [id number]    History:    Presents for annual GYN exam with no complaints.  TAH for fibroids on no HRT.  Not sexually active for years husband's health.  Vaccines current.  Normal Pap and mammogram history.  Primary care manages hypertension.  2019 normal DEXA.  History of a pelvic kidney.  Was having problems with weakness in her legs last year that has improved.  Current on vaccines.  Past medical history, past surgical history, family history and social history were all reviewed and documented in the EPIC chart.  Retired Customer service manager.  Sister and a niece with breast cancer.  ROS:  A ROS was performed and pertinent positives and negatives are included.  Exam:  Vitals:   10/08/18 1048  BP: 126/80  Weight: 224 lb (101.6 kg)  Height: 5\' 6"  (1.676 m)   Body mass index is 36.15 kg/m.   General appearance:  Normal Thyroid:  Symmetrical, normal in size, without palpable masses or nodularity. Respiratory  Auscultation:  Clear without wheezing or rhonchi Cardiovascular  Auscultation:  Regular rate, without rubs, murmurs or gallops  Edema/varicosities:  Not grossly evident Abdominal  Soft,nontender, without masses, guarding or rebound.  Liver/spleen:  No organomegaly noted  Hernia:  None appreciated  Skin  Inspection:  Grossly normal   Breasts: Examined lying and sitting. Pendulous, history of a breast reduction    Right: Without masses, retractions, discharge or axillary adenopathy.     Left: Without masses, retractions, discharge or axillary adenopathy. Gentitourinary   Inguinal/mons:  Normal without inguinal adenopathy  External genitalia:  Normal  BUS/Urethra/Skene's glands:  Normal  Vagina:  Normal  Cervix: And uterus absent normal  Adnexa/parametria:     Rt: Without masses or tenderness.   Lt: Without masses or tenderness.  Anus and perineum: Normal  Digital rectal exam: Normal sphincter tone without palpated masses or  tenderness  Assessment/Plan:  73 y.o. MBF G0 presents for annual GYN exam with no complaints.  TAH for fibroids on no HRT Hypertension-primary care manages labs and meds Obesity  Plan: SBEs, continue annual screening mammogram has scheduled.  Continue vitamin D 2000 daily, increase regular cardio and balance type exercise.  Home safety, fall prevention discussed.  Encouraged to decrease calorie/carbs.    [place], 11:01 AM 10/08/2018

## 2018-10-15 ENCOUNTER — Encounter: Payer: Self-pay | Admitting: Family Medicine

## 2018-10-15 ENCOUNTER — Ambulatory Visit (INDEPENDENT_AMBULATORY_CARE_PROVIDER_SITE_OTHER): Payer: Medicare Other | Admitting: Family Medicine

## 2018-10-15 ENCOUNTER — Other Ambulatory Visit: Payer: Self-pay

## 2018-10-15 DIAGNOSIS — Z23 Encounter for immunization: Secondary | ICD-10-CM | POA: Diagnosis not present

## 2018-10-15 DIAGNOSIS — Z0000 Encounter for general adult medical examination without abnormal findings: Secondary | ICD-10-CM

## 2018-10-15 MED ORDER — AMBULATORY NON FORMULARY MEDICATION: 0 refills | Status: DC

## 2018-10-15 NOTE — Progress Notes (Signed)
Subjective:     [name] is a 73 y.o. female and is here for a comprehensive physical exam. The patient reports no problems. She is not actively exercising bc of the heat but says she know she needs to start back.  She has not spirits any recent chest pain or shortness of breath.  She has been very busy lately.  She is able to do most things for herself.  She denies any bladder problems or incontinence.  She does feel safe at home.  She has scheduled her mammogram for next week.  She follows with GYN for her pelvic exam.  Social History   Socioeconomic History  . Marital status: Married    Spouse name: Jomarie LongsJoseph  . Number of children: 0  . Years of education: 4114  . Highest education level: 12th grade  Occupational History  . Occupation: retired    Comment: [place] of [employer]  Social Needs  . Financial resource strain: Not hard at all  . Food insecurity    Worry: Never true    Inability: Never true  . Transportation needs    Medical: No    Non-medical: No  Tobacco Use  . Smoking status: Never Smoker  . Smokeless tobacco: Never Used  Substance and Sexual Activity  . Alcohol use: No    Alcohol/week: 0.0 standard drinks  . Drug use: No  . Sexual activity: Not Currently    Partners: Male    Comment: HYST-1st intercourse 73 yo-5 partners  Lifestyle  . Physical activity    Days per week: 1 day    Minutes per session: 40 min  . Stress: Not at all  Relationships  . Social connections    Talks on phone: More than three times a week    Gets together: Once a week    Attends religious service: More than 4 times per year    Active member of club or organization: Yes    Attends meetings of clubs or organizations: More than 4 times per year    Relationship status: Married  . Intimate partner violence    Fear of current or ex partner: No    Emotionally abused: No    Physically abused: No    Forced sexual activity: No  Other Topics Concern  . Not on file  Social  History Narrative   Exercising some 1 day a week, [facility] and does aerobics in the water and walks on treadmill. Discussed with patient to increase frequency of exercise a week.   Health Maintenance  Topic Date Due  . TETANUS/TDAP  02/26/2017  . INFLUENZA VACCINE  09/27/2018  . MAMMOGRAM  07/05/2019  . COLONOSCOPY  12/05/2021  . DEXA SCAN  Completed  . Hepatitis C Screening  Completed  . PNA vac Low Risk Adult  Completed    The following portions of the patient's history were reviewed and updated as appropriate: allergies, current medications, past family history, past medical history, past social history, past surgical history and problem list.  Review of Systems A comprehensive review of systems was negative.   Objective:    BP 131/60   Pulse 76   Ht 5\' 6"  (1.676 m)   Wt 221 lb (100.2 kg)   SpO2 99%   BMI 35.67 kg/m  General appearance: alert, cooperative and appears stated age Head: Normocephalic, without obvious abnormality, atraumatic Eyes: conj clear, EOMi, PEERLA Ears: normal TM's and external ear canals both ears Nose: Nares normal. Septum midline. Mucosa  normal. No drainage or sinus tenderness. Throat: lips, mucosa, and tongue normal; teeth and gums normal Neck: no adenopathy, no carotid bruit, no JVD, supple, symmetrical, trachea midline and thyroid not enlarged, symmetric, no tenderness/mass/nodules Back: symmetric, no curvature. ROM normal. No CVA tenderness. Lungs: clear to auscultation bilaterally Heart: regular rate and rhythm, S1, S2 normal, no murmur, click, rub or gallop Abdomen: soft, non-tender; bowel sounds normal; no masses,  no organomegaly Extremities: extremities normal, atraumatic, no cyanosis or edema Pulses: 2+ and symmetric Skin: Skin color, texture, turgor normal. No rashes or lesions Lymph nodes: Cervical, supraclavicular, and axillary nodes normal. Neurologic: Alert and oriented X 3, normal strength and tone. Normal symmetric reflexes.  Normal coordination and gait    Assessment:    Healthy female exam.      Plan:     See After Visit Summary for Counseling Recommendations   Keep up a regular exercise program and make sure you are eating a healthy diet Try to eat 4 servings of dairy a day, or if you are lactose intolerant take a calcium with vitamin D daily.  Your vaccines are up to date.  Prescription given for Tdap and shingles vaccine to get those done at the pharmacy. Flu vaccine given here in the office. She is up-to-date on her lab work. She has Medicare wellness scheduled in September.

## 2018-10-15 NOTE — Progress Notes (Signed)
Mammogram scheduled for next week. She was seen by GYN on 10/08/2018.

## 2018-10-15 NOTE — Patient Instructions (Signed)
Preventive Care 38 Years and Older, Female Preventive care refers to lifestyle choices and visits with your health care provider that can promote health and wellness. This includes:  A yearly physical exam. This is also called an annual well check.  Regular dental and eye exams.  Immunizations.  Screening for certain conditions.  Healthy lifestyle choices, such as diet and exercise. What can I expect for my preventive care visit? Physical exam Your health care provider will check:  Height and weight. These may be used to calculate body mass index (BMI), which is a measurement that tells if you are at a healthy weight.  Heart rate and blood pressure.  Your skin for abnormal spots. Counseling Your health care provider may ask you questions about:  Alcohol, tobacco, and drug use.  Emotional well-being.  Home and relationship well-being.  Sexual activity.  Eating habits.  History of falls.  Memory and ability to understand (cognition).  Work and work Statistician.  Pregnancy and menstrual history. What immunizations do I need?  Influenza (flu) vaccine  This is recommended every year. Tetanus, diphtheria, and pertussis (Tdap) vaccine  You may need a Td booster every 10 years. Varicella (chickenpox) vaccine  You may need this vaccine if you have not already been vaccinated. Zoster (shingles) vaccine  You may need this after age 73. Pneumococcal conjugate (PCV13) vaccine  One dose is recommended after age 73. Pneumococcal polysaccharide (PPSV23) vaccine  One dose is recommended after age 72. Measles, mumps, and rubella (MMR) vaccine  You may need at least one dose of MMR if you were born in 1957 or later. You may also need a second dose. Meningococcal conjugate (MenACWY) vaccine  You may need this if you have certain conditions. Hepatitis A vaccine  You may need this if you have certain conditions or if you travel or work in places where you may be exposed  to hepatitis A. Hepatitis B vaccine  You may need this if you have certain conditions or if you travel or work in places where you may be exposed to hepatitis B. Haemophilus influenzae type b (Hib) vaccine  You may need this if you have certain conditions. You may receive vaccines as individual doses or as more than one vaccine together in one shot (combination vaccines). Talk with your health care provider about the risks and benefits of combination vaccines. What tests do I need? Blood tests  Lipid and cholesterol levels. These may be checked every 5 years, or more frequently depending on your overall health.  Hepatitis C test.  Hepatitis B test. Screening  Lung cancer screening. You may have this screening every year starting at age 73 if you have a 30-pack-year history of smoking and currently smoke or have quit within the past 15 years.  Colorectal cancer screening. All adults should have this screening starting at age 73 and continuing until age 15. Your health care provider may recommend screening at age 73 if you are at increased risk. You will have tests every 1-10 years, depending on your results and the type of screening test.  Diabetes screening. This is done by checking your blood sugar (glucose) after you have not eaten for a while (fasting). You may have this done every 1-3 years.  Mammogram. This may be done every 1-2 years. Talk with your health care provider about how often you should have regular mammograms.  BRCA-related cancer screening. This may be done if you have a family history of breast, ovarian, tubal, or peritoneal cancers.  Other tests  Sexually transmitted disease (STD) testing.  Bone density scan. This is done to screen for osteoporosis. You may have this done starting at age 73. Follow these instructions at home: Eating and drinking  Eat a diet that includes fresh fruits and vegetables, whole grains, lean protein, and low-fat dairy products. Limit  your intake of foods with high amounts of sugar, saturated fats, and salt.  Take vitamin and mineral supplements as recommended by your health care provider.  Do not drink alcohol if your health care provider tells you not to drink.  If you drink alcohol: ? Limit how much you have to 0-1 drink a day. ? Be aware of how much alcohol is in your drink. In the [place] one drink equals one 12 oz bottle of beer (355 mL), one 5 oz glass of wine (148 mL), or one 1 oz glass of hard liquor (44 mL). Lifestyle  Take daily care of your teeth and gums.  Stay active. Exercise for at least 30 minutes on 5 or more days each week.  Do not use any products that contain nicotine or tobacco, such as cigarettes, e-cigarettes, and chewing tobacco. If you need help quitting, ask your health care provider.  If you are sexually active, practice safe sex. Use a condom or other form of protection in order to prevent STIs (sexually transmitted infections).  Talk with your health care provider about taking a low-dose aspirin or statin. What's next?  Go to your health care provider once a year for a well check visit.  Ask your health care provider how often you should have your eyes and teeth checked.  Stay up to date on all vaccines. This information is not intended to replace advice given to you by your health care provider. Make sure you discuss any questions you have with your health care provider. Document Released: 03/11/2015 Document Revised: 02/06/2018 Document Reviewed: 02/06/2018 Elsevier Patient Education  2020 [employer].

## 2018-10-27 ENCOUNTER — Other Ambulatory Visit: Payer: Self-pay

## 2018-10-27 ENCOUNTER — Ambulatory Visit
Admission: RE | Admit: 2018-10-27 | Discharge: 2018-10-27 | Disposition: A | Discharge status: Home / Self Care | Payer: Medicare Other | Source: Ambulatory Visit | Attending: Family Medicine | Admitting: Family Medicine

## 2018-10-27 DIAGNOSIS — Z1231 Encounter for screening mammogram for malignant neoplasm of breast: Secondary | ICD-10-CM | POA: Diagnosis not present

## 2018-11-18 ENCOUNTER — Ambulatory Visit: Payer: Medicare Other

## 2018-12-03 ENCOUNTER — Encounter: Payer: Self-pay | Admitting: Gynecology

## 2018-12-15 ENCOUNTER — Ambulatory Visit: Payer: Medicare Other

## 2018-12-17 ENCOUNTER — Telehealth: Payer: Self-pay

## 2018-12-17 NOTE — Telephone Encounter (Signed)
[name] states her and [name] is getting new life insurance. She just wanted you to know so you don't question it when they request records.

## 2018-12-17 NOTE — Telephone Encounter (Signed)
OK 

## 2018-12-21 NOTE — Progress Notes (Signed)
Subjective:   [name] is a 73 y.o. female who presents for Medicare Annual (Subsequent) preventive examination.  Review of Systems:  No ROS.  Medicare Wellness Virtual Visit.  Visual/audio telehealth visit, UTA vital signs.   See social history for additional risk factors.    Cardiac Risk Factors include: advanced age (>17men, >2 women);hypertension Sleep patterns: Getting at least 8 hours of sleep a night. Wakes up 3-4 times a night to void.Feels refreshed and ready for the day upon wakening.  Home Safety/Smoke Alarms: Feels safe in home. Smoke alarms in place.  Living environment;Lives with husband in a 2 story home and stairs have hand rails on them. Shower is a step over tub combo and no grab bars in place. Seat Belt Safety/Bike Helmet: Wears seat belt.   Female:   Pap-  Aged out     Mammo- UTD      Dexa scan-  UTD      CCS- UTD     Objective:     Vitals: There were no vitals taken for this visit.  There is no height or weight on file to calculate BMI.  Advanced Directives 12/22/2018 11/13/2017 11/12/2017 06/10/2014  Does Patient Have a Medical Advance Directive? No;Yes No No No  Type of Diplomatic Services operational officer;Living will - - -  Does patient want to make changes to medical advance directive? No - Patient declined - - -  Copy of Healthcare Power of Attorney in Chart? No - copy requested - - -  Would patient like information on creating a medical advance directive? No - Patient declined No - Patient declined Yes (MAU/Ambulatory/Procedural Areas - Information given) Yes - Educational materials given    Tobacco Social History   Tobacco Use  Smoking Status Never Smoker  Smokeless Tobacco Never Used     Counseling given: Not Answered   Clinical Intake:  Pre-visit preparation completed: Yes  Pain : No/denies pain     Nutritional Risks: None Diabetes: No  How often do you need to have someone help you when you read instructions,  pamphlets, or other written materials from your doctor or pharmacy?: 1 - Never What is the last grade level you completed in school?: 13  Interpreter Needed?: No  Information entered by :: Carolin Sicks, LPN  Past Medical History:  Diagnosis Date  . Diverticulosis   . Hiatal hernia   . Hyperlipidemia    borderline  . Hypertension   . Spinal stenosis 2019   Past Surgical History:  Procedure Laterality Date  . ABDOMINAL HYSTERECTOMY     TAH Pt. unsure if BSO  . BREAST REDUCTION SURGERY  1994  . BREAST SURGERY     Reduction  . LIPOSUCTION    . REDUCTION MAMMAPLASTY Bilateral 1994   Family History  Problem Relation Age of Onset  . Breast cancer Sister 76       COD breast cancer and pneumonia  . Hypertension Sister   . Heart attack Father   . Hypertension Father   . Hypertension Mother   . Kidney disease Mother   . Breast cancer Other        ages 33 and 51--2 nieces  . Hypertension Brother   . Colon cancer Neg Hx   . Esophageal cancer Neg Hx   . Rectal cancer Neg Hx   . Stomach cancer Neg Hx    Social History   Socioeconomic History  . Marital status: Married    Spouse name: [name]  .  Number of children: 0  . Years of education: 42  . Highest education level: 12th grade  Occupational History  . Occupation: retired    Comment: land of [employer]  Social Needs  . Financial resource strain: Not hard at all  . Food insecurity    Worry: Never true    Inability: Never true  . Transportation needs    Medical: No    Non-medical: No  Tobacco Use  . Smoking status: Never Smoker  . Smokeless tobacco: Never Used  Substance and Sexual Activity  . Alcohol use: No    Alcohol/week: 0.0 standard drinks  . Drug use: No  . Sexual activity: Not Currently    Partners: Male    Comment: HYST-1st intercourse 32 yo-5 partners  Lifestyle  . Physical activity    Days per week: 2 days    Minutes per session: 30 min  . Stress: Not at all  Relationships  . Social  connections    Talks on phone: More than three times a week    Gets together: Once a week    Attends religious service: More than 4 times per year    Active member of club or organization: Yes    Attends meetings of clubs or organizations: More than 4 times per year    Relationship status: Married  Other Topics Concern  . Not on file  Social History Narrative   Exercising some 2 day a week,   walks on treadmill. Discussed with patient to increase frequency of exercise a week.    Outpatient Encounter Medications as of 12/22/2018  Medication Sig  . AMBULATORY NON FORMULARY MEDICATION Medication Name: Tdap IM x a  . AMBULATORY NON FORMULARY MEDICATION Medication Name: zostavax IM x 1  . amLODipine (NORVASC) 5 MG tablet Take 1 tablet (5 mg total) by mouth daily.  Marland Kitchen aspirin 81 MG tablet Take 81 mg by mouth daily.    . Calcium Carbonate-Vitamin D (CALCIUM 600+D) 600-200 MG-UNIT TABS Take by mouth.    Marland Kitchen lisinopril-hydrochlorothiazide (ZESTORETIC) 20-12.5 MG tablet Take 1 tablet by mouth daily.  Marland Kitchen MAGNESIUM PO Take 2 tablets by mouth daily.  . Multiple Vitamins-Minerals (MULTIVITAMIN PO) Take by mouth.  . Omega-3 Fatty Acids (FISH OIL) 1000 MG CPDR Take by mouth.    Marland Kitchen OVER THE COUNTER MEDICATION Curamin Extra Strength PO  . Probiotic Product (PROBIOTIC DAILY PO) Take by mouth.  Marland Kitchen VITAMIN D PO Take 1 tablet by mouth daily.  . diclofenac sodium (VOLTAREN) 1 % GEL Apply 4 g topically 4 (four) times daily. To affected joint. (Patient not taking: Reported on 12/22/2018)   No facility-administered encounter medications on file as of 12/22/2018.     Activities of Daily Living In your present state of health, do you have any difficulty performing the following activities: 12/22/2018  Hearing? N  Vision? N  Difficulty concentrating or making decisions? N  Walking or climbing stairs? N  Dressing or bathing? N  Doing errands, shopping? N  Preparing Food and eating ? N  Using the Toilet? N  In  the past six months, have you accidently leaked urine? N  Do you have problems with loss of bowel control? N  Managing your Medications? N  Managing your Finances? N  Housekeeping or managing your Housekeeping? N  Some recent data might be hidden    Patient Care Team: Hali Marry, MD as PCP - General Annamaria Boots Candiss Norse, NP as Nurse Practitioner (Obstetrics and Gynecology)  Assessment:   This is a routine wellness examination for Disautel.Physical assessment deferred to PCP.   Exercise Activities and Dietary recommendations Current Exercise Habits: Home exercise routine, Type of exercise: treadmill, Time (Minutes): 30, Frequency (Times/Week): 2, Weekly Exercise (Minutes/Week): 60, Intensity: Mild, Exercise limited by: None identified Diet  Eats a healthy diet of frutis, vegetables and proteins. Drinks a lot of water daily. Watches portion sizes. Watching salt intake Breakfast: oatmeal,, eggs and bacon Lunch: sandwich or salad Dinner: Meat and vegetables      Goals    . DIET - INCREASE WATER INTAKE (pt-stated)     Increase water intake to 4-5 bottles a day.    . Exercise 3x per week (30 min per time)     Plans to start exercising with JB and/or her sister.      . Exercise 3x per week (30 min per time)     Increase exercise routine to 3 times a week for 30 minutes at a time.    . Patient Stated (pt-stated)     Patient stated to eat more fruit and exercise more a week.    . Weight (lb) < 200 lb (90.7 kg) (pt-stated)     Exercise more. And will try and loose weight within the next year.       Fall Risk Fall Risk  12/22/2018 10/15/2018 11/12/2017 10/22/2016 10/21/2015  Falls in the past year? 0 0 No No No  Comment - - - - Emmi Telephone Survey: data to providers prior to load  Number falls in past yr: 0 0 - - -  Injury with Fall? 0 0 - - -  Follow up Falls prevention discussed - - - -   Is the patient's home free of loose throw rugs in walkways, pet beds, electrical  cords, etc?   yes      Grab bars in the bathroom? no      Handrails on the stairs?   yes      Adequate lighting?   yes   Depression Screen PHQ 2/9 Scores 12/22/2018 10/15/2018 11/12/2017 04/25/2017  PHQ - 2 Score 0 0 0 0  PHQ- 9 Score - - - 1     Cognitive Function     6CIT Screen 12/22/2018 10/15/2018 11/12/2017  What Year? 0 points 0 points 0 points  What month? 0 points 0 points 0 points  What time? 0 points 0 points 0 points  Count back from 20 0 points 0 points 0 points  Months in reverse 2 points 0 points 2 points  Repeat phrase 0 points 2 points 0 points  Total Score 2 2 2     Immunization History  Administered Date(s) Administered  . Fluad Quad(high Dose 65+) 10/15/2018  . Influenza Split 01/30/2011  . Influenza, High Dose Seasonal PF 11/26/2017  . Influenza,inj,Quad PF,6+ Mos 12/02/2012, 11/17/2013, 12/13/2014, 10/20/2015, 10/22/2016  . Pneumococcal Conjugate-13 06/10/2014  . Pneumococcal Polysaccharide-23 01/30/2011  . Td 02/27/1995, 02/27/2007  . Tdap 11/25/2018  . Zoster Recombinat (Shingrix) 11/25/2018    Screening Tests Health Maintenance  Topic Date Due  . MAMMOGRAM  10/26/2020  . COLONOSCOPY  12/05/2021  . TETANUS/TDAP  11/24/2028  . INFLUENZA VACCINE  Completed  . DEXA SCAN  Completed  . Hepatitis C Screening  Completed  . PNA vac Low Risk Adult  Completed        Plan:    [name] , Thank you for taking time to come for your Medicare Wellness Visit. I appreciate your  ongoing commitment to your health goals. Please review the following plan we discussed and let me know if I can assist you in the future. Please schedule your next medicare wellness visit with me in 1 yr.    These are the goals we discussed: Goals    . DIET - INCREASE WATER INTAKE (pt-stated)     Increase water intake to 4-5 bottles a day.    . Exercise 3x per week (30 min per time)     Plans to start exercising with JB and/or her sister.      . Exercise 3x per week (30  min per time)     Increase exercise routine to 3 times a week for 30 minutes at a time.    . Patient Stated (pt-stated)     Patient stated to eat more fruit and exercise more a week.    . Weight (lb) < 200 lb (90.7 kg) (pt-stated)     Exercise more. And will try and loose weight within the next year.       This is a list of the screening recommended for you and due dates:  Health Maintenance  Topic Date Due  . Mammogram  10/26/2020  . Colon Cancer Screening  12/05/2021  . Tetanus Vaccine  11/24/2028  . Flu Shot  Completed  . DEXA scan (bone density measurement)  Completed  .  Hepatitis C: One time screening is recommended by Center for Disease Control  (CDC) for  adults born from [birth year] through 1965.   Completed  . Pneumonia vaccines  Completed     I have personally reviewed and noted the following in the patient's chart:   . Medical and social history . Use of alcohol, tobacco or illicit drugs  . Current medications and supplements . Functional ability and status . Nutritional status . Physical activity . Advanced directives . List of other physicians . Hospitalizations, surgeries, and ER visits in previous 12 months . Vitals . Screenings to include cognitive, depression, and falls . Referrals and appointments  In addition, I have reviewed and discussed with patient certain preventive protocols, quality metrics, and best practice recommendations. A written personalized care plan for preventive services as well as general preventive health recommendations were provided to patient.     Normand SloopKimberly A Aliou Mealey, LPN  16/10/960410/26/2020

## 2018-12-22 ENCOUNTER — Other Ambulatory Visit: Payer: Self-pay

## 2018-12-22 ENCOUNTER — Ambulatory Visit (INDEPENDENT_AMBULATORY_CARE_PROVIDER_SITE_OTHER): Payer: Medicare Other | Admitting: *Deleted

## 2018-12-22 VITALS — BP 142/90 | HR 75 | Ht 70.0 in | Wt 218.0 lb

## 2018-12-22 DIAGNOSIS — Z0000 Encounter for general adult medical examination without abnormal findings: Secondary | ICD-10-CM | POA: Diagnosis not present

## 2018-12-22 NOTE — Patient Instructions (Addendum)
[name] , Thank you for taking time to come for your Medicare Wellness Visit. I appreciate your ongoing commitment to your health goals. Please review the following plan we discussed and let me know if I can assist you in the future. Please schedule your next medicare wellness visit with me in 1 yr. These are the goals we discussed: Goals    . DIET - INCREASE WATER INTAKE (pt-stated)     Increase water intake to 4-5 bottles a day.    . Exercise 3x per week (30 min per time)     Plans to start exercising with JB and/or her sister.      . Exercise 3x per week (30 min per time)     Increase exercise routine to 3 times a week for 30 minutes at a time.    . Patient Stated (pt-stated)     Patient stated to eat more fruit and exercise more a week.    . Weight (lb) < 200 lb (90.7 kg) (pt-stated)     Exercise more. And will try and loose weight within the next year.

## 2018-12-23 NOTE — Telephone Encounter (Signed)
Estell called to see if you guys have received the insurance forms from [place].

## 2018-12-24 ENCOUNTER — Ambulatory Visit (INDEPENDENT_AMBULATORY_CARE_PROVIDER_SITE_OTHER): Payer: Medicare Other | Admitting: Family Medicine

## 2018-12-24 ENCOUNTER — Other Ambulatory Visit: Payer: Self-pay

## 2018-12-24 ENCOUNTER — Encounter: Payer: Self-pay | Admitting: Family Medicine

## 2018-12-24 VITALS — BP 137/58 | HR 76 | Ht 70.0 in | Wt 218.0 lb

## 2018-12-24 DIAGNOSIS — N1832 Chronic kidney disease, stage 3b: Secondary | ICD-10-CM

## 2018-12-24 DIAGNOSIS — I10 Essential (primary) hypertension: Secondary | ICD-10-CM | POA: Diagnosis not present

## 2018-12-24 DIAGNOSIS — R7301 Impaired fasting glucose: Secondary | ICD-10-CM

## 2018-12-24 NOTE — Telephone Encounter (Signed)
We got forms. Patient aware

## 2018-12-24 NOTE — Assessment & Plan Note (Addendum)
Well controlled.  Recommend get new BP machine for home.  Continue current regimen. Follow up in  6 months.

## 2018-12-24 NOTE — Assessment & Plan Note (Signed)
Well controlled. Continue current regimen. Follow up in  6 months.  

## 2018-12-24 NOTE — Assessment & Plan Note (Signed)
We will continue to monitor renal function every 6 months.  She does avoid NSAIDs.

## 2018-12-24 NOTE — Progress Notes (Signed)
Established Patient Office Visit  Subjective:  Patient ID: [name], female    DOB: 01/05/1946  Age: 73 y.o. MRN: [id number]  CC:  Chief Complaint  Patient presents with  . Hypertension    HPI [name] presents for   Hypertension- Pt denies chest pain, SOB, dizziness, or heart palpitations.  Taking meds as directed w/o problems.  Denies medication side effects.  Does report that her home blood pressure machine has actually been running a little higher its been running in the 140s and 150s but she says it is also old and wonders if it might not be correct.  Impaired fasting glucose-no increased thirst or urination. No symptoms consistent with hypoglycemia.  Feels like she is doing well with dietary choices.  Has not been exercising consistently.  F/U CKD 3 - doing well.  Avoiding NSAIDS.   Past Medical History:  Diagnosis Date  . Diverticulosis   . Hiatal hernia   . Hyperlipidemia    borderline  . Hypertension   . Spinal stenosis 2019    Past Surgical History:  Procedure Laterality Date  . ABDOMINAL HYSTERECTOMY     TAH Pt. unsure if BSO  . BREAST REDUCTION SURGERY  1994  . BREAST SURGERY     Reduction  . LIPOSUCTION    . REDUCTION MAMMAPLASTY Bilateral 1994    Family History  Problem Relation Age of Onset  . Breast cancer Sister 81       COD breast cancer and pneumonia  . Hypertension Sister   . Heart attack Father   . Hypertension Father   . Hypertension Mother   . Kidney disease Mother   . Breast cancer Other        ages 24 and 51--2 nieces  . Hypertension Brother   . Colon cancer Neg Hx   . Esophageal cancer Neg Hx   . Rectal cancer Neg Hx   . Stomach cancer Neg Hx     Social History   Socioeconomic History  . Marital status: Married    Spouse name: Jomarie Longs  . Number of children: 0  . Years of education: 71  . Highest education level: 12th grade  Occupational History  . Occupation: retired    Comment: [employer]  Social Needs  . Financial resource strain: Not hard at all  . Food insecurity    Worry: Never true    Inability: Never true  . Transportation needs    Medical: No    Non-medical: No  Tobacco Use  . Smoking status: Never Smoker  . Smokeless tobacco: Never Used  Substance and Sexual Activity  . Alcohol use: No    Alcohol/week: 0.0 standard drinks  . Drug use: No  . Sexual activity: Not Currently    Partners: Male    Comment: HYST-1st intercourse 59 yo-5 partners  Lifestyle  . Physical activity    Days per week: 2 days    Minutes per session: 30 min  . Stress: Not at all  Relationships  . Social connections    Talks on phone: More than three times a week    Gets together: Once a week    Attends religious service: More than 4 times per year    Active member of club or organization: Yes    Attends meetings of clubs or organizations: More than 4 times per year    Relationship status: Married  . Intimate partner violence    Fear of current or ex  partner: No    Emotionally abused: No    Physically abused: No    Forced sexual activity: No  Other Topics Concern  . Not on file  Social History Narrative   Exercising some 2 day a week,   walks on treadmill. Discussed with patient to increase frequency of exercise a week.    Outpatient Medications Prior to Visit  Medication Sig Dispense Refill  . AMBULATORY NON FORMULARY MEDICATION Medication Name: Tdap IM x a 1 Units 0  . AMBULATORY NON FORMULARY MEDICATION Medication Name: zostavax IM x 1 1 vial 0  . amLODipine (NORVASC) 5 MG tablet Take 1 tablet (5 mg total) by mouth daily. 90 tablet 1  . aspirin 81 MG tablet Take 81 mg by mouth daily.      . Calcium Carbonate-Vitamin D (CALCIUM 600+D) 600-200 MG-UNIT TABS Take by mouth.      Marland Kitchen lisinopril-hydrochlorothiazide (ZESTORETIC) 20-12.5 MG tablet Take 1 tablet by mouth daily. 90 tablet 1  . MAGNESIUM PO Take 2 tablets by mouth daily.    . Multiple Vitamins-Minerals  (MULTIVITAMIN PO) Take by mouth.    . Omega-3 Fatty Acids (FISH OIL) 1000 MG CPDR Take by mouth.      Marland Kitchen OVER THE COUNTER MEDICATION Curamin Extra Strength PO    . Probiotic Product (PROBIOTIC DAILY PO) Take by mouth.    Marland Kitchen VITAMIN D PO Take 1 tablet by mouth daily.    . diclofenac sodium (VOLTAREN) 1 % GEL Apply 4 g topically 4 (four) times daily. To affected joint. (Patient not taking: Reported on 12/22/2018) 100 g 11   No facility-administered medications prior to visit.     Allergies  Allergen Reactions  . Penicillins Rash    REACTION: rash    ROS Review of Systems    Objective:    Physical Exam  Constitutional: She is oriented to person, place, and time. She appears well-developed and well-nourished.  HENT:  Head: Normocephalic and atraumatic.  Cardiovascular: Normal rate, regular rhythm and normal heart sounds.  Pulmonary/Chest: Effort normal and breath sounds normal.  Neurological: She is alert and oriented to person, place, and time.  Skin: Skin is warm and dry.  Psychiatric: She has a normal mood and affect. Her behavior is normal.    BP (!) 137/58   Pulse 76   Ht 5\' 10"  (1.778 m)   Wt 218 lb (98.9 kg)   SpO2 98%   BMI 31.28 kg/m  Wt Readings from Last 3 Encounters:  12/24/18 218 lb (98.9 kg)  12/22/18 218 lb (98.9 kg)  10/15/18 221 lb (100.2 kg)     There are no preventive care reminders to display for this patient.  There are no preventive care reminders to display for this patient.  Lab Results  Component Value Date   TSH 1.17 09/23/2018   Lab Results  Component Value Date   WBC 4.4 04/08/2015   HGB 11.6 (L) 04/08/2015   HCT 35.6 (L) 04/08/2015   MCV 83.2 04/08/2015   PLT 254 04/08/2015   Lab Results  Component Value Date   NA 138 09/23/2018   K 4.3 09/23/2018   CO2 28 09/23/2018   GLUCOSE 108 (H) 09/23/2018   BUN 18 09/23/2018   CREATININE 1.09 (H) 09/23/2018   BILITOT 0.6 11/28/2017   ALKPHOS 94 10/30/2016   AST 20 11/28/2017   ALT  17 11/28/2017   PROT 7.1 11/28/2017   ALBUMIN 4.4 10/30/2016   CALCIUM 10.0 09/23/2018   Lab Results  Component Value Date   CHOL 186 11/28/2017   Lab Results  Component Value Date   HDL 43 (L) 11/28/2017   Lab Results  Component Value Date   LDLCALC 120 (H) 11/28/2017   Lab Results  Component Value Date   TRIG 118 11/28/2017   Lab Results  Component Value Date   CHOLHDL 4.3 11/28/2017   Lab Results  Component Value Date   HGBA1C 6.0 (H) 09/23/2018      Assessment & Plan:   Problem List Items Addressed This Visit      Cardiovascular and Mediastinum   HYPERTENSION, BENIGN - Primary    Well controlled.  Recommend get new BP machine for home.  Continue current regimen. Follow up in  6 months.       Relevant Orders   Lipid panel   COMPLETE METABOLIC PANEL WITH GFR     Endocrine   IFG (impaired fasting glucose)    Well controlled. Continue current regimen. Follow up in  6 months.       Relevant Orders   Lipid panel   COMPLETE METABOLIC PANEL WITH GFR     Genitourinary   Chronic kidney disease (CKD) stage G3b/A1, moderately decreased glomerular filtration rate (GFR) between 30-44 mL/min/1.73 square meter and albuminuria creatinine ratio less than 30 mg/g    We will continue to monitor renal function every 6 months.  She does avoid NSAIDs.      Relevant Orders   Lipid panel   COMPLETE METABOLIC PANEL WITH GFR      No orders of the defined types were placed in this encounter.   Follow-up: Return in about 6 months (around 06/24/2019) for Hypertension and Pre-diabetes .    Nani Gasseratherine , MD

## 2018-12-25 LAB — COMPLETE METABOLIC PANEL WITH GFR
AG Ratio: 1.7 (calc) (ref 1.0–2.5)
ALT: 18 U/L (ref 6–29)
AST: 24 U/L (ref 10–35)
Albumin: 4.4 g/dL (ref 3.6–5.1)
Alkaline phosphatase (APISO): 76 U/L (ref 37–153)
BUN/Creatinine Ratio: 16 (calc) (ref 6–22)
BUN: 16 mg/dL (ref 7–25)
CO2: 29 mmol/L (ref 20–32)
Calcium: 9.5 mg/dL (ref 8.6–10.4)
Chloride: 101 mmol/L (ref 98–110)
Creat: 0.98 mg/dL — ABNORMAL HIGH (ref 0.60–0.93)
GFR, Est African American: 66 mL/min/{1.73_m2} (ref >=60)
GFR, Est Non African American: 57 mL/min/{1.73_m2} — ABNORMAL LOW (ref >=60)
Globulin: 2.6 g/dL (calc) (ref 1.9–3.7)
Glucose, Bld: 111 mg/dL — ABNORMAL HIGH (ref 65–99)
Potassium: 4.2 mmol/L (ref 3.5–5.3)
Sodium: 139 mmol/L (ref 135–146)
Total Bilirubin: 0.5 mg/dL (ref 0.2–1.2)
Total Protein: 7 g/dL (ref 6.1–8.1)

## 2018-12-25 LAB — LIPID PANEL
Cholesterol: 184 mg/dL (ref <200)
HDL: 45 mg/dL — ABNORMAL LOW (ref >=50)
LDL Cholesterol (Calc): 114 mg/dL (calc) — ABNORMAL HIGH
Non-HDL Cholesterol (Calc): 139 mg/dL (calc) — ABNORMAL HIGH (ref <130)
Total CHOL/HDL Ratio: 4.1 (calc) (ref <5.0)
Triglycerides: 141 mg/dL (ref <150)

## 2019-01-13 ENCOUNTER — Telehealth: Payer: Self-pay

## 2019-01-13 NOTE — Telephone Encounter (Signed)
I do not see it either.  Does she remember if she actually had it done?  Sometimes we talk about it but then decide not to her to hold off and then never come back around to it.

## 2019-01-13 NOTE — Telephone Encounter (Signed)
[name] is trying to get life insurance. They are requesting results for a Echo. I don't see results or an order.    See 01/02/2016 office visit.

## 2019-01-13 NOTE — Telephone Encounter (Signed)
She doesn't remember having the Echo.

## 2019-01-13 NOTE — Telephone Encounter (Signed)
[name] called back and states the Underwriter wants Dr Madilyn Fireman to write a letter stating she decided not to proceed with the Echo order.    E-mail to [email or web address]

## 2019-01-14 ENCOUNTER — Encounter: Payer: Self-pay | Admitting: *Deleted

## 2019-01-14 NOTE — Telephone Encounter (Signed)
Letter written. Given to Rutland T to email.[name], [name], CMA

## 2019-03-24 ENCOUNTER — Ambulatory Visit: Payer: Medicare Other

## 2019-04-04 ENCOUNTER — Ambulatory Visit: Payer: Medicare Other

## 2019-04-08 ENCOUNTER — Ambulatory Visit: Payer: Medicare Other | Admitting: Sports Medicine

## 2019-04-20 ENCOUNTER — Ambulatory Visit: Payer: Medicare Other | Admitting: Family Medicine

## 2019-04-22 ENCOUNTER — Encounter: Payer: Self-pay | Admitting: Physician Assistant

## 2019-04-22 ENCOUNTER — Other Ambulatory Visit: Payer: Self-pay

## 2019-04-22 ENCOUNTER — Ambulatory Visit (INDEPENDENT_AMBULATORY_CARE_PROVIDER_SITE_OTHER): Payer: Medicare Other | Admitting: Physician Assistant

## 2019-04-22 VITALS — BP 156/71 | HR 85 | Ht 70.0 in | Wt 215.0 lb

## 2019-04-22 DIAGNOSIS — I10 Essential (primary) hypertension: Secondary | ICD-10-CM | POA: Diagnosis not present

## 2019-04-22 DIAGNOSIS — L723 Sebaceous cyst: Secondary | ICD-10-CM | POA: Diagnosis not present

## 2019-04-22 NOTE — Progress Notes (Signed)
   Subjective:    Patient ID: [name], female    DOB: February 20, 1946, 74 y.o.   MRN: [id number]  HPI  Pt is a 74 yo female with hx of sebaceous cyst who presents to the clinic with cyst on her left upper chest wall. It irritates her and keeps growing. Noticed for at least 2 months. No tenderness, redness, warmth, swelling or pain. She has had many removed before.   .. Active Ambulatory Problems    Diagnosis Date Noted  . Hyperlipidemia 01/06/2008  . HYPERTENSION, BENIGN 01/06/2008  . Pelvic kidney 07/30/2012  . Lumbar degenerative disc disease 04/07/2013  . Left shoulder pain 07/27/2013  . Primary osteoarthritis of both knees 07/27/2013  . Chronic kidney disease (CKD) stage G3b/A1, moderately decreased glomerular filtration rate (GFR) between 30-44 mL/min/1.73 square meter and albuminuria creatinine ratio less than 30 mg/g 06/11/2014  . IFG (impaired fasting glucose) 06/11/2014  . Primary osteoarthritis of right hip 07/29/2018   Resolved Ambulatory Problems    Diagnosis Date Noted  . KNEE PAIN, RIGHT 09/13/2009  . Encounter for Medicare annual wellness exam 10/20/2015  . Needs flu shot 10/20/2015  . Infected sebaceous cyst 12/07/2015  . Spinal stenosis of lumbar region with neurogenic claudication 05/05/2016   Past Medical History:  Diagnosis Date  . Diverticulosis   . Hiatal hernia   . Hypertension   . Spinal stenosis 2019     Review of Systems See HPI.     Objective:   Physical Exam Vitals reviewed.  Skin:    Comments: 1cm by 1cm sebaceous cyst of left upper chest wall.   Neurological:     Mental Status: She is alert.           Assessment & Plan:  Marland KitchenMarland KitchenRenita was seen today for cyst.  Diagnoses and all orders for this visit:  Sebaceous cyst   Sebaceous Cyst Excision Procedure Note  Pre-operative Diagnosis: Epidermal cyst  Post-operative Diagnosis: same  Locations: left chest wall  Indications: irritation  Anesthesia: Lidocaine 1% with  epinephrine without added sodium bicarbonate  Procedure Details  History of allergy to iodine: no  Patient informed of the risks (including bleeding and infection) and benefits of the  procedure and Verbal informed consent obtained.  The lesion and surrounding area was given a sterile prep using chlorhexidine and draped in the usual sterile fashion. An incision was made over the cyst, which was dissected free of the surrounding tissue and removed.  The cyst was filled with typical sebaceous material.  The wound was closed with 4-0 Prolene using simple interrupted stitches. Antibiotic ointment and a sterile dressing applied.  The specimen was not sent for pathologic examination. The patient tolerated the procedure well.  EBL: scant  Findings: sebaceous cyst.  Condition: Stable  Complications: none.  Plan: 1. Instructed to keep the wound dry and covered for 24-48h and clean thereafter. 2. Warning signs of infection were reviewed.   3. Recommended that the patient use OTC acetaminophen as needed for pain.  4. Return for suture removal in 1 week.  Elevated BP today. Could be due to procedure. Recheck in 1 week at recheck.

## 2019-04-22 NOTE — Patient Instructions (Signed)
Epidermal Cyst Removal, Care After This sheet gives you information about how to care for yourself after your procedure. Your health care provider may also give you more specific instructions. If you have problems or questions, contact your health care provider. What can I expect after the procedure? After the procedure, it is common to have:  Soreness in the area where your cyst was removed.  Tightness or itchiness from the stitches (sutures) in your skin. Follow these instructions at home: Medicines  Take over-the-counter and prescription medicines only as told by your health care provider.  If you were prescribed an antibiotic medicine or ointment, take or apply it as told by your health care provider. Do not stop using the antibiotic even if you start to feel better. Incision care   Follow instructions from your health care provider about how to take care of your incision. Make sure you: ? Wash your hands with soap and water before you change your bandage (dressing). If soap and water are not available, use hand sanitizer. ? Change your dressing as told by your health care provider. ? Leave sutures, skin glue, or adhesive strips in place. These skin closures may need to stay in place for 1-2 weeks or longer. If adhesive strip edges start to loosen and curl up, you may trim the loose edges. Do not remove adhesive strips completely unless your health care provider tells you to do that.  Keep the dressingdry until your health care provider says that it can be removed.  After your dressing is off, check your incision area every day for signs of infection. Check for: ? Redness, swelling, or pain. ? Fluid or blood. ? Warmth. ? Pus or a bad smell. General instructions  Do not take baths, swim, or use a hot tub until your health care provider approves. Ask your health care provider if you may take showers. You may only be allowed to take sponge baths.  Your health care provider may ask  you to avoid contact sports or activities that take a lot of effort. Do not do anything that stretches or puts pressure on your incision.  You can return to your normal diet.  Keep all follow-up visits as told by your health care provider. This is important. Contact a health care provider if:  You have a fever.  You have redness, swelling, or pain in the incision area.  You have fluid or blood coming from your incision.  You have pus or a bad smell coming from your incision.  Your incision feels warm to the touch.  Your cyst grows back. Summary  After the procedure, it is common to have soreness in the area where your cyst was removed.  Take or apply over-the-counter and prescription medicines only as told by your health care provider.  Follow instructions from your health care provider about how to take care of your incision. This information is not intended to replace advice given to you by your health care provider. Make sure you discuss any questions you have with your health care provider. Document Revised: 06/04/2017 Document Reviewed: 12/06/2016 Elsevier Patient Education  2020 Elsevier Inc.  

## 2019-04-29 ENCOUNTER — Encounter: Payer: Self-pay | Admitting: Physician Assistant

## 2019-04-29 ENCOUNTER — Other Ambulatory Visit: Payer: Self-pay

## 2019-04-29 ENCOUNTER — Ambulatory Visit (INDEPENDENT_AMBULATORY_CARE_PROVIDER_SITE_OTHER): Payer: Medicare Other | Admitting: Physician Assistant

## 2019-04-29 VITALS — BP 168/73 | HR 80 | Ht 70.0 in | Wt 215.0 lb

## 2019-04-29 DIAGNOSIS — I10 Essential (primary) hypertension: Secondary | ICD-10-CM

## 2019-04-29 DIAGNOSIS — L723 Sebaceous cyst: Secondary | ICD-10-CM

## 2019-04-29 NOTE — Progress Notes (Signed)
   Subjective:    Patient ID: [name], female    DOB: 12-Apr-1945, 74 y.o.   MRN: [id number]  HPI Pt is a 74 yo female who returns to clinic 1 week follow up for sebaceous cyst removal.   .. Active Ambulatory Problems    Diagnosis Date Noted  . Hyperlipidemia 01/06/2008  . HYPERTENSION, BENIGN 01/06/2008  . Pelvic kidney 07/30/2012  . Lumbar degenerative disc disease 04/07/2013  . Left shoulder pain 07/27/2013  . Primary osteoarthritis of both knees 07/27/2013  . Chronic kidney disease (CKD) stage G3b/A1, moderately decreased glomerular filtration rate (GFR) between 30-44 mL/min/1.73 square meter and albuminuria creatinine ratio less than 30 mg/g 06/11/2014  . IFG (impaired fasting glucose) 06/11/2014  . Primary osteoarthritis of right hip 07/29/2018   Resolved Ambulatory Problems    Diagnosis Date Noted  . KNEE PAIN, RIGHT 09/13/2009  . Encounter for Medicare annual wellness exam 10/20/2015  . Needs flu shot 10/20/2015  . Infected sebaceous cyst 12/07/2015  . Spinal stenosis of lumbar region with neurogenic claudication 05/05/2016   Past Medical History:  Diagnosis Date  . Diverticulosis   . Hiatal hernia   . Hypertension   . Spinal stenosis 2019      Review of Systems  All other systems reviewed and are negative.      Objective:   Physical Exam Vitals reviewed.  Constitutional:      Appearance: Normal appearance.  HENT:     Head: Normocephalic.  Cardiovascular:     Rate and Rhythm: Normal rate and regular rhythm.     Pulses: Normal pulses.  Pulmonary:     Effort: Pulmonary effort is normal.  Skin:    Comments: Well healed incision. Slight redness and swelling. No warmth or discharge.   Neurological:     General: No focal deficit present.     Mental Status: She is alert and oriented to person, place, and time.  Psychiatric:        Mood and Affect: Mood normal.           Assessment & Plan:  Marland KitchenMarland KitchenJazzma was seen today for suture / staple  removal.  Diagnoses and all orders for this visit:  Sebaceous cyst  HYPERTENSION, BENIGN  3 stitches removed without problems.   Wound healing well. Some soft tissue redness and swelling. Ice and use vitamin E cream for scarring. Follow up as needed.   BP still elevated. Keep log of BP at home. Pt did not want medications adjusted today. Discussed Mediterranean diet/low salt and walking daily to see if could lower BP. Follow up with PCP in 3 weeks for blood pressure recheck and to go over log.

## 2019-04-29 NOTE — Patient Instructions (Signed)
Epidermal Cyst Removal, Care After This sheet gives you information about how to care for yourself after your procedure. Your health care provider may also give you more specific instructions. If you have problems or questions, contact your health care provider. What can I expect after the procedure? After the procedure, it is common to have:  Soreness in the area where your cyst was removed.  Tightness or itchiness from the stitches (sutures) in your skin. Follow these instructions at home: Medicines  Take over-the-counter and prescription medicines only as told by your health care provider.  If you were prescribed an antibiotic medicine or ointment, take or apply it as told by your health care provider. Do not stop using the antibiotic even if you start to feel better. Incision care   Follow instructions from your health care provider about how to take care of your incision. Make sure you: ? Wash your hands with soap and water before you change your bandage (dressing). If soap and water are not available, use hand sanitizer. ? Change your dressing as told by your health care provider. ? Leave sutures, skin glue, or adhesive strips in place. These skin closures may need to stay in place for 1-2 weeks or longer. If adhesive strip edges start to loosen and curl up, you may trim the loose edges. Do not remove adhesive strips completely unless your health care provider tells you to do that.  Keep the dressingdry until your health care provider says that it can be removed.  After your dressing is off, check your incision area every day for signs of infection. Check for: ? Redness, swelling, or pain. ? Fluid or blood. ? Warmth. ? Pus or a bad smell. General instructions  Do not take baths, swim, or use a hot tub until your health care provider approves. Ask your health care provider if you may take showers. You may only be allowed to take sponge baths.  Your health care provider may ask  you to avoid contact sports or activities that take a lot of effort. Do not do anything that stretches or puts pressure on your incision.  You can return to your normal diet.  Keep all follow-up visits as told by your health care provider. This is important. Contact a health care provider if:  You have a fever.  You have redness, swelling, or pain in the incision area.  You have fluid or blood coming from your incision.  You have pus or a bad smell coming from your incision.  Your incision feels warm to the touch.  Your cyst grows back. Summary  After the procedure, it is common to have soreness in the area where your cyst was removed.  Take or apply over-the-counter and prescription medicines only as told by your health care provider.  Follow instructions from your health care provider about how to take care of your incision. This information is not intended to replace advice given to you by your health care provider. Make sure you discuss any questions you have with your health care provider. Document Revised: 06/04/2017 Document Reviewed: 12/06/2016 Elsevier Patient Education  2020 Elsevier Inc.  

## 2019-05-05 ENCOUNTER — Telehealth: Payer: Self-pay

## 2019-05-05 NOTE — Telephone Encounter (Signed)
Billing from [facility]. Need additional codes for vitamin B12, Ferritin and iron.   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

## 2019-05-06 NOTE — Telephone Encounter (Signed)
Added

## 2019-05-06 NOTE — Telephone Encounter (Signed)
R68.89Other general symptoms and signs

## 2019-05-11 ENCOUNTER — Other Ambulatory Visit: Payer: Self-pay | Admitting: Family Medicine

## 2019-05-18 ENCOUNTER — Telehealth: Payer: Self-pay

## 2019-05-18 DIAGNOSIS — Z0100 Encounter for examination of eyes and vision without abnormal findings: Secondary | ICD-10-CM

## 2019-05-18 NOTE — Telephone Encounter (Signed)
Order signed. Pleas fax.

## 2019-05-18 NOTE — Telephone Encounter (Signed)
[name] called the eye care center to make an appointment. They advised she needs a referral. She needs a vision check. Pended referral.   [facility] Address: [address] [place], [place] [address] Phone: [phone] Fax: [phone]

## 2019-05-20 ENCOUNTER — Encounter: Payer: Self-pay | Admitting: Family Medicine

## 2019-05-20 ENCOUNTER — Ambulatory Visit (INDEPENDENT_AMBULATORY_CARE_PROVIDER_SITE_OTHER): Payer: Medicare Other | Admitting: Family Medicine

## 2019-05-20 ENCOUNTER — Other Ambulatory Visit: Payer: Self-pay

## 2019-05-20 VITALS — BP 145/59 | HR 79 | Ht 70.0 in | Wt 218.0 lb

## 2019-05-20 DIAGNOSIS — N1832 Chronic kidney disease, stage 3b: Secondary | ICD-10-CM | POA: Diagnosis not present

## 2019-05-20 DIAGNOSIS — R7301 Impaired fasting glucose: Secondary | ICD-10-CM

## 2019-05-20 DIAGNOSIS — M48062 Spinal stenosis, lumbar region with neurogenic claudication: Secondary | ICD-10-CM

## 2019-05-20 DIAGNOSIS — R351 Nocturia: Secondary | ICD-10-CM | POA: Diagnosis not present

## 2019-05-20 DIAGNOSIS — I10 Essential (primary) hypertension: Secondary | ICD-10-CM

## 2019-05-20 LAB — POCT URINALYSIS DIP (CLINITEK)
Bilirubin, UA: NEGATIVE
Blood, UA: NEGATIVE
Glucose, UA: NEGATIVE mg/dL
Ketones, POC UA: NEGATIVE mg/dL
Leukocytes, UA: NEGATIVE
Nitrite, UA: NEGATIVE
POC PROTEIN,UA: NEGATIVE
Spec Grav, UA: >=1.03 — AB (ref 1.010–1.025)
Urobilinogen, UA: 0.2 E.U./dL
pH, UA: 6 (ref 5.0–8.0)

## 2019-05-20 LAB — POCT GLYCOSYLATED HEMOGLOBIN (HGB A1C): Hemoglobin A1C: 6 % — AB (ref 4.0–5.6)

## 2019-05-20 MED ORDER — OXYBUTYNIN CHLORIDE ER 5 MG PO TB24: 5.0000 mg | ORAL_TABLET | Freq: Every day | ORAL | 0 refills | Status: DC

## 2019-05-20 NOTE — Assessment & Plan Note (Signed)
Handicap placard form filled out for the [facility] per patient request.  Will scan copy to be filed.

## 2019-05-20 NOTE — Assessment & Plan Note (Signed)
Recheck renal funciton Q 6 mo

## 2019-05-20 NOTE — Assessment & Plan Note (Signed)
A1c looks great today at 6.0.  Continue to work on healthy diet and staying active she is not able to exercise regularly because of her spinal stenosis.  Follow-up in 6 months.  Due for BMP today.

## 2019-05-20 NOTE — Assessment & Plan Note (Signed)
Trial of oxybutynin. Warned about potential S.E. F/U in 4-6 weeks.

## 2019-05-20 NOTE — Progress Notes (Signed)
Established Patient Office Visit  Subjective:  Patient ID: [name], female    DOB: [birth year]  Age: 74 y.o. MRN: [id number]  CC:  Chief Complaint  Patient presents with  . Hypertension    HPI [name] presents for   Impaired fasting glucose-no increased thirst or urination. No symptoms consistent with hypoglycemia.  She is also requesting handicap placard for her spinal stenosis.  She said she is just getting to the point where with any extended walking she is getting a lot of weakness in her legs.  They also feel very heavy at times.  Hypertension- Pt denies chest pain, SOB, dizziness, or heart palpitations.  Taking meds as directed w/o problems.  Denies medication side effects.    She also complains that her nocturia is getting worse.  She said she actually timed it on Monday night and she woke up every 2 hours to urinate.  She says she really does not have any major problems during the daytime with frequency or urgency or incontinence.  She is even tried eliminating fluids before bedtime and it does not seem to make a difference.   Past Medical History:  Diagnosis Date  . Diverticulosis   . Hiatal hernia   . Hyperlipidemia    borderline  . Hypertension   . Spinal stenosis 2019    Past Surgical History:  Procedure Laterality Date  . ABDOMINAL HYSTERECTOMY     TAH Pt. unsure if BSO  . BREAST REDUCTION SURGERY  1994  . BREAST SURGERY     Reduction  . LIPOSUCTION    . REDUCTION MAMMAPLASTY Bilateral 1994    Family History  Problem Relation Age of Onset  . Breast cancer Sister 24       COD breast cancer and pneumonia  . Hypertension Sister   . Heart attack Father   . Hypertension Father   . Hypertension Mother   . Kidney disease Mother   . Breast cancer Other        ages 5 and 75--2 nieces  . Hypertension Brother   . Colon cancer Neg Hx   . Esophageal cancer Neg Hx   . Rectal cancer Neg Hx   . Stomach cancer Neg Hx     Social History    Socioeconomic History  . Marital status: Married    Spouse name: Broadus John  . Number of children: 0  . Years of education: 14  . Highest education level: 12th grade  Occupational History  . 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 Smoking status: Never Smoker  . Smokeless tobacco: Never Used  Substance and Sexual Activity  . Alcohol use: No    Alcohol/week: 0.0 standard drinks  . Drug use: No  . Sexual activity: Not Currently    Partners: Male    Comment: HYST-1st intercourse 47 yo-5 partners  Other Topics Concern  . Not on file  Social History Narrative   Exercising some 2 day a week,   walks on treadmill. Discussed with patient to increase frequency of exercise a week.   Social Determinants of Health   Financial Resource Strain:   . Difficulty of Paying Living Expenses:   Food Insecurity:   . Worried About Charity fundraiser in the Last Year:   . Arboriculturist in the Last Year:   Transportation Needs:   . Film/video editor (Medical):   Marland Kitchen Lack of Transportation (Non-Medical):   Physical  Activity: Insufficiently Active  . Days of Exercise per Week: 2 days  . Minutes of Exercise per Session: 30 min  Stress:   . Feeling of Stress :   Social Connections:   . Frequency of Communication with Friends and Family:   . Frequency of Social Gatherings with Friends and Family:   . Attends Religious Services:   . Active Member of Clubs or Organizations:   . Attends Banker Meetings:   Marland Kitchen Marital Status:   Intimate Partner Violence:   . Fear of Current or Ex-Partner:   . Emotionally Abused:   Marland Kitchen Physically Abused:   . Sexually Abused:     Outpatient Medications Prior to Visit  Medication Sig Dispense Refill  . aspirin 81 MG tablet Take 81 mg by mouth daily.      . Calcium Carbonate-Vitamin D (CALCIUM 600+D) 600-200 MG-UNIT TABS Take by mouth.      Marland Kitchen lisinopril-hydrochlorothiazide (ZESTORETIC) 20-12.5 MG tablet TAKE 1  TABLET BY MOUTH EVERY DAY 90 tablet 1  . MAGNESIUM PO Take 2 tablets by mouth daily.    . Multiple Vitamins-Minerals (MULTIVITAMIN PO) Take by mouth.    . Omega-3 Fatty Acids (FISH OIL) 1000 MG CPDR Take by mouth.      Marland Kitchen OVER THE COUNTER MEDICATION Curamin Extra Strength PO    . OVER THE COUNTER MEDICATION Beet supplement    . Psyllium (METAMUCIL PO) Take by mouth.    Marland Kitchen VITAMIN D PO Take 1 tablet by mouth daily.    . vitamin k 100 MCG tablet Take 100 mcg by mouth daily.    . AMBULATORY NON FORMULARY MEDICATION Medication Name: Tdap IM x a 1 Units 0  . AMBULATORY NON FORMULARY MEDICATION Medication Name: zostavax IM x 1 1 vial 0  . amLODipine (NORVASC) 5 MG tablet Take 1 tablet (5 mg total) by mouth daily. 90 tablet 1  . Probiotic Product (PROBIOTIC DAILY PO) Take by mouth.     No facility-administered medications prior to visit.    Allergies  Allergen Reactions  . Penicillins Rash    REACTION: rash    ROS Review of Systems    Objective:    Physical Exam  BP (!) 145/59   Pulse 79   Ht 5\' 10"  (1.778 m)   Wt 218 lb (98.9 kg)   SpO2 99%   BMI 31.28 kg/m  Wt Readings from Last 3 Encounters:  05/20/19 218 lb (98.9 kg)  04/29/19 215 lb (97.5 kg)  04/22/19 215 lb (97.5 kg)     There are no preventive care reminders to display for this patient.  There are no preventive care reminders to display for this patient.  Lab Results  Component Value Date   TSH 1.17 09/23/2018   Lab Results  Component Value Date   WBC 4.4 04/08/2015   HGB 11.6 (L) 04/08/2015   HCT 35.6 (L) 04/08/2015   MCV 83.2 04/08/2015   PLT 254 04/08/2015   Lab Results  Component Value Date   NA 139 12/24/2018   K 4.2 12/24/2018   CO2 29 12/24/2018   GLUCOSE 111 (H) 12/24/2018   BUN 16 12/24/2018   CREATININE 0.98 (H) 12/24/2018   BILITOT 0.5 12/24/2018   ALKPHOS 94 10/30/2016   AST 24 12/24/2018   ALT 18 12/24/2018   PROT 7.0 12/24/2018   ALBUMIN 4.4 10/30/2016   CALCIUM 9.5 12/24/2018    Lab Results  Component Value Date   CHOL 184 12/24/2018   Lab Results  Component Value Date   HDL 45 (L) 12/24/2018   Lab Results  Component Value Date   LDLCALC 114 (H) 12/24/2018   Lab Results  Component Value Date   TRIG 141 12/24/2018   Lab Results  Component Value Date   CHOLHDL 4.1 12/24/2018   Lab Results  Component Value Date   HGBA1C 6.0 (A) 05/20/2019      Assessment & Plan:   Problem List Items Addressed This Visit      Cardiovascular and Mediastinum   HYPERTENSION, BENIGN    BP elevated today.  Can recheck in 2 weeks with nurse visit.        Relevant Orders   BASIC METABOLIC PANEL WITH GFR     Endocrine   IFG (impaired fasting glucose)    A1c looks great today at 6.0.  Continue to work on healthy diet and staying active she is not able to exercise regularly because of her spinal stenosis.  Follow-up in 6 months.  Due for BMP today.      Relevant Orders   POCT glycosylated hemoglobin (Hb A1C) (Completed)   POCT URINALYSIS DIP (CLINITEK) (Completed)   BASIC METABOLIC PANEL WITH GFR     Genitourinary   Chronic kidney disease (CKD) stage G3b/A1, moderately decreased glomerular filtration rate (GFR) between 30-44 mL/min/1.73 square meter and albuminuria creatinine ratio less than 30 mg/g    Recheck renal funciton Q 6 mo      Relevant Orders   BASIC METABOLIC PANEL WITH GFR     Other   Spinal stenosis    Handicap placard form filled out for the [facility] per patient request.  Will scan copy to be filed.      Nocturia    Trial of oxybutynin. Warned about potential S.E. F/U in 4-6 weeks.       Relevant Medications   oxybutynin (DITROPAN XL) 5 MG 24 hr tablet    Other Visit Diagnoses    Nocturia more than twice per night    -  Primary   Relevant Orders   POCT glycosylated hemoglobin (Hb A1C) (Completed)   POCT URINALYSIS DIP (CLINITEK) (Completed)      Meds ordered this encounter  Medications  . oxybutynin (DITROPAN XL) 5 MG 24 hr tablet     Sig: Take 1 tablet (5 mg total) by mouth at bedtime.    Dispense:  30 tablet    Refill:  0    Follow-up: Return in about 4 weeks (around 06/17/2019) for OAB.    Nani Gasser, MD

## 2019-05-20 NOTE — Assessment & Plan Note (Signed)
BP elevated today.  Can recheck in 2 weeks with nurse visit.

## 2019-05-21 LAB — BASIC METABOLIC PANEL WITHOUT GFR
BUN/Creatinine Ratio: 22 (calc) (ref 6–22)
BUN: 21 mg/dL (ref 7–25)
CO2: 31 mmol/L (ref 20–32)
Calcium: 9.9 mg/dL (ref 8.6–10.4)
Chloride: 98 mmol/L (ref 98–110)
Creat: 0.94 mg/dL — ABNORMAL HIGH (ref 0.60–0.93)
GFR, Est African American: 70 mL/min/1.73m2
GFR, Est Non African American: 60 mL/min/1.73m2
Glucose, Bld: 81 mg/dL (ref 65–139)
Potassium: 4 mmol/L (ref 3.5–5.3)
Sodium: 138 mmol/L (ref 135–146)

## 2019-06-04 ENCOUNTER — Other Ambulatory Visit: Payer: Self-pay | Admitting: Family Medicine

## 2019-06-04 DIAGNOSIS — I10 Essential (primary) hypertension: Secondary | ICD-10-CM

## 2019-06-04 NOTE — Telephone Encounter (Signed)
Forwarding to covering provider. [facility] requesting med refill for amlodipine. Rx not listed in active med list.

## 2019-06-11 ENCOUNTER — Other Ambulatory Visit: Payer: Self-pay | Admitting: Family Medicine

## 2019-06-11 DIAGNOSIS — R351 Nocturia: Secondary | ICD-10-CM

## 2019-06-12 ENCOUNTER — Other Ambulatory Visit: Payer: Self-pay | Admitting: Family Medicine

## 2019-06-12 DIAGNOSIS — R351 Nocturia: Secondary | ICD-10-CM

## 2019-06-17 ENCOUNTER — Ambulatory Visit: Payer: Medicare Other | Admitting: Family Medicine

## 2019-06-22 ENCOUNTER — Encounter: Payer: Self-pay | Admitting: Family Medicine

## 2019-06-22 ENCOUNTER — Ambulatory Visit (INDEPENDENT_AMBULATORY_CARE_PROVIDER_SITE_OTHER): Payer: Medicare Other | Admitting: Family Medicine

## 2019-06-22 VITALS — BP 133/66 | HR 78 | Ht 70.0 in | Wt 219.0 lb

## 2019-06-22 DIAGNOSIS — I10 Essential (primary) hypertension: Secondary | ICD-10-CM | POA: Diagnosis not present

## 2019-06-22 DIAGNOSIS — M48062 Spinal stenosis, lumbar region with neurogenic claudication: Secondary | ICD-10-CM

## 2019-06-22 DIAGNOSIS — R351 Nocturia: Secondary | ICD-10-CM

## 2019-06-22 NOTE — Progress Notes (Signed)
Established Patient Office Visit  Subjective:  Patient ID: [name], female    DOB: 1945-11-05  Age: 74 y.o. MRN: [id number]  CC:  Chief Complaint  Patient presents with  . Over Active Bladder    medication didn't work. she reports that she did not notice a difference  . Spinal Stenosis    HPI [name] presents for   F/U OAB -last time I saw her about 4 weeks ago we had discussed that she was having quite frequent nocturia most every 2 hours at night.  She felt like she really was not having any issues at all during the daytime.  She said she even tried aluminating fluids it in the evening.  Decided to try oxybutynin.  She felt like it was not helpful.  F/U spinal Stenosis -last MRI was March 2018, approximately 3 years ago at that time it showed severe central canal and bilateral subarticular recess narrowing due to facet arthropathy and bulky ligamentum flavum thickening.  She also had moderately severe central canal stenosis with encroachment onto the L5 nerve root due to a small downturning disc protrusion protrusion.  She reports difficulty going up and down stairs, difficulty with walking in fact we actually filled out a handicap placard for her when she was here last time.  She also reports difficulty even just getting up and off the toilet.  She is becoming much more slow with her mobility and feels like her lower extremities are weak.  She really reports she does not have a lot of pain in her back which is wonderful.   Past Medical History:  Diagnosis Date  . Diverticulosis   . Hiatal hernia   . Hyperlipidemia    borderline  . Hypertension   . Spinal stenosis 2019    Past Surgical History:  Procedure Laterality Date  . ABDOMINAL HYSTERECTOMY     TAH Pt. unsure if BSO  . BREAST REDUCTION SURGERY  1994  . BREAST SURGERY     Reduction  . LIPOSUCTION    . REDUCTION MAMMAPLASTY Bilateral 1994    Family History  Problem Relation Age of Onset  .  Breast cancer Sister 50       COD breast cancer and pneumonia  . Hypertension Sister   . Heart attack Father   . Hypertension Father   . Hypertension Mother   . Kidney disease Mother   . Breast cancer Other        ages 31 and 51--2 nieces  . Hypertension Brother   . Colon cancer Neg Hx   . Esophageal cancer Neg Hx   . Rectal cancer Neg Hx   . Stomach cancer Neg Hx     Social History   Socioeconomic History  . Marital status: Married    Spouse name: Jomarie Longs  . Number of children: 0  . Years of education: 55  . Highest education level: 12th grade  Occupational History  . 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 Smoking status: Never Smoker  . Smokeless tobacco: Never Used  Substance and Sexual Activity  . Alcohol use: No    Alcohol/week: 0.0 standard drinks  . Drug use: No  . Sexual activity: Not Currently    Partners: Male    Comment: HYST-1st intercourse 23 yo-5 partners  Other Topics Concern  . Not on file  Social History Narrative   Exercising some 2 day a week,   walks on treadmill.  Discussed with patient to increase frequency of exercise a week.   Social Determinants of Health   Financial Resource Strain:   . Difficulty of Paying Living Expenses:   Food Insecurity:   . Worried About Charity fundraiser in the Last Year:   . Arboriculturist in the Last Year:   Transportation Needs:   . Film/video editor (Medical):   Marland Kitchen Lack of Transportation (Non-Medical):   Physical Activity: Insufficiently Active  . Days of Exercise per Week: 2 days  . Minutes of Exercise per Session: 30 min  Stress:   . Feeling of Stress :   Social Connections:   . Frequency of Communication with Friends and Family:   . Frequency of Social Gatherings with Friends and Family:   . Attends Religious Services:   . Active Member of Clubs or Organizations:   . Attends Archivist Meetings:   Marland Kitchen Marital Status:   Intimate Partner Violence:    . Fear of Current or Ex-Partner:   . Emotionally Abused:   Marland Kitchen Physically Abused:   . Sexually Abused:     Outpatient Medications Prior to Visit  Medication Sig Dispense Refill  . amLODipine (NORVASC) 5 MG tablet TAKE 1 TABLET BY MOUTH EVERY DAY 90 tablet 1  . aspirin 81 MG tablet Take 81 mg by mouth daily.      . Calcium Carbonate-Vitamin D (CALCIUM 600+D) 600-200 MG-UNIT TABS Take by mouth.      Marland Kitchen lisinopril-hydrochlorothiazide (ZESTORETIC) 20-12.5 MG tablet TAKE 1 TABLET BY MOUTH EVERY DAY 90 tablet 1  . MAGNESIUM PO Take 2 tablets by mouth daily.    . Multiple Vitamins-Minerals (MULTIVITAMIN PO) Take by mouth.    . Omega-3 Fatty Acids (FISH OIL) 1000 MG CPDR Take by mouth.      Marland Kitchen OVER THE COUNTER MEDICATION Curamin Extra Strength PO    . OVER THE COUNTER MEDICATION Beet supplement    . Psyllium (METAMUCIL PO) Take by mouth.    Marland Kitchen VITAMIN D PO Take 1 tablet by mouth daily.    . vitamin k 100 MCG tablet Take 100 mcg by mouth daily.    Marland Kitchen oxybutynin (DITROPAN-XL) 5 MG 24 hr tablet TAKE 1 TABLET BY MOUTH EVERYDAY AT BEDTIME 90 tablet 1   No facility-administered medications prior to visit.    Allergies  Allergen Reactions  . Penicillins Rash    REACTION: rash    ROS Review of Systems    Objective:    Physical Exam  Constitutional: She is oriented to person, place, and time. She appears well-developed and well-nourished.  HENT:  Head: Normocephalic and atraumatic.  Cardiovascular: Normal rate, regular rhythm and normal heart sounds.  Pulmonary/Chest: Effort normal and breath sounds normal.  Neurological: She is alert and oriented to person, place, and time.  Skin: Skin is warm and dry.  Psychiatric: She has a normal mood and affect. Her behavior is normal.    BP 133/66   Pulse 78   Ht 5\' 10"  (1.778 m)   Wt 219 lb (99.3 kg)   SpO2 100%   BMI 31.42 kg/m  Wt Readings from Last 3 Encounters:  06/22/19 219 lb (99.3 kg)  05/20/19 218 lb (98.9 kg)  04/29/19 215 lb  (97.5 kg)     There are no preventive care reminders to display for this patient.  There are no preventive care reminders to display for this patient.  Lab Results  Component Value Date   TSH 1.17 09/23/2018  Lab Results  Component Value Date   WBC 4.4 04/08/2015   HGB 11.6 (L) 04/08/2015   HCT 35.6 (L) 04/08/2015   MCV 83.2 04/08/2015   PLT 254 04/08/2015   Lab Results  Component Value Date   NA 138 05/20/2019   K 4.0 05/20/2019   CO2 31 05/20/2019   GLUCOSE 81 05/20/2019   BUN 21 05/20/2019   CREATININE 0.94 (H) 05/20/2019   BILITOT 0.5 12/24/2018   ALKPHOS 94 10/30/2016   AST 24 12/24/2018   ALT 18 12/24/2018   PROT 7.0 12/24/2018   ALBUMIN 4.4 10/30/2016   CALCIUM 9.9 05/20/2019   Lab Results  Component Value Date   CHOL 184 12/24/2018   Lab Results  Component Value Date   HDL 45 (L) 12/24/2018   Lab Results  Component Value Date   LDLCALC 114 (H) 12/24/2018   Lab Results  Component Value Date   TRIG 141 12/24/2018   Lab Results  Component Value Date   CHOLHDL 4.1 12/24/2018   Lab Results  Component Value Date   HGBA1C 6.0 (A) 05/20/2019      Assessment & Plan:   Problem List Items Addressed This Visit      Cardiovascular and Mediastinum   HYPERTENSION, BENIGN    Pressure on repeat looks much better today.  We will continue current regimen and continue to monitor.        Other   Spinal stenosis    Had a long discussion today about her options.  She does feel like her gait and ability to walk is significantly changed over the last 3 years when she had her MRI and when she had a consultation with Dr. Franky Macho.  We did discuss that at some point if she is continuing to decline she may need to seriously consider surgery even though her pain levels are really very manageable.  If she is going to have surgery she is good to be much more successful if she does it in her early 67s and if she does it in her late 46s.  Encouraged her to really give  this some heavy thought.      Nocturia    Oxybutynin was not helpful.  We discussed alternative medications which could be tried and/or referral to urology for further evaluation since she did not respond well to the medication that she did not have any significant side effects.  Also consider could be related to some of her spinal issues that are going on especially since her symptoms are only at night.       Other Visit Diagnoses    Nocturia more than twice per night    -  Primary      No orders of the defined types were placed in this encounter.   Follow-up: Return in about 5 months (around 11/22/2019) for IFG/BP.    Nani Gasser, MD

## 2019-06-22 NOTE — Assessment & Plan Note (Signed)
Oxybutynin was not helpful.  We discussed alternative medications which could be tried and/or referral to urology for further evaluation since she did not respond well to the medication that she did not have any significant side effects.  Also consider could be related to some of her spinal issues that are going on especially since her symptoms are only at night.

## 2019-06-22 NOTE — Assessment & Plan Note (Signed)
Pressure on repeat looks much better today.  We will continue current regimen and continue to monitor.

## 2019-06-22 NOTE — Assessment & Plan Note (Signed)
Had a long discussion today about her options.  She does feel like her gait and ability to walk is significantly changed over the last 3 years when she had her MRI and when she had a consultation with Dr. Franky Macho.  We did discuss that at some point if she is continuing to decline she may need to seriously consider surgery even though her pain levels are really very manageable.  If she is going to have surgery she is good to be much more successful if she does it in her early 67s and if she does it in her late 27s.  Encouraged her to really give this some heavy thought.

## 2019-06-23 ENCOUNTER — Ambulatory Visit: Payer: Medicare Other | Admitting: Family Medicine

## 2019-10-01 ENCOUNTER — Other Ambulatory Visit: Payer: Self-pay | Admitting: Family Medicine

## 2019-10-01 DIAGNOSIS — Z1231 Encounter for screening mammogram for malignant neoplasm of breast: Secondary | ICD-10-CM

## 2019-10-28 ENCOUNTER — Encounter: Payer: Medicare Other | Admitting: Nurse Practitioner

## 2019-10-29 ENCOUNTER — Encounter: Payer: Medicare Other | Admitting: Nurse Practitioner

## 2019-10-29 ENCOUNTER — Ambulatory Visit: Payer: Medicare Other

## 2019-11-04 ENCOUNTER — Ambulatory Visit (INDEPENDENT_AMBULATORY_CARE_PROVIDER_SITE_OTHER): Payer: Medicare Other | Admitting: Physician Assistant

## 2019-11-04 ENCOUNTER — Encounter: Payer: Self-pay | Admitting: Physician Assistant

## 2019-11-04 VITALS — BP 152/78 | HR 83 | Ht 70.0 in | Wt 220.0 lb

## 2019-11-04 DIAGNOSIS — I10 Essential (primary) hypertension: Secondary | ICD-10-CM

## 2019-11-04 DIAGNOSIS — L910 Hypertrophic scar: Secondary | ICD-10-CM | POA: Diagnosis not present

## 2019-11-04 MED ORDER — FLURANDRENOLIDE 4 MCG/SQCM EX TAPE: 1.0000 | MEDICATED_TAPE | Freq: Two times a day (BID) | CUTANEOUS | 1 refills | Status: DC

## 2019-11-04 NOTE — Progress Notes (Signed)
   Subjective:    Patient ID: [name], female    DOB: 1945-07-15, 74 y.o.   MRN: [id number]  HPI  Pt is a 74 yo female who presents to the clinic to discuss scar where sebaceous cyst was removed in febuary. No pain just bothers her with appearance. No other scars like this.   Pt is taking BP medications. No CP, palpitations, headaches, vision changes, dizziness. Taking norvasc every morning. Not checking BP at home.    .. Active Ambulatory Problems    Diagnosis Date Noted  . Hyperlipidemia 01/06/2008  . HYPERTENSION, BENIGN 01/06/2008  . Pelvic kidney 07/30/2012  . Lumbar degenerative disc disease 04/07/2013  . Left shoulder pain 07/27/2013  . Primary osteoarthritis of both knees 07/27/2013  . Chronic kidney disease (CKD) stage G3b/A1, moderately decreased glomerular filtration rate (GFR) between 30-44 mL/min/1.73 square meter and albuminuria creatinine ratio less than 30 mg/g 06/11/2014  . IFG (impaired fasting glucose) 06/11/2014  . Spinal stenosis 05/05/2016  . Primary osteoarthritis of right hip 07/29/2018  . Nocturia 05/20/2019  . Keloid scar 11/04/2019   Resolved Ambulatory Problems    Diagnosis Date Noted  . KNEE PAIN, RIGHT 09/13/2009  . Encounter for Medicare annual wellness exam 10/20/2015  . Needs flu shot 10/20/2015  . Infected sebaceous cyst 12/07/2015   Past Medical History:  Diagnosis Date  . Diverticulosis   . Hiatal hernia   . Hypertension       Review of Systems  All other systems reviewed and are negative.      Objective:   Physical Exam Vitals reviewed.  Constitutional:      Appearance: Normal appearance. She is obese.  Cardiovascular:     Rate and Rhythm: Normal rate and regular rhythm.     Pulses: Normal pulses.     Heart sounds: Murmur heard.   Pulmonary:     Effort: Pulmonary effort is normal.     Breath sounds: Normal breath sounds.  Musculoskeletal:     Right lower leg: No edema.     Left lower leg: No edema.  Skin:     Comments: 1.5cm linear keliod scar left upper chest over incision. Well healed and not tender.   Neurological:     General: No focal deficit present.     Mental Status: She is alert and oriented to person, place, and time.  Psychiatric:        Mood and Affect: Mood normal.           Assessment & Plan:  Marland KitchenMarland KitchenJenah was seen today for cyst.  Diagnoses and all orders for this visit:  Keloid scar -     Flurandrenolide 4 MCG/SQCM TAPE; Apply 1 each topically every 12 (twelve) hours. Over keloid scar. -     Ambulatory referral to Dermatology  HYPERTENSION, BENIGN   Discussed keloid and formation. Trial of topical steroid given. Referral made to dermatology for more potential treatment of scar.   BP elevated today not controlled. Has appt with PCP in 1 month. Start checking BP at home and bring in log. Discussed low salt intake. Last labs in march kidney function looked good. Discussed DASH diet. Stay on norvasc for now she did not want to make any changes. Discussed importance of BP control.

## 2019-11-15 ENCOUNTER — Other Ambulatory Visit: Payer: Self-pay | Admitting: Family Medicine

## 2019-11-23 ENCOUNTER — Ambulatory Visit (INDEPENDENT_AMBULATORY_CARE_PROVIDER_SITE_OTHER): Payer: Medicare Other | Admitting: Family Medicine

## 2019-11-23 ENCOUNTER — Other Ambulatory Visit: Payer: Self-pay

## 2019-11-23 ENCOUNTER — Encounter: Payer: Self-pay | Admitting: Family Medicine

## 2019-11-23 VITALS — BP 136/76 | HR 84 | Ht 70.0 in | Wt 218.0 lb

## 2019-11-23 DIAGNOSIS — M48062 Spinal stenosis, lumbar region with neurogenic claudication: Secondary | ICD-10-CM

## 2019-11-23 DIAGNOSIS — Z23 Encounter for immunization: Secondary | ICD-10-CM | POA: Diagnosis not present

## 2019-11-23 DIAGNOSIS — M4807 Spinal stenosis, lumbosacral region: Secondary | ICD-10-CM

## 2019-11-23 DIAGNOSIS — I10 Essential (primary) hypertension: Secondary | ICD-10-CM | POA: Diagnosis not present

## 2019-11-23 DIAGNOSIS — R7301 Impaired fasting glucose: Secondary | ICD-10-CM | POA: Diagnosis not present

## 2019-11-23 LAB — CBC
HCT: 38 % (ref 35.0–45.0)
Hemoglobin: 12.2 g/dL (ref 11.7–15.5)
MCH: 27.7 pg (ref 27.0–33.0)
MCHC: 32.1 g/dL (ref 32.0–36.0)
MCV: 86.2 fL (ref 80.0–100.0)
MPV: 9.9 fL (ref 7.5–12.5)
Platelets: 268 10*3/uL (ref 140–400)
RBC: 4.41 10*6/uL (ref 3.80–5.10)
RDW: 13.1 % (ref 11.0–15.0)
WBC: 4.2 10*3/uL (ref 3.8–10.8)

## 2019-11-23 LAB — COMPLETE METABOLIC PANEL WITH GFR
AG Ratio: 1.6 (calc) (ref 1.0–2.5)
ALT: 23 U/L (ref 6–29)
AST: 29 U/L (ref 10–35)
Albumin: 4.6 g/dL (ref 3.6–5.1)
Alkaline phosphatase (APISO): 79 U/L (ref 37–153)
BUN/Creatinine Ratio: 19 (calc) (ref 6–22)
BUN: 20 mg/dL (ref 7–25)
CO2: 31 mmol/L (ref 20–32)
Calcium: 9.9 mg/dL (ref 8.6–10.4)
Chloride: 97 mmol/L — ABNORMAL LOW (ref 98–110)
Creat: 1.06 mg/dL — ABNORMAL HIGH (ref 0.60–0.93)
GFR, Est African American: 60 mL/min/{1.73_m2} (ref >=60)
GFR, Est Non African American: 52 mL/min/{1.73_m2} — ABNORMAL LOW (ref >=60)
Globulin: 2.8 g/dL (calc) (ref 1.9–3.7)
Glucose, Bld: 112 mg/dL — ABNORMAL HIGH (ref 65–99)
Potassium: 4 mmol/L (ref 3.5–5.3)
Sodium: 137 mmol/L (ref 135–146)
Total Bilirubin: 0.6 mg/dL (ref 0.2–1.2)
Total Protein: 7.4 g/dL (ref 6.1–8.1)

## 2019-11-23 LAB — LIPID PANEL
Cholesterol: 207 mg/dL — ABNORMAL HIGH (ref <200)
HDL: 46 mg/dL — ABNORMAL LOW (ref >=50)
LDL Cholesterol (Calc): 130 mg/dL (calc) — ABNORMAL HIGH
Non-HDL Cholesterol (Calc): 161 mg/dL (calc) — ABNORMAL HIGH (ref <130)
Total CHOL/HDL Ratio: 4.5 (calc) (ref <5.0)
Triglycerides: 171 mg/dL — ABNORMAL HIGH (ref <150)

## 2019-11-23 LAB — POCT GLYCOSYLATED HEMOGLOBIN (HGB A1C): Hemoglobin A1C: 5.6 % (ref 4.0–5.6)

## 2019-11-23 MED ORDER — AMLODIPINE BESYLATE 5 MG PO TABS: 5.0000 mg | ORAL_TABLET | Freq: Every day | ORAL | 1 refills | Status: DC

## 2019-11-23 NOTE — Assessment & Plan Note (Signed)
A1c looks phenomenal today.  In fact it is down to 5.6 back into the normal range which is awesome.  Continue to work on [employer] and regular exercise congratulated her on the changes that she is made.  Plan to follow-up in 6 months.

## 2019-11-23 NOTE — Progress Notes (Signed)
Established Patient Office Visit  Subjective:  Patient ID: [name], female    DOB: 1945-09-18  Age: 74 y.o. MRN: [id number]  CC:  Chief Complaint  Patient presents with  . ifg  . Hypertension    HPI [name] presents  for   Hypertension- Pt denies chest pain, SOB, dizziness, or heart palpitations.  Taking meds as directed w/o problems.  Denies medication side effects.    Impaired fasting glucose-no increased thirst or urination. No symptoms consistent with hypoglycemia.  Also brought in some paperwork from her housecall practitioner through her health insurance.   Past Medical History:  Diagnosis Date  . Diverticulosis   . Hiatal hernia   . Hyperlipidemia    borderline  . Hypertension   . Spinal stenosis 2019    Past Surgical History:  Procedure Laterality Date  . ABDOMINAL HYSTERECTOMY     TAH Pt. unsure if BSO  . BREAST REDUCTION SURGERY  1994  . BREAST SURGERY     Reduction  . LIPOSUCTION    . REDUCTION MAMMAPLASTY Bilateral 1994    Family History  Problem Relation Age of Onset  . Breast cancer Sister 10       COD breast cancer and pneumonia  . Hypertension Sister   . Heart attack Father   . Hypertension Father   . Hypertension Mother   . Kidney disease Mother   . Breast cancer Other        ages 9 and 51--2 nieces  . Hypertension Brother   . Colon cancer Neg Hx   . Esophageal cancer Neg Hx   . Rectal cancer Neg Hx   . Stomach cancer Neg Hx     Social History   Socioeconomic History  . Marital status: Married    Spouse name: Jomarie Longs  . Number of children: 0  . Years of education: 45  . Highest education level: 12th grade  Occupational History  . 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 Smoking status: Never Smoker  . Smokeless tobacco: Never Used  Vaping Use  . Vaping Use: Never used  Substance and Sexual Activity  . Alcohol use: No    Alcohol/week: 0.0 standard drinks  . Drug  use: No  . Sexual activity: Not Currently    Partners: Male    Comment: HYST-1st intercourse 7 yo-5 partners  Other Topics Concern  . Not on file  Social History Narrative   Exercising some 2 day a week,   walks on treadmill. Discussed with patient to increase frequency of exercise a week.   Social Determinants of Health   Financial Resource Strain:   . Difficulty of Paying Living Expenses: Not on file  Food Insecurity:   . Worried About Programme researcher, broadcasting/film/video in the Last Year: Not on file  . Ran Out of Food in the Last Year: Not on file  Transportation Needs:   . Lack of Transportation (Medical): Not on file  . Lack of Transportation (Non-Medical): Not on file  Physical Activity: Insufficiently Active  . Days of Exercise per Week: 2 days  . Minutes of Exercise per Session: 30 min  Stress:   . Feeling of Stress : Not on file  Social Connections:   . Frequency of Communication with Friends and Family: Not on file  . Frequency of Social Gatherings with Friends and Family: Not on file  . Attends Religious Services: Not on file  . Active Member  of Clubs or Organizations: Not on file  . Attends Banker Meetings: Not on file  . Marital Status: Not on file  Intimate Partner Violence:   . Fear of Current or Ex-Partner: Not on file  . Emotionally Abused: Not on file  . Physically Abused: Not on file  . Sexually Abused: Not on file    Outpatient Medications Prior to Visit  Medication Sig Dispense Refill  . aspirin 81 MG tablet Take 81 mg by mouth daily.      . Calcium Carbonate-Vitamin D (CALCIUM 600+D) 600-200 MG-UNIT TABS Take by mouth.      . Flurandrenolide 4 MCG/SQCM TAPE Apply 1 each topically every 12 (twelve) hours. Over keloid scar. 60 each 1  . lisinopril-hydrochlorothiazide (ZESTORETIC) 20-12.5 MG tablet TAKE 1 TABLET BY MOUTH EVERY DAY 90 tablet 0  . MAGNESIUM PO Take 2 tablets by mouth daily.    . Multiple Vitamins-Minerals (MULTIVITAMIN PO) Take by mouth.     . Omega-3 Fatty Acids (FISH OIL) 1000 MG CPDR Take by mouth.      Marland Kitchen OVER THE COUNTER MEDICATION Curamin Extra Strength PO    . OVER THE COUNTER MEDICATION Beet supplement    . Psyllium (METAMUCIL PO) Take by mouth.    Marland Kitchen VITAMIN D PO Take 1 tablet by mouth daily.    . vitamin k 100 MCG tablet Take 100 mcg by mouth daily.    Marland Kitchen amLODipine (NORVASC) 5 MG tablet TAKE 1 TABLET BY MOUTH EVERY DAY 90 tablet 1   No facility-administered medications prior to visit.    Allergies  Allergen Reactions  . Penicillins Rash    REACTION: rash    ROS Review of Systems    Objective:    Physical Exam Constitutional:      Appearance: She is well-developed.  HENT:     Head: Normocephalic and atraumatic.  Cardiovascular:     Rate and Rhythm: Normal rate and regular rhythm.     Heart sounds: Normal heart sounds.  Pulmonary:     Effort: Pulmonary effort is normal.     Breath sounds: Normal breath sounds.  Skin:    General: Skin is warm and dry.  Neurological:     Mental Status: She is alert and oriented to person, place, and time.  Psychiatric:        Behavior: Behavior normal.     BP 136/76   Pulse 84   Ht 5\' 10"  (1.778 m)   Wt 218 lb (98.9 kg)   SpO2 97%   BMI 31.28 kg/m  Wt Readings from Last 3 Encounters:  11/23/19 218 lb (98.9 kg)  11/04/19 220 lb (99.8 kg)  06/22/19 219 lb (99.3 kg)     There are no preventive care reminders to display for this patient.  There are no preventive care reminders to display for this patient.  Lab Results  Component Value Date   TSH 1.17 09/23/2018   Lab Results  Component Value Date   WBC 4.4 04/08/2015   HGB 11.6 (L) 04/08/2015   HCT 35.6 (L) 04/08/2015   MCV 83.2 04/08/2015   PLT 254 04/08/2015   Lab Results  Component Value Date   NA 138 05/20/2019   K 4.0 05/20/2019   CO2 31 05/20/2019   GLUCOSE 81 05/20/2019   BUN 21 05/20/2019   CREATININE 0.94 (H) 05/20/2019   BILITOT 0.5 12/24/2018   ALKPHOS 94 10/30/2016   AST 24  12/24/2018   ALT 18 12/24/2018   PROT  7.0 12/24/2018   ALBUMIN 4.4 10/30/2016   CALCIUM 9.9 05/20/2019   Lab Results  Component Value Date   CHOL 184 12/24/2018   Lab Results  Component Value Date   HDL 45 (L) 12/24/2018   Lab Results  Component Value Date   LDLCALC 114 (H) 12/24/2018   Lab Results  Component Value Date   TRIG 141 12/24/2018   Lab Results  Component Value Date   CHOLHDL 4.1 12/24/2018   Lab Results  Component Value Date   HGBA1C 5.6 11/23/2019      Assessment & Plan:   Problem List Items Addressed This Visit      Cardiovascular and Mediastinum   HYPERTENSION, BENIGN    Blood pressure elevated but repeat blood pressure much improved.  We will continue to monitor on current regimen.      Relevant Medications   amLODipine (NORVASC) 5 MG tablet   Other Relevant Orders   CBC   COMPLETE METABOLIC PANEL WITH GFR   Lipid panel     Endocrine   IFG (impaired fasting glucose) - Primary    A1c looks phenomenal today.  In fact it is down to 5.6 back into the normal range which is awesome.  Continue to work on [employer] and regular exercise congratulated her on the changes that she is made.  Plan to follow-up in 6 months.      Relevant Orders   POCT glycosylated hemoglobin (Hb A1C) (Completed)   CBC   COMPLETE METABOLIC PANEL WITH GFR   Lipid panel     Other   Spinal stenosis    Fortunately, she feels like the weakness in her legs is getting worse she is having more difficulty walking and staying active.  We will go ahead and move forward with MRI of the lumbar spine.  S1 was about 3 years ago.      Relevant Orders   MR Lumbar Spine Wo Contrast    Other Visit Diagnoses    Need for immunization against influenza       Relevant Orders   Flu Vaccine QUAD High Dose(Fluad) (Completed)   Essential hypertension       Relevant Medications   amLODipine (NORVASC) 5 MG tablet      Meds ordered this encounter  Medications  . amLODipine  (NORVASC) 5 MG tablet    Sig: Take 1 tablet (5 mg total) by mouth daily.    Dispense:  90 tablet    Refill:  1    Follow-up: Return in about 6 months (around 05/22/2020) for Hypertension and A1C.    Nani Gasser, MD

## 2019-11-23 NOTE — Assessment & Plan Note (Signed)
Blood pressure elevated but repeat blood pressure much improved.  We will continue to monitor on current regimen.

## 2019-11-23 NOTE — Assessment & Plan Note (Signed)
Fortunately, she feels like the weakness in her legs is getting worse she is having more difficulty walking and staying active.  We will go ahead and move forward with MRI of the lumbar spine.  S1 was about 3 years ago.

## 2019-11-25 ENCOUNTER — Ambulatory Visit
Admission: RE | Admit: 2019-11-25 | Discharge: 2019-11-25 | Disposition: A | Discharge status: Home / Self Care | Payer: Medicare Other | Source: Ambulatory Visit | Attending: Family Medicine | Admitting: Family Medicine

## 2019-11-25 ENCOUNTER — Other Ambulatory Visit: Payer: Self-pay

## 2019-11-25 ENCOUNTER — Encounter: Payer: Self-pay | Admitting: Nurse Practitioner

## 2019-11-25 ENCOUNTER — Ambulatory Visit (INDEPENDENT_AMBULATORY_CARE_PROVIDER_SITE_OTHER): Payer: Medicare Other | Admitting: Nurse Practitioner

## 2019-11-25 VITALS — BP 124/80 | Ht 70.0 in | Wt 219.0 lb

## 2019-11-25 DIAGNOSIS — Z1231 Encounter for screening mammogram for malignant neoplasm of breast: Secondary | ICD-10-CM

## 2019-11-25 DIAGNOSIS — Z90710 Acquired absence of both cervix and uterus: Secondary | ICD-10-CM | POA: Diagnosis not present

## 2019-11-25 DIAGNOSIS — Z01419 Encounter for gynecological examination (general) (routine) without abnormal findings: Secondary | ICD-10-CM | POA: Diagnosis not present

## 2019-11-25 NOTE — Patient Instructions (Signed)
Health Maintenance After Age 74 After age 74, you are at a higher risk for certain long-term diseases and infections as well as injuries from falls. Falls are a major cause of broken bones and head injuries in people who are older than age 74. Getting regular preventive care can help to keep you healthy and well. Preventive care includes getting regular testing and making lifestyle changes as recommended by your health care provider. Talk with your health care provider about:  Which screenings and tests you should have. A screening is a test that checks for a disease when you have no symptoms.  A diet and exercise plan that is right for you. What should I know about screenings and tests to prevent falls? Screening and testing are the best ways to find a health problem early. Early diagnosis and treatment give you the best chance of managing medical conditions that are common after age 74. Certain conditions and lifestyle choices may make you more likely to have a fall. Your health care provider may recommend:  Regular vision checks. Poor vision and conditions such as cataracts can make you more likely to have a fall. If you wear glasses, make sure to get your prescription updated if your vision changes.  Medicine review. Work with your health care provider to regularly review all of the medicines you are taking, including over-the-counter medicines. Ask your health care provider about any side effects that may make you more likely to have a fall. Tell your health care provider if any medicines that you take make you feel dizzy or sleepy.  Osteoporosis screening. Osteoporosis is a condition that causes the bones to get weaker. This can make the bones weak and cause them to break more easily.  Blood pressure screening. Blood pressure changes and medicines to control blood pressure can make you feel dizzy.  Strength and balance checks. Your health care provider may recommend certain tests to check your  strength and balance while standing, walking, or changing positions.  Foot health exam. Foot pain and numbness, as well as not wearing proper footwear, can make you more likely to have a fall.  Depression screening. You may be more likely to have a fall if you have a fear of falling, feel emotionally low, or feel unable to do activities that you used to do.  Alcohol use screening. Using too much alcohol can affect your balance and may make you more likely to have a fall. What actions can I take to lower my risk of falls? General instructions  Talk with your health care provider about your risks for falling. Tell your health care provider if: ? You fall. Be sure to tell your health care provider about all falls, even ones that seem minor. ? You feel dizzy, sleepy, or off-balance.  Take over-the-counter and prescription medicines only as told by your health care provider. These include any supplements.  Eat a healthy diet and maintain a healthy weight. A healthy diet includes low-fat dairy products, low-fat (lean) meats, and fiber from whole grains, beans, and lots of fruits and vegetables. Home safety  Remove any tripping hazards, such as rugs, cords, and clutter.  Install safety equipment such as grab bars in bathrooms and safety rails on stairs.  Keep rooms and walkways well-lit. Activity   Follow a regular exercise program to stay fit. This will help you maintain your balance. Ask your health care provider what types of exercise are appropriate for you.  If you need a cane or   walker, use it as recommended by your health care provider.  Wear supportive shoes that have nonskid soles. Lifestyle  Do not drink alcohol if your health care provider tells you not to drink.  If you drink alcohol, limit how much you have: ? 0-1 drink a day for women. ? 0-2 drinks a day for men.  Be aware of how much alcohol is in your drink. In the [place] one drink equals one typical bottle of beer (12  oz), one-half glass of wine (5 oz), or one shot of hard liquor (1 oz).  Do not use any products that contain nicotine or tobacco, such as cigarettes and e-cigarettes. If you need help quitting, ask your health care provider. Summary  Having a healthy lifestyle and getting preventive care can help to protect your health and wellness after age 74.  Screening and testing are the best way to find a health problem early and help you avoid having a fall. Early diagnosis and treatment give you the best chance for managing medical conditions that are more common for people who are older than age 74.  Falls are a major cause of broken bones and head injuries in people who are older than age 74. Take precautions to prevent a fall at home.  Work with your health care provider to learn what changes you can make to improve your health and wellness and to prevent falls. This information is not intended to replace advice given to you by your health care provider. Make sure you discuss any questions you have with your health care provider. Document Revised: 06/05/2018 Document Reviewed: 12/26/2016 Elsevier Patient Education  2020 Elsevier Inc.  

## 2019-11-25 NOTE — Progress Notes (Signed)
   Elleanor Guyett Dolce 1945-02-28 [id number]   History:  74 y.o. G0 presents for breast and pelvic exam without GYN complaints. TAH for fibroids on no HRT. Normal pap and mammogram history.   Gynecologic History No LMP recorded. Patient has had a hysterectomy.   Last Pap: No longer screening per guidelines Last mammogram: 09/29/2018. Results were: normal. Scheduled for one today   Last colonoscopy: 12/06/2011. Results were: diverticulosis, 10 year follow up Last Dexa: 09/17/2017. Results were: normal, 5 year follow up recommended  Past medical history, past surgical history, family history and social history were all reviewed and documented in the EPIC chart.  ROS:  A ROS was performed and pertinent positives and negatives are included.  Exam:  Vitals:   11/25/19 1203  BP: 124/80  Weight: 219 lb (99.3 kg)  Height: 5\' 10"  (1.778 m)   Body mass index is 31.42 kg/m.  General appearance:  Normal Thyroid:  Symmetrical, normal in size, without palpable masses or nodularity. Respiratory  Auscultation:  Clear without wheezing or rhonchi Cardiovascular  Auscultation:  Regular rate, without rubs, murmurs or gallops  Edema/varicosities:  Not grossly evident Abdominal  Soft,nontender, without masses, guarding or rebound.  Liver/spleen:  No organomegaly noted  Hernia:  None appreciated  Skin  Inspection:  Grossly normal   Breasts: Examined lying and sitting.   Right: Without masses, retractions, discharge or axillary adenopathy.   Left: Without masses, retractions, discharge or axillary adenopathy. Gentitourinary   Inguinal/mons:  Normal without inguinal adenopathy  External genitalia:  Normal  BUS/Urethra/Skene's glands:  Normal  Vagina:  Normal  Cervix:  Absent  Uterus:  Absent  Adnexa/parametria:     Rt: Without masses or tenderness.   Lt: Without masses or tenderness.  Anus and perineum: Normal  Digital rectal exam: Normal sphincter tone without palpated masses or  tenderness  Assessment/Plan:  74 y.o. G0 for breast and pelvic exam.   Well female exam with routine gynecological exam - Education provided on SBEs, importance of preventative screenings, current guidelines, high calcium diet, regular exercise, and multivitamin daily. Labs with primary care.   History of total abdominal hysterectomy - for fibroids. No HRT.  Screening for breast cancer - normal mammogram history. Normal breast exam today. Sister and niece with breast cancer history. Most recent mammogram today.   Follow up in 2 years for annual.       [address] [facility], 12:15 PM 11/25/2019

## 2019-12-23 ENCOUNTER — Ambulatory Visit: Payer: Medicare Other

## 2020-02-12 ENCOUNTER — Other Ambulatory Visit: Payer: Self-pay | Admitting: Family Medicine

## 2020-02-18 ENCOUNTER — Encounter: Payer: Self-pay | Admitting: Nurse Practitioner

## 2020-02-18 ENCOUNTER — Telehealth (INDEPENDENT_AMBULATORY_CARE_PROVIDER_SITE_OTHER): Payer: Medicare Other | Admitting: Nurse Practitioner

## 2020-02-18 DIAGNOSIS — R21 Rash and other nonspecific skin eruption: Secondary | ICD-10-CM

## 2020-02-18 MED ORDER — PREDNISONE 20 MG PO TABS: 40.0000 mg | ORAL_TABLET | Freq: Every day | ORAL | 0 refills | Status: AC

## 2020-02-18 MED ORDER — HYDROXYZINE HCL 25 MG PO TABS: ORAL_TABLET | ORAL | 0 refills | Status: DC

## 2020-02-18 NOTE — Patient Instructions (Addendum)
If the itching and rash doesn't get better or if it gets any worse please let us know.   You may take 1/2 of the hydroxyzine pill during the day to help with itching, but this may still make you a little sleepy so be cautious.   Take cool showers. Moisturizers with oatmeal or menthol can be very soothing to the itch.   It is hard to tell if the tangerines had anything to do with the rash, but I would avoid eating any more citrus fruits until this goes away.   Pruritus Pruritus is an itchy feeling on the skin. One of the most common causes is dry skin, but many different things can cause itching. Most cases of itching do not require medical attention. Sometimes itchy skin can turn into a rash. Follow these instructions at home: Skin care   Apply moisturizing lotion to your skin as needed. Lotion that contains petroleum jelly is best.  Take medicines or apply medicated creams only as told by your health care provider. This may include: ? Corticosteroid cream. ? Anti-itch lotions. ? Oral antihistamines.  Apply a cool, wet cloth (cool compress) to the affected areas.  Take baths with one of the following: ? Epsom salts. You can get these at your local pharmacy or grocery store. Follow the instructions on the packaging. ? Baking soda. Pour a small amount into the bath as told by your health care provider. ? Colloidal oatmeal. You can get this at your local pharmacy or grocery store. Follow the instructions on the packaging.  Apply baking soda paste to your skin. To make the paste, stir water into a small amount of baking soda until it reaches a paste-like consistency.  Do not scratch your skin.  Do not take hot showers or baths, which can make itching worse. A cool shower may help with itching as long as you apply moisturizing lotion after the shower.  Do not use scented soaps, detergents, perfumes, and cosmetic products. Instead, use gentle, unscented versions of these items. General  instructions  Avoid wearing tight clothes.  Keep a journal to help find out what is causing your itching. Write down: ? What you eat and drink. ? What cosmetic products you use. ? What soaps or detergents you use. ? What you wear, including jewelry.  Use a humidifier. This keeps the air moist, which helps to prevent dry skin.  Be aware of any changes in your itchiness. Contact a health care provider if:  The itching does not go away after several days.  You are unusually thirsty or urinating more than normal.  Your skin tingles or feels numb.  Your skin or the white parts of your eyes turn yellow (jaundice).  You feel weak.  You have any of the following: ? Night sweats. ? Tiredness (fatigue). ? Weight loss. ? Abdominal pain. Summary  Pruritus is an itchy feeling on the skin. One of the most common causes is dry skin, but many different conditions and factors can cause itching.  Apply moisturizing lotion to your skin as needed. Lotion that contains petroleum jelly is best.  Take medicines or apply medicated creams only as told by your health care provider.  Do not take hot showers or baths. Do not use scented soaps, detergents, perfumes, or cosmetic products. This information is not intended to replace advice given to you by your health care provider. Make sure you discuss any questions you have with your health care provider. Document Revised: 02/26/2017 Document Reviewed:  02/26/2017 Elsevier Patient Education  2020 [employer].

## 2020-02-18 NOTE — Progress Notes (Signed)
Virtual Video Visit via MyChart Note- CONVERTED TO TELEPHONE DUE TO VIDEO CONNECTION FAILURE  I connected with  [name] on 02/18/20 at 11:20 AM EST by the video enabled telemedicine application for , MyChart, and verified that I am speaking with the correct person using two identifiers.   I introduced myself as a Publishing rights manager with the practice. We discussed the limitations of evaluation and management by telemedicine and the availability of in person appointments. The patient expressed understanding and agreed to proceed.  Participating parties in this visit include: the patient and the nurse practitioner only The patient is: at home I am: in the office  Subjective:    CC:  Chief Complaint  Patient presents with   Rash    With itching    HPI: [name] is a 74 y.o. y/o female presenting via MyChart today for rash with pruritis.  RASH Location: on back, both arms, both legs, and stomach  Duration:  about a week ago  Onset: are sudden in onset Itching: yes Burning: no Redness: no Oozing: no Scaling: no Blisters: no Painful: no Fevers: no Change in detergents/soaps/personal care products: no- no new sheets or clothing Recent illness: no Recent travel:no Shortness of breath: no  Throat/tongue swelling: no Myalgias/arthralgias: no Insect/Tick bite history: no History of same: no Context: the itching is worse, but the rash is stable Treatments attempted:benadryl, hydrocortisone cream Alleviating factors: nothing   The only difference that she remembers is she did eat several tangerines at the time of the onset of the rash. She has not had any in a couple of days.   Past medical history, Surgical history, Family history not pertinant except as noted below, Social history, Allergies, and medications have been entered into the medical record, reviewed, and corrections made.   Review of Systems:  All review of systems negative except what is listed in  the HPI  Objective:    General: Speaking clearly in complete sentences without any shortness of breath.   Alert and oriented x3.   Normal judgment.  No apparent acute distress.  Impression and Recommendations:    1. Rash and nonspecific skin eruption Pruritic rash, wide spread, with unknown etiology. No signs of respiratory or ENT involvement at this time.  It is possible the tangerines may be the cause of her symptoms given that the onset was at the time she began consuming them.  Recommend avoidance of tangerines and citrus fruits for the time being. May consider trying once again when the rash is completely gone to see if similar reaction occurs.  Recommend cool baths with oatmeal, epsom salts, and/or baking soda to soothe the skin. Cool showers if baths are not possible.  Recommend moisturizing the skin well with lotion that has menthol or oatmeal. Given the widespread symptoms, will start oral steroids for 5 days and hydroxyzine at night to help with itching and sleep. Discussed that she may try 1/2 tab hydroxyzine during the day for severe symptoms, but to make sure she is not driving and that she has time to rest.  Recommend follow-up if symptoms worsen or fail to improve.    I discussed the assessment and treatment plan with the patient. The patient was provided an opportunity to ask questions and all were answered. The patient agreed with the plan and demonstrated an understanding of the instructions.   The patient was advised to call back or seek an in-person evaluation if the symptoms worsen or if the condition fails to  improve as anticipated.  I provided 21 minutes of non-face-to-face interaction with this MYCHART visit including intake, same-day documentation, and chart review.   Tollie Eth, NP

## 2020-02-26 ENCOUNTER — Other Ambulatory Visit: Payer: Self-pay | Admitting: Nurse Practitioner

## 2020-02-26 DIAGNOSIS — R21 Rash and other nonspecific skin eruption: Secondary | ICD-10-CM

## 2020-03-09 ENCOUNTER — Telehealth: Payer: Self-pay | Admitting: Family Medicine

## 2020-03-09 NOTE — Telephone Encounter (Signed)
LM on voicemail @ 12:10pm for patient to call back so I could give recommendations to her.

## 2020-03-09 NOTE — Telephone Encounter (Signed)
If she is wanted codeine cough syrup then has to have a visit.  Otherwise she can use over-the-counter Robitussin and cough suppressant such as throat lozenges and running a humidifier.  Plus if she has a sudden onset of unexplained cough she probably needs to be evaluated.

## 2020-03-09 NOTE — Telephone Encounter (Signed)
Patient called into the office wanting to see if you would call her in some cough medicine.  Patient has had cough x 2 days and denies any other symptoms.  Please Advise!!

## 2020-03-09 NOTE — Telephone Encounter (Signed)
Pt was advised.

## 2020-05-23 ENCOUNTER — Ambulatory Visit: Payer: Medicare Other | Admitting: Family Medicine

## 2020-05-26 ENCOUNTER — Other Ambulatory Visit: Payer: Self-pay | Admitting: Family Medicine

## 2020-05-26 NOTE — Telephone Encounter (Signed)
Please call pt for f/u on bp, ifg and medication refills

## 2020-05-26 NOTE — Telephone Encounter (Signed)
LVM for patient to call back to get appt scheduled. AM 

## 2020-06-22 DIAGNOSIS — Z23 Encounter for immunization: Secondary | ICD-10-CM | POA: Diagnosis not present

## 2020-06-26 ENCOUNTER — Other Ambulatory Visit: Payer: Self-pay | Admitting: Family Medicine

## 2020-06-26 DIAGNOSIS — I10 Essential (primary) hypertension: Secondary | ICD-10-CM

## 2020-06-30 ENCOUNTER — Ambulatory Visit (INDEPENDENT_AMBULATORY_CARE_PROVIDER_SITE_OTHER): Payer: Medicare Other | Admitting: Family Medicine

## 2020-06-30 ENCOUNTER — Other Ambulatory Visit: Payer: Self-pay

## 2020-06-30 ENCOUNTER — Encounter: Payer: Self-pay | Admitting: Family Medicine

## 2020-06-30 VITALS — BP 136/68 | HR 76 | Ht 70.0 in | Wt 227.0 lb

## 2020-06-30 DIAGNOSIS — N1832 Chronic kidney disease, stage 3b: Secondary | ICD-10-CM

## 2020-06-30 DIAGNOSIS — R7301 Impaired fasting glucose: Secondary | ICD-10-CM | POA: Diagnosis not present

## 2020-06-30 DIAGNOSIS — I10 Essential (primary) hypertension: Secondary | ICD-10-CM

## 2020-06-30 DIAGNOSIS — R635 Abnormal weight gain: Secondary | ICD-10-CM | POA: Diagnosis not present

## 2020-06-30 LAB — POCT GLYCOSYLATED HEMOGLOBIN (HGB A1C): Hemoglobin A1C: 6 % — AB (ref 4.0–5.6)

## 2020-06-30 NOTE — Assessment & Plan Note (Signed)
Well controlled. Continue current regimen. Follow up in  6 mo  

## 2020-06-30 NOTE — Assessment & Plan Note (Signed)
Due to recheck renal function. 

## 2020-06-30 NOTE — Progress Notes (Signed)
Established Patient Office Visit  Subjective:  Patient ID: [name], female    DOB: [birth year]  Age: 75 y.o. MRN: [id number]  CC:  Chief Complaint  Patient presents with  . Hypertension  . ifg    HPI Payton Moder Zidek presents for   Hypertension- Pt denies chest pain, SOB, dizziness, or heart palpitations.  Taking meds as directed w/o problems.  Denies medication side effects.    Impaired fasting glucose-no increased thirst or urination. No symptoms consistent with hypoglycemia.  Weight has gone up about 8 pounds since she was last here 6 months ago.   Past Medical History:  Diagnosis Date  . Diverticulosis   . Hiatal hernia   . Hyperlipidemia    borderline  . Hypertension   . Spinal stenosis 2019    Past Surgical History:  Procedure Laterality Date  . ABDOMINAL HYSTERECTOMY     TAH Pt. unsure if BSO  . BREAST REDUCTION SURGERY  1994  . BREAST SURGERY     Reduction  . LIPOSUCTION    . REDUCTION MAMMAPLASTY Bilateral 1994    Family History  Problem Relation Age of Onset  . Breast cancer Sister 74       COD breast cancer and pneumonia  . Hypertension Sister   . Heart attack Father   . Hypertension Father   . Hypertension Mother   . Kidney disease Mother   . Breast cancer Other        ages 37 and 51--2 nieces  . Hypertension Brother   . Colon cancer Neg Hx   . Esophageal cancer Neg Hx   . Rectal cancer Neg Hx   . Stomach cancer Neg Hx     Social History   Socioeconomic History  . Marital status: Married    Spouse name: Jomarie Longs  . Number of children: 0  . Years of education: 59  . Highest education level: 12th grade  Occupational History  . 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 Smoking status: Never Smoker  . Smokeless tobacco: Never Used  Vaping Use  . Vaping Use: Never used  Substance and Sexual Activity  . Alcohol use: No    Alcohol/week: 0.0 standard drinks  . Drug use: No  . Sexual  activity: Not Currently    Partners: Male    Comment: HYST-1st intercourse 87 yo-5 partners  Other Topics Concern  . Not on file  Social History Narrative   Exercising some 2 day a week,   walks on treadmill. Discussed with patient to increase frequency of exercise a week.   Social Determinants of Health   Financial Resource Strain: Not on file  Food Insecurity: Not on file  Transportation Needs: Not on file  Physical Activity: Not on file  Stress: Not on file  Social Connections: Not on file  Intimate Partner Violence: Not on file    Outpatient Medications Prior to Visit  Medication Sig Dispense Refill  . amLODipine (NORVASC) 5 MG tablet TAKE 1 TABLET BY MOUTH EVERY DAY 90 tablet 1  . Calcium Carbonate-Vitamin D 600-200 MG-UNIT TABS Take by mouth.    . Ferrous Sulfate (IRON PO) Take 1 tablet by mouth daily.    Marland Kitchen Flurandrenolide 4 MCG/SQCM TAPE Apply 1 each topically every 12 (twelve) hours. Over keloid scar. 60 each 1  . lisinopril-hydrochlorothiazide (ZESTORETIC) 20-12.5 MG tablet TAKE 1 TABLET BY MOUTH EVERY DAY 90 tablet 0  . MAGNESIUM PO Take 2 tablets  by mouth daily.    . Multiple Vitamins-Minerals (MULTIVITAMIN PO) Take by mouth.    . Omega-3 Fatty Acids (FISH OIL) 1000 MG CPDR Take by mouth.    Marland Kitchen OVER THE COUNTER MEDICATION Curamin Extra Strength PO    . OVER THE COUNTER MEDICATION Beet supplement    . Psyllium (METAMUCIL PO) Take by mouth.    Marland Kitchen VITAMIN D PO Take 1 tablet by mouth daily.    Marland Kitchen aspirin 81 MG tablet Take 81 mg by mouth daily.    . hydrOXYzine (ATARAX/VISTARIL) 25 MG tablet TAKE 1 TAB 30 MINS BEFORE BEDTIME FOR ITCHING. MAY TAKE A SECOND DOSE IF NOT EFFECTIVE IN 30 MINUTES 180 tablet 1  . vitamin k 100 MCG tablet Take 100 mcg by mouth daily.     No facility-administered medications prior to visit.    Allergies  Allergen Reactions  . Penicillins Rash    REACTION: rash    ROS Review of Systems    Objective:    Physical Exam Constitutional:       Appearance: She is well-developed.  HENT:     Head: Normocephalic and atraumatic.     Right Ear: External ear normal.     Left Ear: External ear normal.     Mouth/Throat:     Mouth: Mucous membranes are moist.     Pharynx: Oropharynx is clear. No oropharyngeal exudate or posterior oropharyngeal erythema.  Eyes:     Pupils: Pupils are equal, round, and reactive to light.  Neck:     Thyroid: No thyromegaly.  Cardiovascular:     Rate and Rhythm: Normal rate and regular rhythm.     Heart sounds: Normal heart sounds.  Pulmonary:     Effort: Pulmonary effort is normal.     Breath sounds: Normal breath sounds. No wheezing.  Musculoskeletal:     Cervical back: Neck supple.  Lymphadenopathy:     Cervical: No cervical adenopathy.  Skin:    General: Skin is warm and dry.  Neurological:     Mental Status: She is alert and oriented to person, place, and time.  Psychiatric:        Behavior: Behavior normal.     BP 136/68   Pulse 76   Ht 5\' 10"  (1.778 m)   Wt 227 lb (103 kg)   SpO2 96%   BMI 32.57 kg/m  Wt Readings from Last 3 Encounters:  06/30/20 227 lb (103 kg)  11/25/19 219 lb (99.3 kg)  11/23/19 218 lb (98.9 kg)     Health Maintenance Due  Topic Date Due  . COVID-19 Vaccine (3 - Booster for Pfizer series) 11/14/2019    There are no preventive care reminders to display for this patient.  Lab Results  Component Value Date   TSH 1.17 09/23/2018   Lab Results  Component Value Date   WBC 4.2 11/23/2019   HGB 12.2 11/23/2019   HCT 38.0 11/23/2019   MCV 86.2 11/23/2019   PLT 268 11/23/2019   Lab Results  Component Value Date   NA 137 11/23/2019   K 4.0 11/23/2019   CO2 31 11/23/2019   GLUCOSE 112 (H) 11/23/2019   BUN 20 11/23/2019   CREATININE 1.06 (H) 11/23/2019   BILITOT 0.6 11/23/2019   ALKPHOS 94 10/30/2016   AST 29 11/23/2019   ALT 23 11/23/2019   PROT 7.4 11/23/2019   ALBUMIN 4.4 10/30/2016   CALCIUM 9.9 11/23/2019   Lab Results  Component Value  Date   CHOL 207 (  H) 11/23/2019   Lab Results  Component Value Date   HDL 46 (L) 11/23/2019   Lab Results  Component Value Date   LDLCALC 130 (H) 11/23/2019   Lab Results  Component Value Date   TRIG 171 (H) 11/23/2019   Lab Results  Component Value Date   CHOLHDL 4.5 11/23/2019   Lab Results  Component Value Date   HGBA1C 6.0 (A) 06/30/2020      Assessment & Plan:   Problem List Items Addressed This Visit      Cardiovascular and Mediastinum   HYPERTENSION, BENIGN    Well controlled. Continue current regimen. Follow up in  6 mo       Relevant Orders   BASIC METABOLIC PANEL WITH GFR     Endocrine   IFG (impaired fasting glucose) - Primary    A1C is up to 6.0 from previous of 5.6.  Her weight is also up about 8 pounds.  We discussed getting back on track with really portion control she just feels like her appetite has really ramped up lately so we discussed making sure that she is getting protein intake with each meal to help curb hunger.  She feels like she does a really good job with fluid intake.      Relevant Orders   POCT glycosylated hemoglobin (Hb A1C) (Completed)   BASIC METABOLIC PANEL WITH GFR     Genitourinary   Chronic kidney disease (CKD) stage G3b/A1, moderately decreased glomerular filtration rate (GFR) between 30-44 mL/min/1.73 square meter and albuminuria creatinine ratio less than 30 mg/g (HCC)    Due to recheck renal function.       Other Visit Diagnoses    Abnormal weight gain         AbNormal weight gain-we did discuss that she has gained a little bit of weight and really just getting back on track with her exercise again which she reports she has not been consistent with.  Also this will help with the A1c as well as help reduce her blood pressure as well we will keep a tight eye on that.  We also discussed discontinuing her aspirin as it is no longer recommended for prophylaxis.  No orders of the defined types were placed in this  encounter.   Follow-up: Return in about 6 months (around 12/26/2020) for Hypertension, Pre-diabetes.    Nani Gasser, MD

## 2020-06-30 NOTE — Assessment & Plan Note (Addendum)
A1C is up to 6.0 from previous of 5.6.  Her weight is also up about 8 pounds.  We discussed getting back on track with really portion control she just feels like her appetite has really ramped up lately so we discussed making sure that she is getting protein intake with each meal to help curb hunger.  She feels like she does a really good job with fluid intake.

## 2020-07-01 LAB — BASIC METABOLIC PANEL WITH GFR
BUN/Creatinine Ratio: 18 (calc) (ref 6–22)
BUN: 17 mg/dL (ref 7–25)
CO2: 29 mmol/L (ref 20–32)
Calcium: 10.6 mg/dL — ABNORMAL HIGH (ref 8.6–10.4)
Chloride: 97 mmol/L — ABNORMAL LOW (ref 98–110)
Creat: 0.97 mg/dL — ABNORMAL HIGH (ref 0.60–0.93)
GFR, Est African American: 66 mL/min/{1.73_m2} (ref >=60)
GFR, Est Non African American: 57 mL/min/{1.73_m2} — ABNORMAL LOW (ref >=60)
Glucose, Bld: 92 mg/dL (ref 65–99)
Potassium: 4.2 mmol/L (ref 3.5–5.3)
Sodium: 137 mmol/L (ref 135–146)

## 2020-07-01 NOTE — Progress Notes (Signed)
All labs are normal. 

## 2020-08-21 ENCOUNTER — Other Ambulatory Visit: Payer: Self-pay | Admitting: Family Medicine

## 2020-11-16 DIAGNOSIS — Z23 Encounter for immunization: Secondary | ICD-10-CM | POA: Diagnosis not present

## 2020-11-18 ENCOUNTER — Other Ambulatory Visit: Payer: Self-pay | Admitting: Family Medicine

## 2020-12-16 ENCOUNTER — Other Ambulatory Visit: Payer: Self-pay | Admitting: Family Medicine

## 2020-12-16 DIAGNOSIS — Z1231 Encounter for screening mammogram for malignant neoplasm of breast: Secondary | ICD-10-CM

## 2020-12-24 ENCOUNTER — Other Ambulatory Visit: Payer: Self-pay | Admitting: Family Medicine

## 2020-12-24 DIAGNOSIS — I10 Essential (primary) hypertension: Secondary | ICD-10-CM

## 2020-12-27 ENCOUNTER — Ambulatory Visit (INDEPENDENT_AMBULATORY_CARE_PROVIDER_SITE_OTHER): Payer: Medicare Other | Admitting: Sports Medicine

## 2020-12-27 ENCOUNTER — Ambulatory Visit (INDEPENDENT_AMBULATORY_CARE_PROVIDER_SITE_OTHER): Payer: Medicare Other

## 2020-12-27 ENCOUNTER — Other Ambulatory Visit: Payer: Self-pay

## 2020-12-27 DIAGNOSIS — M170 Bilateral primary osteoarthritis of knee: Secondary | ICD-10-CM

## 2020-12-27 DIAGNOSIS — M25561 Pain in right knee: Secondary | ICD-10-CM

## 2020-12-27 DIAGNOSIS — M25562 Pain in left knee: Secondary | ICD-10-CM | POA: Diagnosis not present

## 2020-12-27 DIAGNOSIS — Z09 Encounter for follow-up examination after completed treatment for conditions other than malignant neoplasm: Secondary | ICD-10-CM

## 2020-12-27 NOTE — Assessment & Plan Note (Addendum)
This is a very pleasant 75 year old female, known bilateral knee osteoarthritis, had a recent fall, now with increasing swelling, pain, locking right knee, today we did an aspiration, injection. Of note she did have some instability to varus stress as well as significant mechanical symptoms in the form of a positive McMurray's sign, catching and locking as we took the knee through the range of motion today. Updated x-rays today. If insufficient improvement over the next couple weeks we will proceed with MRI.

## 2020-12-27 NOTE — Progress Notes (Signed)
    Procedures performed today:    Procedure: Real-time Ultrasound Guided aspiration/injection of right knee Device: Samsung [id number]  Verbal informed consent obtained.  Time-out conducted.  Noted no overlying erythema, induration, or other signs of local infection.  Skin prepped in a sterile fashion.  Local anesthesia: Topical Ethyl chloride.  With sterile technique and under real time ultrasound guidance: Noted moderate effusion, aspirated 60 mL of clear, serosanguineous-colored fluid with an 18-gauge needle, syringe switched and 1 cc Kenalog 40, 2 cc lidocaine, 2 cc bupivacaine injected easily Completed without difficulty  Advised to call if fevers/chills, erythema, induration, drainage, or persistent bleeding.  Images permanently stored and available for review in PACS.  Impression: Technically successful ultrasound guided aspiration/injection.  Independent interpretation of notes and tests performed by another provider:   None.  Brief History, Exam, Impression, and Recommendations:    Primary osteoarthritis of both knees This is a very pleasant 75 year old female, known bilateral knee osteoarthritis, had a recent fall, now with increasing swelling, pain, locking right knee, today we did an aspiration, injection. Of note she did have some instability to varus stress as well as significant mechanical symptoms in the form of a positive McMurray's sign, catching and locking as we took the knee through the range of motion today. Updated x-rays today. If insufficient improvement over the next couple weeks we will proceed with MRI.    ___________________________________________ Ihor Austin. Benjamin Stain, M.D., ABFM., CAQSM. Primary Care and Sports Medicine [facility]  Adjunct Instructor of Family Medicine  [employer]

## 2021-01-05 ENCOUNTER — Ambulatory Visit (INDEPENDENT_AMBULATORY_CARE_PROVIDER_SITE_OTHER): Payer: Medicare Other | Admitting: Family Medicine

## 2021-01-05 ENCOUNTER — Other Ambulatory Visit: Payer: Self-pay

## 2021-01-05 ENCOUNTER — Encounter: Payer: Self-pay | Admitting: Family Medicine

## 2021-01-05 VITALS — BP 154/56 | HR 71 | Ht 70.0 in | Wt 224.0 lb

## 2021-01-05 DIAGNOSIS — R7301 Impaired fasting glucose: Secondary | ICD-10-CM | POA: Diagnosis not present

## 2021-01-05 DIAGNOSIS — I10 Essential (primary) hypertension: Secondary | ICD-10-CM | POA: Diagnosis not present

## 2021-01-05 DIAGNOSIS — Z23 Encounter for immunization: Secondary | ICD-10-CM | POA: Diagnosis not present

## 2021-01-05 DIAGNOSIS — E785 Hyperlipidemia, unspecified: Secondary | ICD-10-CM | POA: Diagnosis not present

## 2021-01-05 DIAGNOSIS — N1832 Chronic kidney disease, stage 3b: Secondary | ICD-10-CM

## 2021-01-05 LAB — POCT GLYCOSYLATED HEMOGLOBIN (HGB A1C): Hemoglobin A1C: 5.8 % — AB (ref 4.0–5.6)

## 2021-01-05 MED ORDER — VALSARTAN-HYDROCHLOROTHIAZIDE 320-12.5 MG PO TABS: 1.0000 | ORAL_TABLET | Freq: Every day | ORAL | 3 refills | Status: DC

## 2021-01-05 NOTE — Assessment & Plan Note (Signed)
Continue to follow kidney function every 6 months.

## 2021-01-05 NOTE — Progress Notes (Signed)
Established Patient Office Visit  Subjective:  Patient ID: [name], female    DOB: 06/07/1945  Age: 75 y.o. MRN: [id number]  CC:  Chief Complaint  Patient presents with   Hypertension     HPI [name] presents for   Hypertension- Pt denies chest pain, SOB, dizziness, or heart palpitations.  Taking meds as directed w/o problems.  Denies medication side effects.    Impaired fasting glucose-no increased thirst or urination. No symptoms consistent with hypoglycemia.  F/U CKD 3 -no recent changes or swelling.  She did fall recently.  She was racing to get out the door to get to [place] and fell forward and landed on both knees on the carpet.  She has been having more problems with her right knee and actually just saw our sports med doc and had some fluid drawn off last week.  She is at her x-rays done and plans to follow-up with him next week.  Past Medical History:  Diagnosis Date   Diverticulosis    Hiatal hernia    Hyperlipidemia    borderline   Hypertension    Spinal stenosis 2019    Past Surgical History:  Procedure Laterality Date   ABDOMINAL HYSTERECTOMY     TAH Pt. unsure if BSO   BREAST REDUCTION SURGERY  1994   BREAST SURGERY     Reduction   LIPOSUCTION     REDUCTION MAMMAPLASTY Bilateral 1994    Family History  Problem Relation Age of Onset   Breast cancer Sister 40       COD breast cancer and pneumonia   Hypertension Sister    Heart attack Father    Hypertension Father    Hypertension Mother    Kidney disease Mother    Breast cancer Other        ages 6 and 51--2 nieces   Hypertension Brother    Colon cancer Neg Hx    Esophageal cancer Neg Hx    Rectal cancer Neg Hx    Stomach cancer Neg Hx     Social History   Socioeconomic History   Marital status: Married    Spouse name: Jomarie Longs   Number of children: 0   Years of education: 13   Highest education level: 12th grade  Occupational History   Occupation: retired     Comment: land of [place] foreclosures  Tobacco Use   Smoking status: Never   Smokeless tobacco: Never  Vaping Use   Vaping Use: Never used  Substance and Sexual Activity   Alcohol use: No    Alcohol/week: 0.0 standard drinks   Drug use: No   Sexual activity: Not Currently    Partners: Male    Comment: HYST-1st intercourse 66 yo-5 partners  Other Topics Concern   Not on file  Social History Narrative   Exercising some 2 day a week,   walks on treadmill. Discussed with patient to increase frequency of exercise a week.   Social Determinants of Health   Financial Resource Strain: Not on file  Food Insecurity: Not on file  Transportation Needs: Not on file  Physical Activity: Not on file  Stress: Not on file  Social Connections: Not on file  Intimate Partner Violence: Not on file    Outpatient Medications Prior to Visit  Medication Sig Dispense Refill   amLODipine (NORVASC) 5 MG tablet Take 1 tablet (5 mg total) by mouth daily. NO REFILLS. NEEDS AN APPT. 90 tablet 0   Calcium Carbonate-Vitamin D  600-200 MG-UNIT TABS Take by mouth.     Ferrous Sulfate (IRON PO) Take 1 tablet by mouth daily.     MAGNESIUM PO Take 2 tablets by mouth daily.     Multiple Vitamins-Minerals (MULTIVITAMIN PO) Take by mouth.     Omega-3 Fatty Acids (FISH OIL) 1000 MG CPDR Take by mouth.     OVER THE COUNTER MEDICATION Curamin Extra Strength PO     Psyllium (METAMUCIL PO) Take by mouth.     VITAMIN D PO Take 1 tablet by mouth daily.     lisinopril-hydrochlorothiazide (ZESTORETIC) 20-12.5 MG tablet TAKE 1 TABLET BY MOUTH EVERY DAY 90 tablet 0   Flurandrenolide 4 MCG/SQCM TAPE Apply 1 each topically every 12 (twelve) hours. Over keloid scar. 60 each 1   OVER THE COUNTER MEDICATION Beet supplement (Patient not taking: Reported on 01/05/2021)     No facility-administered medications prior to visit.    Allergies  Allergen Reactions   Penicillins Rash    REACTION: rash    ROS Review of Systems     Objective:    Physical Exam Constitutional:      Appearance: Normal appearance. She is well-developed.  HENT:     Head: Normocephalic and atraumatic.  Cardiovascular:     Rate and Rhythm: Normal rate and regular rhythm.     Heart sounds: Normal heart sounds.  Pulmonary:     Effort: Pulmonary effort is normal.     Breath sounds: Normal breath sounds.  Musculoskeletal:        General: No swelling.  Skin:    General: Skin is warm and dry.  Neurological:     Mental Status: She is alert and oriented to person, place, and time.  Psychiatric:        Behavior: Behavior normal.    BP (!) 154/56   Pulse 71   Ht 5\' 10"  (1.778 m)   Wt 224 lb (101.6 kg)   SpO2 100%   BMI 32.14 kg/m  Wt Readings from Last 3 Encounters:  01/05/21 224 lb (101.6 kg)  06/30/20 227 lb (103 kg)  11/25/19 219 lb (99.3 kg)     Health Maintenance Due  Topic Date Due   COVID-19 Vaccine (3 - Booster for Pfizer series) 07/09/2019    There are no preventive care reminders to display for this patient.  Lab Results  Component Value Date   TSH 1.17 09/23/2018   Lab Results  Component Value Date   WBC 4.2 11/23/2019   HGB 12.2 11/23/2019   HCT 38.0 11/23/2019   MCV 86.2 11/23/2019   PLT 268 11/23/2019   Lab Results  Component Value Date   NA 137 06/30/2020   K 4.2 06/30/2020   CO2 29 06/30/2020   GLUCOSE 92 06/30/2020   BUN 17 06/30/2020   CREATININE 0.97 (H) 06/30/2020   BILITOT 0.6 11/23/2019   ALKPHOS 94 10/30/2016   AST 29 11/23/2019   ALT 23 11/23/2019   PROT 7.4 11/23/2019   ALBUMIN 4.4 10/30/2016   CALCIUM 10.6 (H) 06/30/2020   Lab Results  Component Value Date   CHOL 207 (H) 11/23/2019   Lab Results  Component Value Date   HDL 46 (L) 11/23/2019   Lab Results  Component Value Date   LDLCALC 130 (H) 11/23/2019   Lab Results  Component Value Date   TRIG 171 (H) 11/23/2019   Lab Results  Component Value Date   CHOLHDL 4.5 11/23/2019   Lab Results  Component Value  Date  HGBA1C 5.8 (A) 01/05/2021      Assessment & Plan:   Problem List Items Addressed This Visit       Cardiovascular and Mediastinum   HYPERTENSION, BENIGN    Uncontrolled.  Blood pressure is little elevated today and it was also elevated last saw her back in May she has a home blood pressure cuff but its not working well and she is actually planning on purchasing a new one she is currently on lisinopril HCT, and amlodipine.      Relevant Medications   valsartan-hydrochlorothiazide (DIOVAN-HCT) 320-12.5 MG tablet   Other Relevant Orders   Lipid Panel w/reflex Direct LDL   COMPLETE METABOLIC PANEL WITH GFR   CBC     Endocrine   IFG (impaired fasting glucose) - Primary    Her A1c looks great today at 5.8, down from previous of 6.0 which is fantastic.  Weight is down about 3 pounds as well which is great.  Continue work on [employer] and regular exercise.      Relevant Orders   Lipid Panel w/reflex Direct LDL   COMPLETE METABOLIC PANEL WITH GFR   CBC   POCT glycosylated hemoglobin (Hb A1C) (Completed)     Genitourinary   Chronic kidney disease (CKD) stage G3b/A1, moderately decreased glomerular filtration rate (GFR) between 30-44 mL/min/1.73 square meter and albuminuria creatinine ratio less than 30 mg/g (HCC)    Continue to follow kidney function every 6 months.      Relevant Orders   Lipid Panel w/reflex Direct LDL   COMPLETE METABOLIC PANEL WITH GFR   CBC   Urine Microalbumin w/creat. ratio     Other   Hyperlipidemia    To recheck cholesterol.  Last LDL was 130.  Hoping with improved A1c and her weight down a couple pounds that is even better.      Relevant Medications   valsartan-hydrochlorothiazide (DIOVAN-HCT) 320-12.5 MG tablet   Other Visit Diagnoses     Need for immunization against influenza       Relevant Orders   Flu Vaccine QUAD High Dose(Fluad) (Completed)       Meds ordered this encounter  Medications    valsartan-hydrochlorothiazide (DIOVAN-HCT) 320-12.5 MG tablet    Sig: Take 1 tablet by mouth daily.    Dispense:  90 tablet    Refill:  3     Follow-up: Return in about 25 days (around 01/30/2021) for Nurse BP check.    Nani Gasser, MD

## 2021-01-05 NOTE — Assessment & Plan Note (Signed)
To recheck cholesterol.  Last LDL was 130.  Hoping with improved A1c and her weight down a couple pounds that is even better.

## 2021-01-05 NOTE — Assessment & Plan Note (Signed)
Uncontrolled.  Blood pressure is little elevated today and it was also elevated last saw her back in May she has a home blood pressure cuff but its not working well and she is actually planning on purchasing a new one she is currently on lisinopril HCT, and amlodipine.

## 2021-01-05 NOTE — Patient Instructions (Signed)
Recommend switch the lisinopril HCTZ for a new medication called valsartan HCTZ.  It is somewhat similar but a little bit more powerful.  It still once a day and is generic.

## 2021-01-05 NOTE — Assessment & Plan Note (Signed)
Her A1c looks great today at 5.8, down from previous of 6.0 which is fantastic.  Weight is down about 3 pounds as well which is great.  Continue work on [employer] and regular exercise.

## 2021-01-10 ENCOUNTER — Ambulatory Visit (INDEPENDENT_AMBULATORY_CARE_PROVIDER_SITE_OTHER): Payer: Medicare Other | Admitting: Sports Medicine

## 2021-01-10 ENCOUNTER — Other Ambulatory Visit: Payer: Self-pay

## 2021-01-10 DIAGNOSIS — R7301 Impaired fasting glucose: Secondary | ICD-10-CM | POA: Diagnosis not present

## 2021-01-10 DIAGNOSIS — M170 Bilateral primary osteoarthritis of knee: Secondary | ICD-10-CM

## 2021-01-10 DIAGNOSIS — N1832 Chronic kidney disease, stage 3b: Secondary | ICD-10-CM | POA: Diagnosis not present

## 2021-01-10 DIAGNOSIS — I10 Essential (primary) hypertension: Secondary | ICD-10-CM | POA: Diagnosis not present

## 2021-01-10 LAB — COMPLETE METABOLIC PANEL WITH GFR
AG Ratio: 1.5 (calc) (ref 1.0–2.5)
ALT: 16 U/L (ref 6–29)
AST: 20 U/L (ref 10–35)
Albumin: 4.4 g/dL (ref 3.6–5.1)
Alkaline phosphatase (APISO): 91 U/L (ref 37–153)
BUN: 16 mg/dL (ref 7–25)
CO2: 32 mmol/L (ref 20–32)
Calcium: 9.7 mg/dL (ref 8.6–10.4)
Chloride: 99 mmol/L (ref 98–110)
Creat: 0.94 mg/dL (ref 0.60–1.00)
Globulin: 2.9 g/dL (calc) (ref 1.9–3.7)
Glucose, Bld: 115 mg/dL — ABNORMAL HIGH (ref 65–99)
Potassium: 4.3 mmol/L (ref 3.5–5.3)
Sodium: 138 mmol/L (ref 135–146)
Total Bilirubin: 0.7 mg/dL (ref 0.2–1.2)
Total Protein: 7.3 g/dL (ref 6.1–8.1)
eGFR: 63 mL/min/{1.73_m2} (ref >=60)

## 2021-01-10 LAB — CBC
HCT: 39.9 % (ref 35.0–45.0)
Hemoglobin: 12.7 g/dL (ref 11.7–15.5)
MCH: 27.4 pg (ref 27.0–33.0)
MCHC: 31.8 g/dL — ABNORMAL LOW (ref 32.0–36.0)
MCV: 86 fL (ref 80.0–100.0)
MPV: 9.7 fL (ref 7.5–12.5)
Platelets: 280 10*3/uL (ref 140–400)
RBC: 4.64 10*6/uL (ref 3.80–5.10)
RDW: 13.1 % (ref 11.0–15.0)
WBC: 5.2 10*3/uL (ref 3.8–10.8)

## 2021-01-10 LAB — LIPID PANEL W/REFLEX DIRECT LDL
Cholesterol: 230 mg/dL — ABNORMAL HIGH (ref <200)
HDL: 50 mg/dL (ref >=50)
LDL Cholesterol (Calc): 149 mg/dL (calc) — ABNORMAL HIGH
Non-HDL Cholesterol (Calc): 180 mg/dL (calc) — ABNORMAL HIGH (ref <130)
Total CHOL/HDL Ratio: 4.6 (calc) (ref <5.0)
Triglycerides: 169 mg/dL — ABNORMAL HIGH (ref <150)

## 2021-01-10 NOTE — Progress Notes (Signed)
    Procedures performed today:    None.  Independent interpretation of notes and tests performed by another provider:   None.  Brief History, Exam, Impression, and Recommendations:    Primary osteoarthritis of both knees Latifah returns, she is a very pleasant 75 year old female with known bilateral knee osteoarthritis, she had a fall prior to the last visit, she had some swelling, locking, I aspirated and injected her knee at the last visit she returns today pain and symptom-free, no further popping, catching, locking, ligamentous structures are stable, return as needed.    ___________________________________________ Ihor Austin. Benjamin Stain, M.D., ABFM., CAQSM. Primary Care and Sports Medicine [facility]  Adjunct Instructor of Family Medicine  [employer]

## 2021-01-10 NOTE — Assessment & Plan Note (Signed)
[name] returns, she is a very pleasant 75 year old female with known bilateral knee osteoarthritis, she had a fall prior to the last visit, she had some swelling, locking, I aspirated and injected her knee at the last visit she returns today pain and symptom-free, no further popping, catching, locking, ligamentous structures are stable, return as needed.

## 2021-01-11 LAB — MICROALBUMIN / CREATININE URINE RATIO
Creatinine, Urine: 233 mg/dL (ref 20–275)
Microalb Creat Ratio: 46 mcg/mg creat — ABNORMAL HIGH (ref <30)
Microalb, Ur: 10.7 mg/dL

## 2021-01-11 NOTE — Progress Notes (Signed)
Hi Malyna, you are spilling just a little bit of protein in your urine so I definitely want to keep an eye on this and plan to recheck again in about 6 months.  Your LDL cholesterol also went up significantly its been trending up over the last 2 years.  Your current cardiovascular risk score is around 20% for having a heart attack or stroke in the next 10 years.  So would highly recommend considering starting a statin at bedtime to help lower your cholesterol and your risk.  Your kidney function is stable.  Calcium level looks great this time.  Blood count is normal no sign of anemia.  The 10-year ASCVD risk score (Arnett DK, et al., 2019) is: 21.5%   Values used to calculate the score:     Age: 75 years     Sex: Female     Is Non-Hispanic African American: Yes     Diabetic: No     Tobacco smoker: No     Systolic Blood Pressure: 154 mmHg     Is BP treated: Yes     HDL Cholesterol: 50 mg/dL     Total Cholesterol: 230 mg/dL

## 2021-01-16 ENCOUNTER — Ambulatory Visit (INDEPENDENT_AMBULATORY_CARE_PROVIDER_SITE_OTHER): Payer: Medicare Other | Admitting: Family Medicine

## 2021-01-16 ENCOUNTER — Other Ambulatory Visit: Payer: Self-pay

## 2021-01-16 ENCOUNTER — Encounter: Payer: Self-pay | Admitting: Family Medicine

## 2021-01-16 VITALS — BP 135/63 | HR 77 | Ht 70.0 in | Wt 220.0 lb

## 2021-01-16 DIAGNOSIS — I10 Essential (primary) hypertension: Secondary | ICD-10-CM

## 2021-01-16 DIAGNOSIS — E785 Hyperlipidemia, unspecified: Secondary | ICD-10-CM

## 2021-01-16 NOTE — Progress Notes (Signed)
Established Patient Office Visit  Subjective:  Patient ID: [name], female    DOB: 1945-07-05  Age: 75 y.o. MRN: [id number]  CC:  Chief Complaint  Patient presents with   Hyperlipidemia   Hypertension    HPI [name] presents for F/U on her recent labs.  She also brought in all of her supplements that she currently takes to wanted to make sure that these were safe to use..   F/U high cholesterol levels.  She wanted to discuss the results today.  Had recommended a statin but she was very hesitant because of potential for side effects.  As her size is limited by her spinal stenosis.   Taking magnesium for muscle cramping.  Though she had severe cramps last week.  Hypertension- Pt denies chest pain, SOB, dizziness, or heart palpitations.  Taking meds as directed w/o problems.  Denies medication side effects.      Past Medical History:  Diagnosis Date   Diverticulosis    Hiatal hernia    Hyperlipidemia    borderline   Hypertension    Spinal stenosis 2019    Past Surgical History:  Procedure Laterality Date   ABDOMINAL HYSTERECTOMY     TAH Pt. unsure if BSO   BREAST REDUCTION SURGERY  1994   BREAST SURGERY     Reduction   LIPOSUCTION     REDUCTION MAMMAPLASTY Bilateral 1994    Family History  Problem Relation Age of Onset   Breast cancer Sister 71       COD breast cancer and pneumonia   Hypertension Sister    Heart attack Father    Hypertension Father    Hypertension Mother    Kidney disease Mother    Breast cancer Other        ages 72 and 6--2 nieces   Hypertension Brother    Colon cancer Neg Hx    Esophageal cancer Neg Hx    Rectal cancer Neg Hx    Stomach cancer Neg Hx     Social History   Socioeconomic History   Marital status: Married    Spouse name: Broadus John   Number of children: 0   Years of education: 13   Highest education level: 12th grade  Occupational History   Occupation: retired    Comment: 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 Discussed with patient to increase frequency of exercise a week.   Social Determinants of Health   Financial Resource Strain: Not on file  Food Insecurity: Not on file  Transportation Needs: Not on file  Physical Activity: Not on file  Stress: Not on file  Social Connections: Not on file  Intimate Partner Violence: Not on file    Outpatient Medications Prior to Visit  Medication Sig Dispense Refill   amLODipine (NORVASC) 5 MG tablet Take 1 tablet (5 mg total) by mouth daily. NO REFILLS. NEEDS AN APPT. 90 tablet 0   Calcium Carbonate-Vitamin D 600-200 MG-UNIT TABS Take by mouth.  Ferrous Gluconate (IRON 27 PO) Take 27 mg by mouth daily.     Folic Acid (FOLATE PO) Take 800 mcg by mouth daily.     magnesium oxide (MAG-OX) 400 MG tablet Take 400 mg by mouth daily.     Multiple Vitamins-Minerals (CENTRUM SILVER ADULT 50+) TABS Take 1 tablet by mouth daily.     Multiple Vitamins-Minerals (MULTIVITAMIN PO) Take by mouth.     Omega-3 Fatty Acids (FISH OIL) 1200 MG CAPS Take by mouth.     Psyllium (METAMUCIL PO) Take by mouth.     TURMERIC CURCUMIN PO Take 1,000 mg by mouth daily.     valsartan-hydrochlorothiazide (DIOVAN-HCT) 320-12.5 MG tablet Take 1 tablet by mouth daily. 90 tablet 3   vitamin C (ASCORBIC ACID) 500 MG tablet Take 500 mg by mouth daily.     VITAMIN D PO Take 1 tablet by mouth daily.     Vitamin E 90 MG (200 UNIT) CAPS Take 1 tablet by mouth daily.     Ferrous Sulfate (IRON PO) Take 1 tablet by mouth daily.     MAGNESIUM PO Take 2  tablets by mouth daily.     OVER THE COUNTER MEDICATION Curamin Extra Strength PO     No facility-administered medications prior to visit.    Allergies  Allergen Reactions   Penicillins Rash    REACTION: rash    ROS Review of Systems    Objective:    Physical Exam Vitals reviewed.  Constitutional:      Appearance: Normal appearance. She is well-developed.  HENT:     Head: Normocephalic and atraumatic.  Eyes:     Conjunctiva/sclera: Conjunctivae normal.  Pulmonary:     Effort: Pulmonary effort is normal.  Skin:    General: Skin is warm and dry.  Neurological:     Mental Status: She is alert and oriented to person, place, and time.  Psychiatric:        Behavior: Behavior normal.    BP 135/63   Pulse 77   Ht 5' 10" (1.778 m)   Wt 220 lb (99.8 kg)   SpO2 98%   BMI 31.57 kg/m  Wt Readings from Last 3 Encounters:  01/16/21 220 lb (99.8 kg)  01/05/21 224 lb (101.6 kg)  06/30/20 227 lb (103 kg)     Health Maintenance Due  Topic Date Due   COVID-19 Vaccine (3 - Booster for Pfizer series) 07/09/2019    There are no preventive care reminders to display for this patient.  Lab Results  Component Value Date   TSH 1.17 09/23/2018   Lab Results  Component Value Date   WBC 5.2 01/10/2021   HGB 12.7 01/10/2021   HCT 39.9 01/10/2021   MCV 86.0 01/10/2021   PLT 280 01/10/2021   Lab Results  Component Value Date   NA 138 01/10/2021   K 4.3 01/10/2021   CO2 32 01/10/2021   GLUCOSE 115 (H) 01/10/2021   BUN 16 01/10/2021   CREATININE 0.94 01/10/2021   BILITOT 0.7 01/10/2021   ALKPHOS 94 10/30/2016   AST 20 01/10/2021   ALT 16 01/10/2021   PROT 7.3 01/10/2021   ALBUMIN 4.4 10/30/2016   CALCIUM 9.7 01/10/2021   EGFR 63 01/10/2021   Lab Results  Component Value Date   CHOL 230 (H) 01/10/2021   Lab Results  Component Value Date   HDL 50 01/10/2021   Lab Results  Component Value Date   LDLCALC 149 (H) 01/10/2021   Lab Results  Component Value Date    TRIG 169 (H) 01/10/2021   Lab Results  Component Value Date   CHOLHDL 4.6 01/10/2021   Lab Results  Component Value Date   HGBA1C 5.8 (A) 01/05/2021      Assessment & Plan:   Problem List Items Addressed This Visit       Cardiovascular and Mediastinum   HYPERTENSION, BENIGN - Primary    Blood pressure looks much better today after recent changes with her medication.  She is can also try to find her home cuff so she can keep an eye on it.  We discussed that ideally we want her systolic pressure under 572.        Other   Hyperlipidemia    Her current 10-year cardiovascular risk score is 21.5% which is significantly high.  We discussed the role for a statin to reduce that risk.  She still would really like to work on diet and exercise so we discussed setting a time limit such as 8 to 9 weeks to really make some changes and then we can recheck her levels at that point but if she is not able to significantly reduce her risk to less than 10% and she will need to start a statin and patient is agreeable.  We discussed that we can always start with a low-dose and then work her way up since she is somewhat hesitant.  We did discuss potential side effects of the medication and things to look out for if she decides to start it.       In regards to her supplements I recommended that she discontinue the folic acid as she already has 620 mg of folic acid in her multivitamin.  Also encouraged her to stop the vitamin D as studies have shown increased cardiovascular disease.  In regard to the magnesium if she really feels like it is helping she could certainly continue it but if she is not sure that it is really improving her muscle cramping then I would recommend that she stop it.  Okay to take the vitamin C, turmeric, fish oil, multivitamin.  No orders of the defined types were placed in this encounter.   Follow-up: Return in about 8 weeks (around 03/13/2021) for go over cholesterol numbers.  Beatrice Lecher, MD

## 2021-01-16 NOTE — Assessment & Plan Note (Signed)
Blood pressure looks much better today after recent changes with her medication.  She is can also try to find her home cuff so she can keep an eye on it.  We discussed that ideally we want her systolic pressure under 140.

## 2021-01-16 NOTE — Assessment & Plan Note (Signed)
Her current 10-year cardiovascular risk score is 21.5% which is significantly high.  We discussed the role for a statin to reduce that risk.  She still would really like to work on diet and exercise so we discussed setting a time limit such as 8 to 9 weeks to really make some changes and then we can recheck her levels at that point but if she is not able to significantly reduce her risk to less than 10% and she will need to start a statin and patient is agreeable.  We discussed that we can always start with a low-dose and then work her way up since she is somewhat hesitant.  We did discuss potential side effects of the medication and things to look out for if she decides to start it.

## 2021-01-17 ENCOUNTER — Ambulatory Visit
Admission: RE | Admit: 2021-01-17 | Discharge: 2021-01-17 | Disposition: A | Discharge status: Home / Self Care | Payer: Medicare Other | Source: Ambulatory Visit | Attending: Family Medicine | Admitting: Family Medicine

## 2021-01-17 DIAGNOSIS — Z1231 Encounter for screening mammogram for malignant neoplasm of breast: Secondary | ICD-10-CM

## 2021-01-18 NOTE — Progress Notes (Signed)
Please call patient. Normal mammogram.  Repeat in 1 year.  

## 2021-01-23 ENCOUNTER — Other Ambulatory Visit: Payer: Self-pay

## 2021-01-23 ENCOUNTER — Encounter: Payer: Self-pay | Admitting: Nurse Practitioner

## 2021-01-23 ENCOUNTER — Ambulatory Visit (INDEPENDENT_AMBULATORY_CARE_PROVIDER_SITE_OTHER): Payer: Medicare Other | Admitting: Nurse Practitioner

## 2021-01-23 VITALS — BP 124/70 | Ht 68.5 in | Wt 221.0 lb

## 2021-01-23 DIAGNOSIS — Z01419 Encounter for gynecological examination (general) (routine) without abnormal findings: Secondary | ICD-10-CM | POA: Diagnosis not present

## 2021-01-23 DIAGNOSIS — Z780 Asymptomatic menopausal state: Secondary | ICD-10-CM

## 2021-01-23 NOTE — Progress Notes (Signed)
   [name] 01/30/1946 [id number]   History:  75 y.o. G0 presents for breast and pelvic exam without GYN complaints. Postmenopausal - no HRT. S/P TAH for fibroids. Normal pap and mammogram history. HTN, HLD managed by PCP.   Gynecologic History No LMP recorded. Patient has had a hysterectomy.   Contraception/Family planning: status post hysterectomy  Health Maintenance Last Pap: 2012. Results were: Normal Last mammogram: 01/17/2021. Results were: Normal Last colonoscopy: 12/06/2011. Results were: Normal, 10-year recall Last Dexa: 09/17/2017. Results were: Normal, 5-year recall  Past medical history, past surgical history, family history and social history were all reviewed and documented in the EPIC chart. Married. Retired. Sister and 2 nieces with history of breast cancer.   ROS:  A ROS was performed and pertinent positives and negatives are included.  Exam:  Vitals:   01/23/21 1140  BP: 124/70  Weight: 221 lb (100.2 kg)  Height: 5' 8.5" (1.74 m)    Body mass index is 33.11 kg/m.  General appearance:  Normal Thyroid:  Symmetrical, normal in size, without palpable masses or nodularity. Respiratory  Auscultation:  Clear without wheezing or rhonchi Cardiovascular  Auscultation:  Regular rate, without rubs, murmurs or gallops  Edema/varicosities:  Not grossly evident Abdominal  Soft,nontender, without masses, guarding or rebound.  Liver/spleen:  No organomegaly noted  Hernia:  None appreciated  Skin  Inspection:  Grossly normal   Breasts: Examined lying and sitting.   Right: Without masses, retractions, discharge or axillary adenopathy.   Left: Without masses, retractions, discharge or axillary adenopathy. Genitourinary   Inguinal/mons:  Normal without inguinal adenopathy  External genitalia:  Normal appearing vulva with no masses, tenderness, or lesions  BUS/Urethra/Skene's glands:  Normal  Vagina:  Normal appearing with normal color and discharge, no  lesions  Cervix:  Absent  Uterus:  Absent  Adnexa/parametria:     Rt: Normal in size, without masses or tenderness.   Lt: Normal in size, without masses or tenderness.  Anus and perineum: Normal  Digital rectal exam: Normal sphincter tone without palpated masses or tenderness  Patient informed chaperone available to be present for breast and pelvic exam. Patient has requested no chaperone to be present. Patient has been advised what will be completed during breast and pelvic exam.   Assessment/Plan:  75 y.o. G0 for breast and pelvic exam.   Well female exam with routine gynecological exam - Education provided on SBEs, importance of preventative screenings, current guidelines, high calcium diet, regular exercise, and multivitamin daily. Labs with PCP.  Postmenopausal - no HRT. S/P TAH for fibroids.   Screening for cervical cancer - Normal Pap history. No longer screening per guidelines.   Screening for breast cancer - Normal mammogram history.  Continue annual screenings.  Normal breast exam today.  Screening for colon cancer - 2013 colonoscopy. Will repeat at GI's recommended interval.   Screening for osteoporosis - Normal bone density in 2019. Will repeat at 5-year interval per recommendation.   Return in 2 years for breast and pelvic exam.        [name] [facility], 11:55 AM 01/23/2021

## 2021-01-30 ENCOUNTER — Ambulatory Visit: Payer: Medicare Other

## 2021-02-15 ENCOUNTER — Telehealth: Payer: Self-pay | Admitting: *Deleted

## 2021-02-15 NOTE — Telephone Encounter (Signed)
Pt called and stated that her insurance company informed her that she has CKD 3. I informed her that this was correct and that it was Dx 06/11/2014. She stated that she wasn't told that she had this. I read back Dr. Shelah Lewandowsky note to her from her labs at that time and informed her that her Kidney function is still mildly impaired but stable.   Advised her that we CANNOT CHANGE THE DX. If she should need a letter from [place] to send to her insurance company to let us know and we can write that for her. She is going to get this information and call back.

## 2021-02-21 ENCOUNTER — Encounter: Payer: Self-pay | Admitting: Family Medicine

## 2021-02-21 ENCOUNTER — Other Ambulatory Visit: Payer: Self-pay

## 2021-02-21 ENCOUNTER — Ambulatory Visit (INDEPENDENT_AMBULATORY_CARE_PROVIDER_SITE_OTHER): Payer: Medicare Other | Admitting: Family Medicine

## 2021-02-21 ENCOUNTER — Telehealth: Payer: Self-pay

## 2021-02-21 VITALS — BP 145/77 | HR 75 | Temp 97.9°F | Ht 69.0 in | Wt 217.1 lb

## 2021-02-21 DIAGNOSIS — M79675 Pain in left toe(s): Secondary | ICD-10-CM | POA: Diagnosis not present

## 2021-02-21 MED ORDER — MELOXICAM 15 MG PO TABS: 15.0000 mg | ORAL_TABLET | Freq: Every day | ORAL | 0 refills | Status: DC

## 2021-02-21 NOTE — Patient Instructions (Signed)
Meloxicam - for pain management and antiinflammatory effects. Do not take with any other antiinflammatories. You can use tylenol with this if needed.  Ice for 10-15 minutes a few times per day Elevate foot, avoid tight fitting shoes for now We will check blood work to make sure this isn't gout, but it sounds more like an inflammatory response due to the poor-fitting shoe. If no improvement in the next few weeks, we can always xray, though fracture less likely given no trauma.   Follow-up if symptoms worsen or fail to improve.

## 2021-02-21 NOTE — Telephone Encounter (Signed)
Lisa Leaver, NP with [employer] called to report patient BP today. Patient's BP was 150/80 ( Right arm) and 140/80 ( left arm). [name] stated patient is asymptomatic and  has been taking all of her medications. Forward to Dr. Linford Arnold for review.

## 2021-02-21 NOTE — Progress Notes (Signed)
Acute Office Visit  Subjective:    Patient ID: [name], female    DOB: 12-21-1945, 75 y.o.   MRN: [id number]  Chief Complaint  Patient presents with   Foot Swelling    Left great toe    HPI Patient is in today for left great toe pain/swelling.   Patient states that about 10 days ago, she went out to dinner with her sister and wore some new shoes that were a little too big. States her left foot, near great toe started bothering her that evening and has persisted. This past weekend the pain got very severe and any light palpation was 10/10 pain. States the area is swollen, red, slightly bruised compared to her opposite foot. The swelling and discoloration has started to improve, but is still 5/10 throbbing discomfort and swelling that makes it difficult for her to wear regular shoes. She denies any history of gout. States she did not eat anything unusual, but did have wine for the first time in awhile that night. She denies any trauma/falls/injury.        Past Medical History:  Diagnosis Date   Diverticulosis    Hiatal hernia    Hyperlipidemia    borderline   Hypertension    Spinal stenosis 2019    Past Surgical History:  Procedure Laterality Date   ABDOMINAL HYSTERECTOMY     TAH Pt. unsure if BSO   BREAST REDUCTION SURGERY  1994   BREAST SURGERY     Reduction   LIPOSUCTION     REDUCTION MAMMAPLASTY Bilateral 1994    Family History  Problem Relation Age of Onset   Breast cancer Sister 46       COD breast cancer and pneumonia   Hypertension Sister    Heart attack Father    Hypertension Father    Hypertension Mother    Kidney disease Mother    Breast cancer Other        ages 25 and 42--2 nieces   Hypertension Brother    Colon cancer Neg Hx    Esophageal cancer Neg Hx    Rectal cancer Neg Hx    Stomach cancer Neg Hx     Social History   Socioeconomic History   Marital status: Married    Spouse name: Broadus John   Number of children: 0   Years of  education: 13   Highest education level: 12th grade  Occupational History   Occupation: retired    Comment: land of [place] foreclosures  Tobacco Use   Smoking status: Never   Smokeless tobacco: Never  Vaping Use   Vaping Use: Never used  Substance and Sexual Activity   Alcohol use: No    Alcohol/week: 0.0 standard drinks   Drug use: No   Sexual activity: Not Currently    Partners: Male    Comment: HYST-1st intercourse 63 yo-5 partners  Other Topics Concern   Not on file  Social History Narrative   Exercising some 2 day a week,   walks on treadmill. Discussed with patient to increase frequency of exercise a week.   Social Determinants of Health   Financial Resource Strain: Not on file  Food Insecurity: Not on file  Transportation Needs: Not on file  Physical Activity: Not on file  Stress: Not on file  Social Connections: Not on file  Intimate Partner Violence: Not on file    Outpatient Medications Prior to Visit  Medication Sig Dispense Refill   amLODipine (NORVASC) 5 MG  tablet Take 1 tablet (5 mg total) by mouth daily. NO REFILLS. NEEDS AN APPT. 90 tablet 0   Calcium Carbonate-Vitamin D 600-200 MG-UNIT TABS Take by mouth.     Ferrous Gluconate (IRON 27 PO) Take 27 mg by mouth daily.     magnesium oxide (MAG-OX) 400 MG tablet Take 400 mg by mouth daily.     Multiple Vitamins-Minerals (CENTRUM SILVER ADULT 50+) TABS Take 1 tablet by mouth daily.     Multiple Vitamins-Minerals (MULTIVITAMIN PO) Take by mouth.     Omega-3 Fatty Acids (FISH OIL) 1200 MG CAPS Take by mouth.     Psyllium (METAMUCIL PO) Take by mouth.     TURMERIC CURCUMIN PO Take 1,000 mg by mouth daily.     valsartan-hydrochlorothiazide (DIOVAN-HCT) 320-12.5 MG tablet Take 1 tablet by mouth daily. 90 tablet 3   vitamin C (ASCORBIC ACID) 500 MG tablet Take 500 mg by mouth daily.     VITAMIN D PO Take 1 tablet by mouth daily.     Vitamin E 90 MG (200 UNIT) CAPS Take 1 tablet by mouth daily.     Folic Acid  (FOLATE PO) Take 800 mcg by mouth daily. (Patient not taking: Reported on 01/23/2021)     No facility-administered medications prior to visit.    Allergies  Allergen Reactions   Penicillins Rash    REACTION: rash    Review of Systems All review of systems negative except what is listed in the HPI     Objective:    Physical Exam Musculoskeletal:        General: Swelling and tenderness present. Normal range of motion.     Comments: Left 1st MTP with pain to joint palpation, mild swelling to MTP and across dorsal aspect of foot and arch with mild bruising, no warmth or fluctuance   Skin:    General: Skin is warm and dry.     Findings: Bruising present. No rash.  Neurological:     General: No focal deficit present.     Mental Status: She is oriented to person, place, and time. Mental status is at baseline.  Psychiatric:        Mood and Affect: Mood normal.        Behavior: Behavior normal.        Thought Content: Thought content normal.        Judgment: Judgment normal.       BP (!) 145/77 (BP Location: Left Arm, Patient Position: Sitting, Cuff Size: Large)    Pulse 75    Temp 97.9 F (36.6 C) (Oral)    Ht 5' 9" (1.753 m)    Wt 217 lb 1.9 oz (98.5 kg)    SpO2 96%    BMI 32.06 kg/m  Wt Readings from Last 3 Encounters:  02/21/21 217 lb 1.9 oz (98.5 kg)  01/23/21 221 lb (100.2 kg)  01/16/21 220 lb (99.8 kg)    There are no preventive care reminders to display for this patient.   There are no preventive care reminders to display for this patient.   Lab Results  Component Value Date   TSH 1.17 09/23/2018   Lab Results  Component Value Date   WBC 5.2 01/10/2021   HGB 12.7 01/10/2021   HCT 39.9 01/10/2021   MCV 86.0 01/10/2021   PLT 280 01/10/2021   Lab Results  Component Value Date   NA 138 01/10/2021   K 4.3 01/10/2021   CO2 32 01/10/2021   GLUCOSE 115 (H)  01/10/2021   BUN 16 01/10/2021   CREATININE 0.94 01/10/2021   BILITOT 0.7 01/10/2021   ALKPHOS 94  10/30/2016   AST 20 01/10/2021   ALT 16 01/10/2021   PROT 7.3 01/10/2021   ALBUMIN 4.4 10/30/2016   CALCIUM 9.7 01/10/2021   EGFR 63 01/10/2021   Lab Results  Component Value Date   CHOL 230 (H) 01/10/2021   Lab Results  Component Value Date   HDL 50 01/10/2021   Lab Results  Component Value Date   LDLCALC 149 (H) 01/10/2021   Lab Results  Component Value Date   TRIG 169 (H) 01/10/2021   Lab Results  Component Value Date   CHOLHDL 4.6 01/10/2021   Lab Results  Component Value Date   HGBA1C 5.8 (A) 01/05/2021       Assessment & Plan:   1. Great toe pain, left Meloxicam - for pain management and antiinflammatory effects. Do not take with any other antiinflammatories. You can use tylenol with this if needed.  Ice for 10-15 minutes a few times per day Elevate foot, avoid tight fitting shoes for now We will check blood work to make sure this isn't gout, but it sounds more like an inflammatory response due to the poor-fitting shoe. Best time to check for uric acid is 2 weeks after flare, so if elevated, we may need to repeat at that time for more accurate baseline levels.  If no improvement in the next few weeks, we can always xray, though fracture less likely given no trauma.   - Uric acid - meloxicam (MOBIC) 15 MG tablet; Take 1 tablet (15 mg total) by mouth daily.  Dispense: 30 tablet; Refill: 0  Follow-up if symptoms worsen or fail to improve.   Terrilyn Saver, NP

## 2021-02-21 NOTE — Telephone Encounter (Signed)
See how she would feel about increase dose of Diovan HCT?

## 2021-02-22 LAB — URIC ACID: Uric Acid, Serum: 8.3 mg/dL — ABNORMAL HIGH (ref 2.5–7.0)

## 2021-02-22 MED ORDER — COLCHICINE 0.6 MG PO TABS: 0.6000 mg | ORAL_TABLET | Freq: Every day | ORAL | 2 refills | Status: DC | PRN

## 2021-02-22 NOTE — Telephone Encounter (Signed)
Contacted patient regarding provider's note. Per patient, she will wait to discuss blood pressure medication changes in her upcoming appointment on 03/13/21.

## 2021-02-22 NOTE — Addendum Note (Signed)
Addended by: Hyman Hopes B on: 02/22/2021 09:01 AM   Modules accepted: Orders

## 2021-02-23 ENCOUNTER — Telehealth: Payer: Self-pay | Admitting: Family Medicine

## 2021-02-23 ENCOUNTER — Other Ambulatory Visit: Payer: Self-pay

## 2021-02-23 ENCOUNTER — Ambulatory Visit (INDEPENDENT_AMBULATORY_CARE_PROVIDER_SITE_OTHER): Payer: Medicare Other

## 2021-02-23 DIAGNOSIS — S99922A Unspecified injury of left foot, initial encounter: Secondary | ICD-10-CM

## 2021-02-23 DIAGNOSIS — M79675 Pain in left toe(s): Secondary | ICD-10-CM | POA: Diagnosis not present

## 2021-02-23 NOTE — Telephone Encounter (Signed)
Patient called back stating that she would like to proceed with the foot xray. Order placed. Will update her with results.   Lollie Marrow Reola Calkins, DNP, FNP-C

## 2021-02-24 ENCOUNTER — Telehealth: Payer: Self-pay

## 2021-02-24 NOTE — Telephone Encounter (Signed)
Patient advised of results and recommendations, verbalized understanding. No further inquiries during phone call.  

## 2021-02-24 NOTE — Telephone Encounter (Signed)
Patient called requesting her X-ray results for her left foot. Pls advise, thanks.

## 2021-03-13 ENCOUNTER — Ambulatory Visit: Payer: Medicare Other | Admitting: Family Medicine

## 2021-03-16 ENCOUNTER — Encounter: Payer: Self-pay | Admitting: Family Medicine

## 2021-03-16 ENCOUNTER — Ambulatory Visit (INDEPENDENT_AMBULATORY_CARE_PROVIDER_SITE_OTHER): Payer: Medicare Other | Admitting: Family Medicine

## 2021-03-16 ENCOUNTER — Other Ambulatory Visit: Payer: Self-pay

## 2021-03-16 VITALS — BP 153/73 | HR 78 | Ht 69.0 in | Wt 215.0 lb

## 2021-03-16 DIAGNOSIS — E785 Hyperlipidemia, unspecified: Secondary | ICD-10-CM

## 2021-03-16 DIAGNOSIS — R7301 Impaired fasting glucose: Secondary | ICD-10-CM | POA: Diagnosis not present

## 2021-03-16 DIAGNOSIS — I10 Essential (primary) hypertension: Secondary | ICD-10-CM | POA: Diagnosis not present

## 2021-03-16 LAB — LIPID PANEL W/REFLEX DIRECT LDL
Cholesterol: 213 mg/dL — ABNORMAL HIGH (ref <200)
HDL: 47 mg/dL — ABNORMAL LOW (ref >=50)
LDL Cholesterol (Calc): 135 mg/dL (calc) — ABNORMAL HIGH
Non-HDL Cholesterol (Calc): 166 mg/dL (calc) — ABNORMAL HIGH (ref <130)
Total CHOL/HDL Ratio: 4.5 (calc) (ref <5.0)
Triglycerides: 179 mg/dL — ABNORMAL HIGH (ref <150)

## 2021-03-16 MED ORDER — AMLODIPINE BESYLATE 10 MG PO TABS: 10.0000 mg | ORAL_TABLET | Freq: Every day | ORAL | 1 refills | Status: DC

## 2021-03-16 NOTE — Progress Notes (Signed)
° °Established Patient Office Visit ° °Subjective:  °Patient ID: [name], female    DOB: 04/06/1945  Age: 75 y.o. MRN: [id number] ° °CC:  °Chief Complaint  °Patient presents with  ° Follow-up  ° ° °HPI °Berlie M Struckman presents for  ° °Hypertension- Pt denies chest pain, SOB, dizziness, or heart palpitations.  Taking meds as directed w/o problems.  Denies medication side effects.  Says has been a little bit limited because of her toe pain she has had some pain and arthritis and could even get a shoe on the last time she was here.  But she says it is gradually getting a lot better. ° °She also wanted to discuss her lipid levels.  We drew a lipid panel back in November.  Her 10-year cardiovascular risk core was 21.5% and I had highly recommended that she consider starting a statin.  She was not interested at the time but said she was willing to discuss it at her next appointment. ° °Past Medical History:  °Diagnosis Date  ° Diverticulosis   ° Hiatal hernia   ° Hyperlipidemia   ° borderline  ° Hypertension   ° Spinal stenosis 2019  ° ° °Past Surgical History:  °Procedure Laterality Date  ° ABDOMINAL HYSTERECTOMY    ° TAH Pt. unsure if BSO  ° BREAST REDUCTION SURGERY  1994  ° BREAST SURGERY    ° Reduction  ° LIPOSUCTION    ° REDUCTION MAMMAPLASTY Bilateral 1994  ° ° °Family History  °Problem Relation Age of Onset  ° Breast cancer Sister 62  °     COD breast cancer and pneumonia  ° Hypertension Sister   ° Heart attack Father   ° Hypertension Father   ° Hypertension Mother   ° Kidney disease Mother   ° Breast cancer Other   °     ages 49 and 51--2 nieces  ° Hypertension Brother   ° Colon cancer Neg Hx   ° Esophageal cancer Neg Hx   ° Rectal cancer Neg Hx   ° Stomach cancer Neg Hx   ° ° °Social History  ° °Socioeconomic History  ° Marital status: Married  °  Spouse name: Joseph  ° Number of children: 0  ° Years of education: 13  ° Highest education level: 12th grade  °Occupational History  ° Occupation: retired   °  Comment: [employer] fore[employer]osures  °Tobacco Use  ° Smoking status: Never  ° Smokeless tobacco: Never  °Vaping Use  ° Vaping Use: Never used  °Substance and Sexual Activity  ° Alcohol use: No  °  Alcohol/week: 0.0 standard drinks  ° Drug use: No  ° Sexual activity: Not Currently  °  Partners: Male  °  Comment: HYST-1st intercourse 18 yo-5 partners  °Other Topics Concern  ° Not on file  °Social History Narrative  ° Exercising some 2 day a week,   walks on treadmill. Discussed with patient to increase frequency of exercise a week.  ° °Social Determinants of Health  ° °Financial Resource Strain: Not on file  °Food Insecurity: Not on file  °Transportation Needs: Not on file  °Physical Activity: Not on file  °Stress: Not on file  °Social Connections: Not on file  °Intimate Partner Violence: Not on file  ° ° °Outpatient Medications Prior to Visit  °Medication Sig Dispense Refill  ° Calcium Carbonate-Vitamin D 600-200 MG-UNIT TABS Take by mouth.    ° colchicine 0.6 MG tablet Take 1 tablet (0.6 mg   Established Patient Office Visit  Subjective:  Patient ID: [name], female    DOB: 12/17/1945  Age: 76 y.o. MRN: [id number]  CC:  Chief Complaint  Patient presents with   Follow-up    HPI [name] presents for   Hypertension- Pt denies chest pain, SOB, dizziness, or heart palpitations.  Taking meds as directed w/o problems.  Denies medication side effects.  Says has been a little bit limited because of her toe pain she has had some pain and arthritis and could even get a shoe on the last time she was here.  But she says it is gradually getting a lot better.  She also wanted to discuss her lipid levels.  We drew a lipid panel back in November.  Her 10-year cardiovascular risk core was 21.5% and I had highly recommended that she consider starting a statin.  She was not interested at the time but said she was willing to discuss it at her next appointment.  Past Medical History:  Diagnosis Date   Diverticulosis    Hiatal hernia    Hyperlipidemia    borderline   Hypertension    Spinal stenosis 2019    Past Surgical History:  Procedure Laterality Date   ABDOMINAL HYSTERECTOMY     TAH Pt. unsure if BSO   BREAST REDUCTION SURGERY  1994   BREAST SURGERY     Reduction   LIPOSUCTION     REDUCTION MAMMAPLASTY Bilateral 1994    Family History  Problem Relation Age of Onset   Breast cancer Sister 50       COD breast cancer and pneumonia   Hypertension Sister    Heart attack Father    Hypertension Father    Hypertension Mother    Kidney disease Mother    Breast cancer Other        ages 71 and 22--2 nieces   Hypertension Brother    Colon cancer Neg Hx    Esophageal cancer Neg Hx    Rectal cancer Neg Hx    Stomach cancer Neg Hx     Social History   Socioeconomic History   Marital status: Married    Spouse name: Broadus John   Number of children: 0   Years of education: 13   Highest education level: 12th grade  Occupational History   Occupation: retired     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 Discussed with patient to increase frequency of exercise a week.   Social Determinants of Health   Financial Resource Strain: Not on file  Food Insecurity: Not on file  Transportation Needs: Not on file  Physical Activity: Not on file  Stress: Not on file  Social Connections: Not on file  Intimate Partner Violence: Not on file    Outpatient Medications Prior to Visit  Medication Sig Dispense Refill   Calcium Carbonate-Vitamin D 600-200 MG-UNIT TABS Take by mouth.     colchicine 0.6 MG tablet Take 1 tablet (0.6  Established Patient Office Visit  Subjective:  Patient ID: [name], female    DOB: 12/17/1945  Age: 76 y.o. MRN: [id number]  CC:  Chief Complaint  Patient presents with   Follow-up    HPI [name] presents for   Hypertension- Pt denies chest pain, SOB, dizziness, or heart palpitations.  Taking meds as directed w/o problems.  Denies medication side effects.  Says has been a little bit limited because of her toe pain she has had some pain and arthritis and could even get a shoe on the last time she was here.  But she says it is gradually getting a lot better.  She also wanted to discuss her lipid levels.  We drew a lipid panel back in November.  Her 10-year cardiovascular risk core was 21.5% and I had highly recommended that she consider starting a statin.  She was not interested at the time but said she was willing to discuss it at her next appointment.  Past Medical History:  Diagnosis Date   Diverticulosis    Hiatal hernia    Hyperlipidemia    borderline   Hypertension    Spinal stenosis 2019    Past Surgical History:  Procedure Laterality Date   ABDOMINAL HYSTERECTOMY     TAH Pt. unsure if BSO   BREAST REDUCTION SURGERY  1994   BREAST SURGERY     Reduction   LIPOSUCTION     REDUCTION MAMMAPLASTY Bilateral 1994    Family History  Problem Relation Age of Onset   Breast cancer Sister 50       COD breast cancer and pneumonia   Hypertension Sister    Heart attack Father    Hypertension Father    Hypertension Mother    Kidney disease Mother    Breast cancer Other        ages 71 and 22--2 nieces   Hypertension Brother    Colon cancer Neg Hx    Esophageal cancer Neg Hx    Rectal cancer Neg Hx    Stomach cancer Neg Hx     Social History   Socioeconomic History   Marital status: Married    Spouse name: Broadus John   Number of children: 0   Years of education: 13   Highest education level: 12th grade  Occupational History   Occupation: retired     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 Discussed with patient to increase frequency of exercise a week.   Social Determinants of Health   Financial Resource Strain: Not on file  Food Insecurity: Not on file  Transportation Needs: Not on file  Physical Activity: Not on file  Stress: Not on file  Social Connections: Not on file  Intimate Partner Violence: Not on file    Outpatient Medications Prior to Visit  Medication Sig Dispense Refill   Calcium Carbonate-Vitamin D 600-200 MG-UNIT TABS Take by mouth.     colchicine 0.6 MG tablet Take 1 tablet (0.6

## 2021-03-16 NOTE — Assessment & Plan Note (Signed)
Says that the home health nurse from the insurance company came out and told her that her blood sugar level was elevated.  She is not quite due for repeat A1c she is actually been try to eat more healthy and has lost a couple pounds since I last saw her.  We will plan to repeat her A1c at her follow-up next month.

## 2021-03-16 NOTE — Assessment & Plan Note (Signed)
Uncontrolled.  Discussed options we have the option of increasing her HCTZ or increasing her amlodipine.  She would prefer to try to increase her amlodipine to 10 mg.  New prescription sent to pharmacy.  I will see her back in about 4 weeks.

## 2021-03-16 NOTE — Assessment & Plan Note (Signed)
Discussed that her cardiovascular risk score was greater than 20% and what that means.  She would really like to recheck her lipids today as she has been making some very purposeful changes with her diet and she is even lost weight.  She would like to see how far she can get with her lifestyle changes first before considering a statin.

## 2021-03-17 NOTE — Progress Notes (Signed)
Hi Morgyn, you have made a little bit of progress on the LDL it was 149 it is down to 135.  It decreased her risk slightly from around 20% down to 19% but it still really significant still encourage you to just consider taking a statin.  You can give it some thought and let me know or we can discuss it again when I see you back.  The 10-year ASCVD risk score (Arnett DK, et al., 2019) is: 19.5%   Values used to calculate the score:     Age: 76 years     Sex: Female     Is Non-Hispanic African American: Yes     Diabetic: No     Tobacco smoker: No     Systolic Blood Pressure: 153 mmHg     Is BP treated: Yes     HDL Cholesterol: 47 mg/dL     Total Cholesterol: 213 mg/dL

## 2021-03-20 ENCOUNTER — Other Ambulatory Visit: Payer: Self-pay | Admitting: Family Medicine

## 2021-03-20 DIAGNOSIS — M79675 Pain in left toe(s): Secondary | ICD-10-CM

## 2021-03-29 ENCOUNTER — Other Ambulatory Visit: Payer: Self-pay | Admitting: Family Medicine

## 2021-03-29 DIAGNOSIS — I10 Essential (primary) hypertension: Secondary | ICD-10-CM

## 2021-04-05 ENCOUNTER — Other Ambulatory Visit: Payer: Self-pay

## 2021-04-05 ENCOUNTER — Ambulatory Visit (INDEPENDENT_AMBULATORY_CARE_PROVIDER_SITE_OTHER): Payer: Medicare Other | Admitting: Family Medicine

## 2021-04-05 VITALS — BP 143/74 | HR 80 | Ht 69.0 in | Wt 215.0 lb

## 2021-04-05 DIAGNOSIS — Z0000 Encounter for general adult medical examination without abnormal findings: Secondary | ICD-10-CM | POA: Diagnosis not present

## 2021-04-05 NOTE — Progress Notes (Signed)
MEDICARE ANNUAL WELLNESS VISIT  04/05/2021  Subjective:  [name] is a 76 y.o. female patient of Metheney, Barbarann Ehlersatherine D, MD who had a Medicare Annual Wellness Visit today. [name] is Retired and lives with their spouse. she has 0 children. she reports that she is socially active and does interact with friends/family regularly. she is minimally physically active and enjoys volunteering and doing church activities..  Patient Care Team: Agapito GamesMetheney, Catherine D, MD as PCP - General Maple HudsonYoung, Davonna BellingNancy J, NP (Inactive) as Nurse Practitioner (Obstetrics and Gynecology)  Advanced Directives 04/05/2021 12/22/2018 11/13/2017 11/12/2017 06/10/2014  Does Patient Have a Medical Advance Directive? Yes No;Yes No No No  Type of Advance Directive Living will;Healthcare Power of [employer] Corporationtt[employer] Healthcare Power of [place]orney;Living will - - -  Does patient want to make changes to medical advance directive? No - Patient declined No - Patient declined - - -  Copy of Healthcare Power of Attorney in Chart? No - copy requested No - copy requested - - -  Would patient like information on creating a medical advance directive? - No - Patient declined No - Patient declined Yes (MAU/Ambulatory/Procedural Areas - Information given) Yes - Educational materials given    Hospital Utilization Over the Past 12 Months: # of hospitalizations or ER visits: 0 # of surgeries: 0  Review of Systems    Patient reports that her overall health is unchanged when compared to last year.  Review of Systems: History obtained from chart review and the patient  All other systems negative.  Pain Assessment Pain : No/denies pain     Current Medications & Allergies (verified) Allergies as of 04/05/2021       Reactions   Penicillins Rash   REACTION: rash        Medication List        Accurate as of April 05, 2021  9:23 AM. If you have any questions, ask your nurse or doctor.          amLODipine 10 MG tablet Commonly known as:  NORVASC Take 1 tablet (10 mg total) by mouth daily.   Calcium Carbonate-Vitamin [name] 600-200 MG-UNIT Tabs Take by mouth.   Centrum Silver Adult 50+ Tabs Take 1 tablet by mouth daily.   colchicine 0.6 MG tablet Take 1 tablet (0.6 mg total) by mouth daily as needed. Can take up to 1 tablet twice a day during acute gout flares   Fish Oil 1200 MG Caps Take by mouth.   IRON 27 PO Take 27 mg by mouth daily.   magnesium oxide 400 MG tablet Commonly known as: MAG-OX Take 400 mg by mouth daily.   meloxicam 15 MG tablet Commonly known as: MOBIC TAKE 1 TABLET (15 MG TOTAL) BY MOUTH DAILY.   METAMUCIL PO Take by mouth.   MULTIVITAMIN PO Take by mouth.   TURMERIC CURCUMIN PO Take 1,000 mg by mouth daily.   valsartan-hydrochlorothiazide 320-12.5 MG tablet Commonly known as: DIOVAN-HCT Take 1 tablet by mouth daily.   vitamin C 500 MG tablet Commonly known as: ASCORBIC ACID Take 500 mg by mouth daily.   VITAMIN [name] PO Take 1 tablet by mouth daily.   Vitamin E 90 MG (200 UNIT) Caps Take 1 tablet by mouth daily.        History (reviewed): Past Medical History:  Diagnosis Date   Diverticulosis    Hiatal hernia    Hyperlipidemia    borderline   Hypertension    Spinal stenosis 2019   Past Surgical History:  Procedure Laterality Date   ABDOMINAL HYSTERECTOMY     TAH Pt. unsure if BSO   BREAST REDUCTION SURGERY  1994   BREAST SURGERY     Reduction   LIPOSUCTION     REDUCTION MAMMAPLASTY Bilateral 1994   Family History  Problem Relation Age of Onset   Breast cancer Sister 27       COD breast cancer and pneumonia   Hypertension Sister    Heart attack Father    Hypertension Father    Hypertension Mother    Kidney disease Mother    Breast cancer Other        ages 89 and 51--2 nieces   Hypertension Brother    Colon cancer Neg Hx    Esophageal cancer Neg Hx    Rectal cancer Neg Hx    Stomach cancer Neg Hx    Social History   Socioeconomic History   Marital  status: Married    Spouse name: Jomarie Longs   Number of children: 0   Years of education: 13   Highest education level: 12th grade  Occupational History   Occupation: retired    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 She enjoys to volunteering and doing church activities.   Social Determinants of Health   Financial Resource Strain: Low Risk    Difficulty of Paying Living Expenses: Not hard at all  Food Insecurity: No Food Insecurity   Worried About Programme researcher, broadcasting/film/video in the Last Year: Never true   Ran Out of Food in the Last Year: Never true  Transportation Needs: No Transportation Needs   Lack of Transportation (Medical): No   Lack of Transportation (Non-Medical): No  Physical Activity: Inactive   Days of Exercise per Week: 0 days   Minutes of Exercise per Session: 0 min  Stress: No Stress Concern Present   Feeling of Stress : Not at all  Social Connections: Socially Integrated   Frequency of Communication with Friends and Family: More than three times a week   Frequency of Social Gatherings with Friends and Family: Twice a week   Attends Religious Services: More than 4 times per year   Active Member of [employer] or Organizations: Yes   Attends Banker Meetings: More than 4 times per year   Marital Status: Married    Activities of Daily Living In your present state of health, do you have any difficulty performing the following activities: 04/05/2021  Hearing? N  Vision? N  Difficulty concentrating or making decisions? N  Walking or climbing stairs? N  Dressing or bathing? N   Doing errands, shopping? N  Preparing Food and eating ? N  Using the Toilet? N  Managing your Medications? N  Managing your Finances? N  Housekeeping or managing your Housekeeping? N  Some recent data might be hidden    Patient Education/Literacy How often do you need to have someone help you when you read instructions, pamphlets, or other written materials from your doctor or pharmacy?: 1 - Never What is  the last grade level you completed in school?: 12th grade and some college.  Exercise Current Exercise Habits: Home exercise routine, Type of exercise: treadmill;stretching, Time (Minutes): 20, Frequency (Times/Week): 2, Weekly Exercise (Minutes/Week): 40, Intensity: Mild, Exercise limited by: orthopedic condition(s) (spinal stenosis)  Diet Patient reports consuming 2 meals a day and 2 snack(s) a day Patient reports that her primary diet is: Regular Patient reports that she does have regular access to food.   Depression Screen PHQ 2/9 Scores 04/05/2021 03/16/2021 06/30/2020 12/22/2018 10/15/2018 11/12/2017 04/25/2017  PHQ - 2 Score 0 0 0 0 0 0 0  PHQ- 9 Score - - - - - - 1     Fall Risk Fall Risk  04/05/2021 03/16/2021 06/30/2020 12/22/2018 10/15/2018  Falls in the past year? 1 1 0 0 0  Comment - - - - -  Number falls in past yr: 0 0 0 0 0  Injury with Fall? 0 1 0 0 0  Risk for fall due to : History of fall(s);Impaired balance/gait No Fall Risks No Fall Risks - -  Follow up Falls evaluation completed;Education provided;Falls prevention discussed Falls evaluation completed Falls prevention discussed;Falls evaluation completed Falls prevention discussed -     Objective:   BP (!) 143/74 (BP Location: Right Arm, Patient Position: Sitting, Cuff Size: Normal)    Pulse 80    Ht 5\' 9"  (1.753 m)    Wt 215 lb (97.5 kg)    SpO2 98%    BMI 31.75 kg/m   Last Weight  Most recent update: 04/05/2021  9:06 AM    Weight  97.5 kg (215 lb)             Body mass index is 31.75 kg/m.  Hearing/Vision   Logann did not have difficulty with hearing/understanding during the face-to-face interview Zuleima did not have difficulty with her vision during the face-to-face interview Reports that she has had a formal eye exam by an eye care professional within the past year Reports that she has not had a formal hearing evaluation within the past year  Cognitive Function: 6CIT Screen 04/05/2021 12/22/2018 10/15/2018 11/12/2017  What Year? 0 points 0 points 0 points 0 points  What month? 0 points 0 points 0 points 0 points  What time? 0 points 0 points 0 points 0 points  Count back from 20 0 points 0 points 0 points 0 points  Months in reverse 2 points 2 points 0 points 2 points  Repeat phrase 0 points 0 points 2 points 0 points  Total Score 2 2 2 2     Normal Cognitive Function Screening: Yes (Normal:0-7, Significant for Dysfunction: >8)  Immunization & Health Maintenance Record Immunization History  Administered Date(s) Administered   Fluad Quad(high Dose 65+) 10/15/2018, 11/23/2019, 01/05/2021   Influenza Split 01/30/2011   Influenza, High Dose Seasonal PF 11/26/2017   Influenza,inj,Quad PF,6+ Mos 12/02/2012, 11/17/2013, 12/13/2014, 10/20/2015, 10/22/2016   Influenza,inj,quad, With Preservative 11/26/2017   PFIZER(Purple Top)SARS-COV-2 Vaccination 03/25/2019, 04/15/2019, 01/06/2020, 06/22/2020, 11/16/2020   Pneumococcal Conjugate-13 06/10/2014   Pneumococcal Polysaccharide-23 01/30/2011   Td 02/27/1995, 02/27/2007   Tdap 11/25/2018   Zoster Recombinat (Shingrix) 11/25/2018, 01/26/2019    Health Maintenance  Topic Date Due   COLONOSCOPY (Pts 45-30yrs Insurance coverage will need to be confirmed)  12/05/2021   DEXA SCAN  09/18/2022   TETANUS/TDAP  11/24/2028   Pneumonia Vaccine 54+ Years old  Completed   INFLUENZA VACCINE  Completed   COVID-19 Vaccine  Completed   Hepatitis C Screening  Completed   Zoster Vaccines- Shingrix  Completed   HPV VACCINES  Aged Out       Assessment  This  is a routine wellness examination for [employer].  Health Maintenance: Due or Overdue There are no preventive care reminders to display for this patient.   Durene Romans Jasek does not need a referral for Community Assistance: Care Management:   no Social Work:    no Prescription Assistance:  no Nutrition/Diabetes Education:  no   Plan:  Personalized Goals  Goals Addressed               This Visit's Progress     Patient Stated (pt-stated)        Would like to loose about 20 lbs.        Personalized Health Maintenance & Screening Recommendations  Colorectal cancer screening - due in October.  Lung Cancer Screening Recommended: no (Low Dose CT Chest recommended if Age 29-80 years, 30 pack-year currently smoking OR have quit w/in past 15 years) Hepatitis C Screening recommended: no HIV Screening recommended: no  Advanced Directives: Written information was not given per the patient's request.  Referrals & Orders No orders of the defined types were placed in this encounter.   Follow-up Plan Follow-up with Agapito Games, MD as planned Schedule your colonoscopy in October, 2023. Medicare wellness in one year.  AVS printed and given to the patient.   I have personally reviewed and noted the following in the patients chart:   Medical and social history Use of alcohol, tobacco or illicit drugs  Current medications and supplements Functional ability and status Nutritional status Physical activity Advanced directives List of other physicians Hospitalizations, surgeries, and ER visits in previous 12 months Vitals Screenings to include cognitive, depression, and falls Referrals and appointments  In addition, I have reviewed and discussed with patient certain preventive protocols, quality metrics, and best practice recommendations. A written personalized care plan for preventive services as well as general preventive health recommendations were provided  to patient.     Modesto Charon, RN  04/05/2021

## 2021-04-05 NOTE — Patient Instructions (Addendum)
[facility] Maintenance Summary and Written Plan of Care  [name] ,  Thank you for allowing me to perform your Medicare Annual Wellness Visit and for your ongoing commitment to your health.   Health Maintenance & Immunization History Health Maintenance  Topic Date Due   COLONOSCOPY (Pts 45-30yrs Insurance coverage will need to be confirmed)  12/05/2021   DEXA SCAN  09/18/2022   TETANUS/TDAP  11/24/2028   Pneumonia Vaccine 49+ Years old  Completed   INFLUENZA VACCINE  Completed   COVID-19 Vaccine  Completed   Hepatitis C Screening  Completed   Zoster Vaccines- Shingrix  Completed   HPV VACCINES  Aged Out   Immunization History  Administered Date(s) Administered   Fluad Quad(high Dose 65+) 10/15/2018, 11/23/2019, 01/05/2021   Influenza Split 01/30/2011   Influenza, High Dose Seasonal PF 11/26/2017   Influenza,inj,Quad PF,6+ Mos 12/02/2012, 11/17/2013, 12/13/2014, 10/20/2015, 10/22/2016   Influenza,inj,quad, With Preservative 11/26/2017   PFIZER(Purple Top)SARS-COV-2 Vaccination 03/25/2019, 04/15/2019, 01/06/2020, 06/22/2020, 11/16/2020   Pneumococcal Conjugate-13 06/10/2014   Pneumococcal Polysaccharide-23 01/30/2011   Td 02/27/1995, 02/27/2007   Tdap 11/25/2018   Zoster Recombinat (Shingrix) 11/25/2018, 01/26/2019    These are the patient goals that we discussed:  Goals Addressed               This Visit's Progress     Patient Stated (pt-stated)        Would like to loose about 20 lbs.          This is a list of Health Maintenance Items that are overdue or due now: Recommendations  Colorectal cancer screening - due in October.    Orders/Referrals Placed Today: No orders of the defined types were placed in this encounter.  (Contact our referral department at [phone] if you have not spoken with someone about your referral appointment within the next 5 days)    Follow-up Plan Follow-up with Hali Marry, MD as  planned Schedule your colonoscopy in October, 2023. Medicare wellness in one year.  AVS printed and given to the patient.      Health Maintenance, Female Adopting a healthy lifestyle and getting preventive care are important in promoting health and wellness. Ask your health care provider about: The right schedule for you to have regular tests and exams. Things you can do on your own to prevent diseases and keep yourself healthy. What should I know about diet, weight, and exercise? Eat a healthy diet  Eat a diet that includes plenty of vegetables, fruits, low-fat dairy products, and lean protein. Do not eat a lot of foods that are high in solid fats, added sugars, or sodium. Maintain a healthy weight Body mass index (BMI) is used to identify weight problems. It estimates body fat based on height and weight. Your health care provider can help determine your BMI and help you achieve or maintain a healthy weight. Get regular exercise Get regular exercise. This is one of the most important things you can do for your health. Most adults should: Exercise for at least 150 minutes each week. The exercise should increase your heart rate and make you sweat (moderate-intensity exercise). Do strengthening exercises at least twice a week. This is in addition to the moderate-intensity exercise. Spend less time sitting. Even light physical activity can be beneficial. Watch cholesterol and blood lipids Have your blood tested for lipids and cholesterol at 76 years of age, then have this test every 5 years. Have your cholesterol levels checked more often  if: Your lipid or cholesterol levels are high. You are older than 76 years of age. You are at high risk for heart disease. What should I know about cancer screening? Depending on your health history and family history, you may need to have cancer screening at various ages. This may include screening for: Breast cancer. Cervical cancer. Colorectal  cancer. Skin cancer. Lung cancer. What should I know about heart disease, diabetes, and high blood pressure? Blood pressure and heart disease High blood pressure causes heart disease and increases the risk of stroke. This is more likely to develop in people who have high blood pressure readings or are overweight. Have your blood pressure checked: Every 3-5 years if you are 7-33 years of age. Every year if you are 58 years old or older. Diabetes Have regular diabetes screenings. This checks your fasting blood sugar level. Have the screening done: Once every three years after age 32 if you are at a normal weight and have a low risk for diabetes. More often and at a younger age if you are overweight or have a high risk for diabetes. What should I know about preventing infection? Hepatitis B If you have a higher risk for hepatitis B, you should be screened for this virus. Talk with your health care provider to find out if you are at risk for hepatitis B infection. Hepatitis C Testing is recommended for: Everyone born from [birth year] through 1965. Anyone with known risk factors for hepatitis C. Sexually transmitted infections (STIs) Get screened for STIs, including gonorrhea and chlamydia, if: You are sexually active and are younger than 76 years of age. You are older than 76 years of age and your health care provider tells you that you are at risk for this type of infection. Your sexual activity has changed since you were last screened, and you are at increased risk for chlamydia or gonorrhea. Ask your health care provider if you are at risk. Ask your health care provider about whether you are at high risk for HIV. Your health care provider may recommend a prescription medicine to help prevent HIV infection. If you choose to take medicine to prevent HIV, you should first get tested for HIV. You should then be tested every 3 months for as long as you are taking the medicine. Pregnancy If you are  about to stop having your period (premenopausal) and you may become pregnant, seek counseling before you get pregnant. Take 400 to 800 micrograms (mcg) of folic acid every day if you become pregnant. Ask for birth control (contraception) if you want to prevent pregnancy. Osteoporosis and menopause Osteoporosis is a disease in which the bones lose minerals and strength with aging. This can result in bone fractures. If you are 63 years old or older, or if you are at risk for osteoporosis and fractures, ask your health care provider if you should: Be screened for bone loss. Take a calcium or vitamin D supplement to lower your risk of fractures. Be given hormone replacement therapy (HRT) to treat symptoms of menopause. Follow these instructions at home: Alcohol use Do not drink alcohol if: Your health care provider tells you not to drink. You are pregnant, may be pregnant, or are planning to become pregnant. If you drink alcohol: Limit how much you have to: 0-1 drink a day. Know how much alcohol is in your drink. In the [place] one drink equals one 12 oz bottle of beer (355 mL), one 5 oz glass of wine (148 mL), or  one 1 oz glass of hard liquor (44 mL). Lifestyle Do not use any products that contain nicotine or tobacco. These products include cigarettes, chewing tobacco, and vaping devices, such as e-cigarettes. If you need help quitting, ask your health care provider. Do not use street drugs. Do not share needles. Ask your health care provider for help if you need support or information about quitting drugs. General instructions Schedule regular health, dental, and eye exams. Stay current with your vaccines. Tell your health care provider if: You often feel depressed. You have ever been abused or do not feel safe at home. Summary Adopting a healthy lifestyle and getting preventive care are important in promoting health and wellness. Follow your health care provider's instructions about  healthy diet, exercising, and getting tested or screened for diseases. Follow your health care provider's instructions on monitoring your cholesterol and blood pressure. This information is not intended to replace advice given to you by your health care provider. Make sure you discuss any questions you have with your health care provider. Document Revised: 07/04/2020 Document Reviewed: 07/04/2020 Elsevier Patient Education  [place].

## 2021-04-13 ENCOUNTER — Encounter: Payer: Self-pay | Admitting: Family Medicine

## 2021-04-13 ENCOUNTER — Ambulatory Visit (INDEPENDENT_AMBULATORY_CARE_PROVIDER_SITE_OTHER): Payer: Medicare Other | Admitting: Family Medicine

## 2021-04-13 ENCOUNTER — Other Ambulatory Visit: Payer: Self-pay

## 2021-04-13 VITALS — BP 148/66 | HR 82 | Resp 18 | Ht 69.0 in | Wt 217.0 lb

## 2021-04-13 DIAGNOSIS — I10 Essential (primary) hypertension: Secondary | ICD-10-CM

## 2021-04-13 DIAGNOSIS — R7301 Impaired fasting glucose: Secondary | ICD-10-CM | POA: Diagnosis not present

## 2021-04-13 DIAGNOSIS — E785 Hyperlipidemia, unspecified: Secondary | ICD-10-CM | POA: Diagnosis not present

## 2021-04-13 DIAGNOSIS — N1832 Chronic kidney disease, stage 3b: Secondary | ICD-10-CM

## 2021-04-13 LAB — POCT GLYCOSYLATED HEMOGLOBIN (HGB A1C): Hemoglobin A1C: 5.7 % — AB (ref 4.0–5.6)

## 2021-04-13 MED ORDER — VALSARTAN-HYDROCHLOROTHIAZIDE 320-25 MG PO TABS: 1.0000 | ORAL_TABLET | Freq: Every day | ORAL | 3 refills | Status: DC

## 2021-04-13 MED ORDER — ROSUVASTATIN CALCIUM 10 MG PO TABS: 10.0000 mg | ORAL_TABLET | Freq: Every day | ORAL | 3 refills | Status: DC

## 2021-04-13 NOTE — Assessment & Plan Note (Addendum)
Home Blood pressure machine is actually reading significantly higher than our machine here.  We are getting blood pressures consistently in the 140s so we discussed increasing her HCTZ component of her Diovan HCTZ.  She is already maxed out on the amlodipine we will try this for a month and see if pressures improve.    We can recheck a BMP at that time.

## 2021-04-13 NOTE — Patient Instructions (Addendum)
Appt with me in 4-3 months for BP and cholesterol

## 2021-04-13 NOTE — Progress Notes (Signed)
Established Patient Office Visit  Subjective:  Patient ID: [name], female    DOB: 03-Aug-1945  Age: 76 y.o. MRN: [id number]  CC:  Chief Complaint  Patient presents with   Follow-up    Impaired fasting glucose    Hypertension    Follow up     HPI [name] presents for   Hypertension- Pt denies chest pain, SOB, dizziness, or heart palpitations.  Taking meds as directed w/o problems.  Denies medication side effects.    Impaired fasting glucose-no increased thirst or urination. No symptoms consistent with hypoglycemia.  F/U CKD - no recent changes.   She did want to go over her recent cholesterol results.  We had recommended that she start a statin but she wanted to discuss it in person.  Cardiovascular risk score of 19.5%.  Past Medical History:  Diagnosis Date   Diverticulosis    Hiatal hernia    Hyperlipidemia    borderline   Hypertension    Spinal stenosis 2019    Past Surgical History:  Procedure Laterality Date   ABDOMINAL HYSTERECTOMY     TAH Pt. unsure if BSO   BREAST REDUCTION SURGERY  1994   BREAST SURGERY     Reduction   LIPOSUCTION     REDUCTION MAMMAPLASTY Bilateral 1994    Family History  Problem Relation Age of Onset   Breast cancer Sister 61       COD breast cancer and pneumonia   Hypertension Sister    Heart attack Father    Hypertension Father    Hypertension Mother    Kidney disease Mother    Breast cancer Other        ages 39 and 48--2 nieces   Hypertension Brother    Colon cancer Neg Hx    Esophageal cancer Neg Hx    Rectal cancer Neg Hx    Stomach cancer Neg Hx     Social History   Socioeconomic History   Marital status: Married    Spouse name: Broadus John   Number of children: 0   Years of education: 13   Highest education level: 12th grade  Occupational History   Occupation: retired    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 She enjoys to volunteering and doing church activities.   Social Determinants of Health   Financial Resource Strain: Low Risk    Difficulty of Paying Living Expenses: Not hard at all  Food Insecurity: No Food Insecurity   Worried About Charity fundraiser in the Last Year: Never true   [place] in the Last Year: Never true  Transportation Needs: No Transportation Needs   Lack of Transportation (Medical): No   Lack of Transportation (Non-Medical): No  Physical Activity: Inactive   Days of Exercise per Week: 0 days   Minutes of Exercise per Session: 0 min  Stress: No Stress Concern Present   Feeling of Stress : Not at all  Social Connections: Socially Integrated   Frequency of Communication  with Friends and Family: More than three times a week   Frequency of Social Gatherings with Friends and Family: Twice a week   Attends Religious Services: More than 4 times per year   Active Member of [employer] or Organizations: Yes   Attends Music therapist: More than 4 times per year   Marital Status: Married  Human resources officer Violence: Not At Risk   Fear of Current or Ex-Partner: No   Emotionally Abused: No   Physically Abused: No   Sexually Abused: No    Outpatient Medications Prior to Visit  Medication Sig Dispense Refill   amLODipine (NORVASC) 10 MG tablet Take 1 tablet (10 mg total) by mouth daily. 90 tablet 1   Calcium Carbonate-Vitamin D 600-200 MG-UNIT TABS Take by mouth.     colchicine 0.6 MG tablet Take 1 tablet (0.6 mg total) by mouth daily as needed. Can take up to 1 tablet twice a day during acute gout flares 30 tablet 2   Ferrous Gluconate (IRON  27 PO) Take 27 mg by mouth daily.     magnesium oxide (MAG-OX) 400 MG tablet Take 400 mg by mouth daily.     Multiple Vitamins-Minerals (CENTRUM SILVER ADULT 50+) TABS Take 1 tablet by mouth daily.     Multiple Vitamins-Minerals (MULTIVITAMIN PO) Take by mouth.     Omega-3 Fatty Acids (FISH OIL) 1200 MG CAPS Take by mouth.     Psyllium (METAMUCIL PO) Take by mouth.     TURMERIC CURCUMIN PO Take 1,000 mg by mouth daily.     vitamin C (ASCORBIC ACID) 500 MG tablet Take 500 mg by mouth daily.     VITAMIN D PO Take 1 tablet by mouth daily.     Vitamin E 90 MG (200 UNIT) CAPS Take 1 tablet by mouth daily.     valsartan-hydrochlorothiazide (DIOVAN-HCT) 320-12.5 MG tablet Take 1 tablet by mouth daily. 90 tablet 3   meloxicam (MOBIC) 15 MG tablet TAKE 1 TABLET (15 MG TOTAL) BY MOUTH DAILY. (Patient not taking: Reported on 04/13/2021) 30 tablet 0   No facility-administered medications prior to visit.    Allergies  Allergen Reactions   Penicillins Rash    REACTION: rash    ROS Review of Systems    Objective:    Physical Exam Constitutional:      Appearance: Normal appearance. She is well-developed.  HENT:     Head: Normocephalic and atraumatic.  Cardiovascular:     Rate and Rhythm: Normal rate and regular rhythm.     Heart sounds: Normal heart sounds.  Pulmonary:     Effort: Pulmonary effort is normal.     Breath sounds: Normal breath sounds.  Skin:    General: Skin is warm and dry.  Neurological:     Mental Status: She is alert and oriented to person, place, and time.  Psychiatric:        Behavior: Behavior normal.   BP (!) 148/66    Pulse 82    Resp 18    Ht 5' 9"  (1.753 m)    Wt 217 lb (98.4 kg)    SpO2 98%    BMI 32.05 kg/m  Wt Readings from Last 3 Encounters:  04/13/21 217 lb (98.4 kg)  04/05/21 215 lb (97.5 kg)  03/16/21 215 lb (97.5 kg)     There are no preventive care reminders to display for this patient.  There are no preventive care reminders to display for  this patient.  Lab Results  Component Value Date   TSH 1.17 09/23/2018   Lab Results  Component Value Date   WBC 5.2 01/10/2021   HGB 12.7 01/10/2021   HCT 39.9 01/10/2021   MCV 86.0 01/10/2021   PLT 280 01/10/2021   Lab Results  Component Value Date   NA 138 01/10/2021   K 4.3 01/10/2021   CO2 32 01/10/2021   GLUCOSE 115 (H) 01/10/2021   BUN 16 01/10/2021   CREATININE 0.94 01/10/2021   BILITOT 0.7 01/10/2021   ALKPHOS 94 10/30/2016   AST 20 01/10/2021   ALT 16 01/10/2021   PROT 7.3 01/10/2021   ALBUMIN 4.4 10/30/2016   CALCIUM 9.7 01/10/2021   EGFR 63 01/10/2021   Lab Results  Component Value Date   CHOL 213 (H) 03/16/2021   Lab Results  Component Value Date   HDL 47 (L) 03/16/2021   Lab Results  Component Value Date   LDLCALC 135 (H) 03/16/2021   Lab Results  Component Value Date   TRIG 179 (H) 03/16/2021   Lab Results  Component Value Date   CHOLHDL 4.5 03/16/2021   Lab Results  Component Value Date   HGBA1C 5.8 (A) 01/05/2021      Assessment & Plan:   Problem List Items Addressed This Visit       Cardiovascular and Mediastinum   HYPERTENSION, BENIGN - Primary    Home Blood pressure machine is actually reading significantly higher than our machine here.  We are getting blood pressures consistently in the 140s so we discussed increasing her HCTZ component of her Diovan HCTZ.  She is already maxed out on the amlodipine we will try this for a month and see if pressures improve.    We can recheck a BMP at that time.       Relevant Medications   rosuvastatin (CRESTOR) 10 MG tablet   valsartan-hydrochlorothiazide (DIOVAN-HCT) 320-25 MG tablet     Endocrine   IFG (impaired fasting glucose)    Well controlled. Continue current regimen. Follow up in  6 mo       Relevant Orders   POCT HgB A1C     Genitourinary   Chronic kidney disease (CKD) stage G3b/A1, moderately decreased glomerular filtration rate (GFR) between 30-44 mL/min/1.73 square  meter and albuminuria creatinine ratio less than 30 mg/g (HCC)    Continue to follow renal function every 6 months.  We are can bump up her diuretic a little bit so we will need to keep an eye on her BUN and creatinine.        Other   Hyperlipidemia   Relevant Medications   rosuvastatin (CRESTOR) 10 MG tablet   valsartan-hydrochlorothiazide (DIOVAN-HCT) 320-25 MG tablet    Meds ordered this encounter  Medications   rosuvastatin (CRESTOR) 10 MG tablet    Sig: Take 1 tablet (10 mg total) by mouth at bedtime.    Dispense:  90 tablet    Refill:  3   valsartan-hydrochlorothiazide (DIOVAN-HCT) 320-25 MG tablet    Sig: Take 1 tablet by mouth daily.    Dispense:  90 tablet    Refill:  3    Follow-up: Return in about 4 weeks (around 05/11/2021) for Nurse visit for BP check after start new medication .    Beatrice Lecher, MD

## 2021-04-13 NOTE — Assessment & Plan Note (Signed)
Continue to follow renal function every 6 months.  We are can bump up her diuretic a little bit so we will need to keep an eye on her BUN and creatinine.

## 2021-04-13 NOTE — Assessment & Plan Note (Signed)
Well controlled. Continue current regimen. Follow up in  6 mo  

## 2021-04-21 ENCOUNTER — Ambulatory Visit (INDEPENDENT_AMBULATORY_CARE_PROVIDER_SITE_OTHER): Payer: Medicare Other | Admitting: Sports Medicine

## 2021-04-21 ENCOUNTER — Ambulatory Visit (INDEPENDENT_AMBULATORY_CARE_PROVIDER_SITE_OTHER): Payer: Medicare Other

## 2021-04-21 ENCOUNTER — Other Ambulatory Visit: Payer: Self-pay

## 2021-04-21 DIAGNOSIS — M109 Gout, unspecified: Secondary | ICD-10-CM | POA: Insufficient documentation

## 2021-04-21 DIAGNOSIS — M25475 Effusion, left foot: Secondary | ICD-10-CM

## 2021-04-21 NOTE — Assessment & Plan Note (Signed)
This is a very pleasant 76 year old female, 5 days ago she noted swelling of her left first MTP, moderately painful, she feels as though the swelling started slowly, no redness, no warmth. No trauma, she did not eat anything out of the ordinary such as a large meal of seafood or red meat. She saw Caleen Jobs, DNP, uric acid levels were obtained, in the eights. X-rays were also appropriately obtained that showed midfoot and first MTP osteoarthritis. I am really not sure at this juncture if this was a flare of podagra or if this is just osteoarthritis, she still has difficulty wearing closed toed shoes so we injected her first MTP today. We will keep an eye on this, if she has another flare and it reaches crescendo very quickly i.e. over a day then we will consider prophylacting her with allopurinol. If not then we will likely do another injection +/- adding a Morton's plate.

## 2021-04-21 NOTE — Progress Notes (Signed)
° ° °  Procedures performed today:    Procedure: Real-time Ultrasound Guided injection of the left first MTP Device: Samsung HS60  Verbal informed consent obtained.  Time-out conducted.  Noted no overlying erythema, induration, or other signs of local infection.  Skin prepped in a sterile fashion.  Local anesthesia: Topical Ethyl chloride.  With sterile technique and under real time ultrasound guidance: Noted arthritic joint and moderate synovitis, 1/2 cc lidocaine, 1/2 cc kenalog 40 injected easily. Completed without difficulty  Advised to call if fevers/chills, erythema, induration, drainage, or persistent bleeding.  Images permanently stored and available for review in PACS.  Impression: Technically successful ultrasound guided injection.  Independent interpretation of notes and tests performed by another provider:   None.  Brief History, Exam, Impression, and Recommendations:    Swelling of first metatarsophalangeal (MTP) joint of left foot This is a very pleasant 76 year old female, 5 days ago she noted swelling of her left first MTP, moderately painful, she feels as though the swelling started slowly, no redness, no warmth. No trauma, she did not eat anything out of the ordinary such as a large meal of seafood or red meat. She saw Hyman Hopes, DNP, uric acid levels were obtained, in the eights. X-rays were also appropriately obtained that showed midfoot and first MTP osteoarthritis. I am really not sure at this juncture if this was a flare of podagra or if this is just osteoarthritis, she still has difficulty wearing closed toed shoes so we injected her first MTP today. We will keep an eye on this, if she has another flare and it reaches crescendo very quickly i.e. over a day then we will consider prophylacting her with allopurinol. If not then we will likely do another injection +/- adding a Morton's plate.    ___________________________________________ Ihor Austin. Benjamin Stain,  M.D., ABFM., CAQSM. Primary Care and Sports Medicine [facility]  Adjunct Instructor of Family Medicine  [employer]

## 2021-04-25 ENCOUNTER — Other Ambulatory Visit: Payer: Self-pay | Admitting: Family Medicine

## 2021-04-25 DIAGNOSIS — M79675 Pain in left toe(s): Secondary | ICD-10-CM

## 2021-05-11 ENCOUNTER — Ambulatory Visit (INDEPENDENT_AMBULATORY_CARE_PROVIDER_SITE_OTHER): Payer: Medicare Other | Admitting: Family Medicine

## 2021-05-11 ENCOUNTER — Other Ambulatory Visit: Payer: Self-pay

## 2021-05-11 VITALS — BP 133/59 | HR 71

## 2021-05-11 DIAGNOSIS — I10 Essential (primary) hypertension: Secondary | ICD-10-CM | POA: Diagnosis not present

## 2021-05-11 NOTE — Progress Notes (Signed)
Patient comes in today for blood pressure check.  ? ?[name] is taking Amlodipine 10 mg and Valsartan-HCTZ 320-12.5 mg for blood pressure control. She was told she could not pick up increased dosage of medication until April. She has not increased medication to Valsartan-HCTZ 320-25 mg.  ? ?She denies any missed doses, side effects, headaches, chest pain, palpitations, dizziness, or shortness of breath.  ? ?[name] has been checking blood pressure readings at home. She brought her blood pressure cuff to her last visit and it was reading 20 points over ours. Her home readings have been 140's/70's.  ? ?Her first blood pressure reading today is: 146/65 in left arm. ?Her second reading is: 133/59 in right arm.  ? ?I advised patient I would get advise from Dr. Linford Arnold to see how she would like to proceed, if she wants her to come back in 4 weeks after she is able to get the new dosage of blood pressure medication or if she would like to do something else.  ? ?

## 2021-05-11 NOTE — Progress Notes (Signed)
Please call the pharmacy and see if we can request that they go ahead and release the new prescription.  If its a different strength I do not know why they are not letting her have it its not a scheduled drug.  This is not typical.  But yes I would prefer that she be on the new medication for about 3 weeks and then come back in for repeat blood pressure check. ?

## 2021-05-11 NOTE — Progress Notes (Signed)
Patient called back and was made aware. She will start new medication and make nurse visit for blood pressure check in 3-4 weeks.  ?

## 2021-05-11 NOTE — Progress Notes (Signed)
Called and spoke with the pharmacy that said there must be some miscommunication and they will get her new dosage ready. Tried to call patient with no answer. LVM for patient to call me back to discuss.  ?

## 2021-05-19 ENCOUNTER — Ambulatory Visit (INDEPENDENT_AMBULATORY_CARE_PROVIDER_SITE_OTHER): Payer: Medicare Other | Admitting: Sports Medicine

## 2021-05-19 ENCOUNTER — Other Ambulatory Visit: Payer: Self-pay

## 2021-05-19 DIAGNOSIS — M25475 Effusion, left foot: Secondary | ICD-10-CM | POA: Diagnosis not present

## 2021-05-19 NOTE — Progress Notes (Signed)
? ? ?  Procedures performed today:   ? ?None. ? ?Independent interpretation of notes and tests performed by another provider:  ? ?None. ? ?Brief History, Exam, Impression, and Recommendations:   ? ?Swelling of first metatarsophalangeal (MTP) joint of left foot ?[name] returns, she is a pleasant 76 year old female, we initially saw her with pain and swelling left first MTP, seen by Hyman Hopes, DNP, uric acid levels were obtained and were in the eights. 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 ?We were not completely sure whether this was a flare of podagra or just osteoarthritis, we injected her first MTP and she returns today completely pain-free at the MTP, she has occasional discomfort across the midfoot, I think this is from her osteoarthritis. ?She can come back to see me as needed, if she has another rapid onset flare with crescendo very quickly we would consider prophylaxing her with allopurinol, otherwise we will likely just do another injection and treat this is osteoarthritis, return to see me as needed. ? ? ? ?___________________________________________ ?[name]. [name], M.D., ABFM., CAQSM. ?Primary Care and Sports Medicine ?[name] ? ?Adjunct Instructor of Family Medicine  ?[employer] ?

## 2021-05-19 NOTE — Assessment & Plan Note (Signed)
Dyan returns, she is a pleasant 76 year old female, we initially saw her with pain and swelling left first MTP, seen by Hyman Hopes, DNP, uric acid levels were obtained and were in the eights. 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 ?We were not completely sure whether this was a flare of podagra or just osteoarthritis, we injected her first MTP and she returns today completely pain-free at the MTP, she has occasional discomfort across the midfoot, I think this is from her osteoarthritis. ?She can come back to see me as needed, if she has another rapid onset flare with crescendo very quickly we would consider prophylaxing her with allopurinol, otherwise we will likely just do another injection and treat this is osteoarthritis, return to see me as needed. ?

## 2021-06-01 ENCOUNTER — Ambulatory Visit (INDEPENDENT_AMBULATORY_CARE_PROVIDER_SITE_OTHER): Payer: Medicare Other | Admitting: Physician Assistant

## 2021-06-01 DIAGNOSIS — I10 Essential (primary) hypertension: Secondary | ICD-10-CM

## 2021-06-01 NOTE — Progress Notes (Signed)
Pt presents to clinic today for BP check. She is taking her medications as directed with no problems.  ? ?Denies any cp/sob/palpitaions/headaches/dizziness or swelling.   ? ?She did ask about whether or not Crestor could cause hair breakage. I informed her that this is not normally a side effect of statins. However, I would send her concern to Dr. Linford Arnold for advice. I recommended that she try taking Biotin for this. ? ?She has a f/u appt with pcp 07/18/2021 for bp and cholesterol.  ?

## 2021-06-01 NOTE — Progress Notes (Signed)
Ok BP improved. Keep meds the same and keep follow up with PCP.  ?

## 2021-07-18 ENCOUNTER — Encounter: Payer: Self-pay | Admitting: Family Medicine

## 2021-07-18 ENCOUNTER — Ambulatory Visit (INDEPENDENT_AMBULATORY_CARE_PROVIDER_SITE_OTHER): Payer: Medicare Other | Admitting: Family Medicine

## 2021-07-18 VITALS — BP 136/49 | HR 70 | Ht 69.0 in | Wt 218.0 lb

## 2021-07-18 DIAGNOSIS — I10 Essential (primary) hypertension: Secondary | ICD-10-CM

## 2021-07-18 DIAGNOSIS — R7301 Impaired fasting glucose: Secondary | ICD-10-CM | POA: Diagnosis not present

## 2021-07-18 DIAGNOSIS — E785 Hyperlipidemia, unspecified: Secondary | ICD-10-CM

## 2021-07-18 DIAGNOSIS — N1832 Chronic kidney disease, stage 3b: Secondary | ICD-10-CM | POA: Diagnosis not present

## 2021-07-18 DIAGNOSIS — L659 Nonscarring hair loss, unspecified: Secondary | ICD-10-CM

## 2021-07-18 LAB — POCT GLYCOSYLATED HEMOGLOBIN (HGB A1C): Hemoglobin A1C: 5.8 % — AB (ref 4.0–5.6)

## 2021-07-18 NOTE — Assessment & Plan Note (Signed)
Continue to follow renal function every 6 months. 

## 2021-07-18 NOTE — Assessment & Plan Note (Signed)
A1c went up just slightly today at 5.8.  She is otherwise doing well.  Plan to recheck again in 6 months.

## 2021-07-18 NOTE — Assessment & Plan Note (Signed)
To recheck lipids and liver enzymes now that she is on a statin and tolerating well thus far.

## 2021-07-18 NOTE — Patient Instructions (Signed)
For her care great stimulation recommend topical minoxidil.  This is a product that is found in women's Rogaine.  It may be in other products as well.

## 2021-07-18 NOTE — Progress Notes (Signed)
Established Patient Office Visit  Subjective   Patient ID: [name], female    DOB: December 16, 1945  Age: 76 y.o. MRN: [id number]  No chief complaint on file.   HPI   Hypertension- Pt denies chest pain, SOB, dizziness, or heart palpitations.  Taking meds as directed w/o problems.  Denies medication side effects.    Impaired fasting glucose-no increased thirst or urination. No symptoms consistent with hypoglycemia.  F/U CKD 3 -no recent changes.  Has had 2 deaths recently.  Her cousin died at 85 from metastatic breast cancer.  She was treated and thought that they she was in remission in the fall and then unfortunately the cancer came back and spread and she recently passed away.  She just found out this morning on her way here but another cousin who is her exact same age just passed away as well.  He had just gotten out of the hospital he is a Education officer, environmental at [employer]  Hyperlipidemia - tolerating stating well with no myalgias or significant side effects.  She feels the Crestor is causing hair loss. She did start some biotin.    O/W doing well with the new start statin.   Lab Results  Component Value Date   CHOL 213 (H) 03/16/2021   HDL 47 (L) 03/16/2021   LDLCALC 135 (H) 03/16/2021   TRIG 179 (H) 03/16/2021   CHOLHDL 4.5 03/16/2021        ROS    Objective:     BP (!) 136/49   Pulse 70   Ht 5\' 9"  (1.753 m)   Wt 218 lb (98.9 kg)   BMI 32.19 kg/m    Physical Exam Vitals and nursing note reviewed.  Constitutional:      Appearance: She is well-developed.  HENT:     Head: Normocephalic and atraumatic.  Cardiovascular:     Rate and Rhythm: Normal rate and regular rhythm.     Heart sounds: Normal heart sounds.  Pulmonary:     Effort: Pulmonary effort is normal.     Breath sounds: Normal breath sounds.  Skin:    General: Skin is warm and dry.  Neurological:     Mental Status: She is alert and oriented to person, place, and time.  Psychiatric:         Behavior: Behavior normal.    Results for orders placed or performed in visit on 07/18/21  POCT glycosylated hemoglobin (Hb A1C)  Result Value Ref Range   Hemoglobin A1C 5.8 (A) 4.0 - 5.6 %   HbA1c POC (<> result, manual entry)     HbA1c, POC (prediabetic range)     HbA1c, POC (controlled diabetic range)        The 10-year ASCVD risk score (Arnett DK, et al., 2019) is: 17.2%    Assessment & Plan:   Problem List Items Addressed This Visit       Cardiovascular and Mediastinum   HYPERTENSION, BENIGN - Primary    Pressure is up a little bit today but she just again found out that her cousin passed away on the way here today.  We will keep an eye on it.  Due for BMP today       Relevant Orders   BASIC METABOLIC PANEL WITH GFR   Lipid Panel w/reflex Direct LDL   COMPLETE METABOLIC PANEL WITH GFR     Endocrine   IFG (impaired fasting glucose)    A1c went up just slightly today at 5.8.  She  is otherwise doing well.  Plan to recheck again in 6 months.       Relevant Orders   POCT glycosylated hemoglobin (Hb A1C) (Completed)     Genitourinary   Chronic kidney disease (CKD) stage G3b/A1, moderately decreased glomerular filtration rate (GFR) between 30-44 mL/min/1.73 square meter and albuminuria creatinine ratio less than 30 mg/g (HCC)    Continue to follow renal function every 6 months.         Other   Hyperlipidemia    To recheck lipids and liver enzymes now that she is on a statin and tolerating well thus far.       Relevant Orders   Lipid Panel w/reflex Direct LDL   COMPLETE METABOLIC PANEL WITH GFR   Hair loss   Hair loss -reassured her that it is not secondary to statin. Will start topical rogaine.   Return in about 6 months (around 01/18/2022) for Hypertension, Pre-diabetes.    Nani Gasser, MD

## 2021-07-18 NOTE — Assessment & Plan Note (Signed)
Pressure is up a little bit today but she just again found out that her cousin passed away on the way here today.  We will keep an eye on it.  Due for BMP today

## 2021-07-19 LAB — COMPLETE METABOLIC PANEL WITH GFR
AG Ratio: 1.8 (calc) (ref 1.0–2.5)
ALT: 20 U/L (ref 6–29)
AST: 25 U/L (ref 10–35)
Albumin: 5 g/dL (ref 3.6–5.1)
Alkaline phosphatase (APISO): 76 U/L (ref 37–153)
BUN/Creatinine Ratio: 23 (calc) — ABNORMAL HIGH (ref 6–22)
BUN: 24 mg/dL (ref 7–25)
CO2: 31 mmol/L (ref 20–32)
Calcium: 10.4 mg/dL (ref 8.6–10.4)
Chloride: 99 mmol/L (ref 98–110)
Creat: 1.06 mg/dL — ABNORMAL HIGH (ref 0.60–1.00)
Globulin: 2.8 g/dL (calc) (ref 1.9–3.7)
Glucose, Bld: 111 mg/dL — ABNORMAL HIGH (ref 65–99)
Potassium: 4.1 mmol/L (ref 3.5–5.3)
Sodium: 140 mmol/L (ref 135–146)
Total Bilirubin: 0.7 mg/dL (ref 0.2–1.2)
Total Protein: 7.8 g/dL (ref 6.1–8.1)
eGFR: 54 mL/min/{1.73_m2} — ABNORMAL LOW (ref >=60)

## 2021-07-19 LAB — LIPID PANEL W/REFLEX DIRECT LDL
Cholesterol: 139 mg/dL (ref <200)
HDL: 52 mg/dL (ref >=50)
LDL Cholesterol (Calc): 64 mg/dL (calc)
Non-HDL Cholesterol (Calc): 87 mg/dL (calc) (ref <130)
Total CHOL/HDL Ratio: 2.7 (calc) (ref <5.0)
Triglycerides: 156 mg/dL — ABNORMAL HIGH (ref <150)

## 2021-07-19 NOTE — Progress Notes (Signed)
Hi [name], cholesterol looks absolutely phenomenal on the new regimen.  We will continue with that.  Liver enzymes are normal and kidney function is stable.

## 2021-09-09 ENCOUNTER — Other Ambulatory Visit: Payer: Self-pay | Admitting: Family Medicine

## 2021-09-09 DIAGNOSIS — I10 Essential (primary) hypertension: Secondary | ICD-10-CM

## 2021-12-18 ENCOUNTER — Other Ambulatory Visit: Payer: Self-pay | Admitting: Family Medicine

## 2021-12-18 DIAGNOSIS — Z1231 Encounter for screening mammogram for malignant neoplasm of breast: Secondary | ICD-10-CM

## 2022-01-17 ENCOUNTER — Encounter: Payer: Self-pay | Admitting: Family Medicine

## 2022-01-17 ENCOUNTER — Ambulatory Visit (INDEPENDENT_AMBULATORY_CARE_PROVIDER_SITE_OTHER): Payer: Medicare Other | Admitting: Family Medicine

## 2022-01-17 VITALS — BP 136/52 | HR 72 | Ht 69.0 in | Wt 216.0 lb

## 2022-01-17 DIAGNOSIS — M48062 Spinal stenosis, lumbar region with neurogenic claudication: Secondary | ICD-10-CM | POA: Diagnosis not present

## 2022-01-17 DIAGNOSIS — N1832 Chronic kidney disease, stage 3b: Secondary | ICD-10-CM

## 2022-01-17 DIAGNOSIS — R1032 Left lower quadrant pain: Secondary | ICD-10-CM | POA: Diagnosis not present

## 2022-01-17 DIAGNOSIS — Z23 Encounter for immunization: Secondary | ICD-10-CM | POA: Diagnosis not present

## 2022-01-17 DIAGNOSIS — L659 Nonscarring hair loss, unspecified: Secondary | ICD-10-CM

## 2022-01-17 DIAGNOSIS — I10 Essential (primary) hypertension: Secondary | ICD-10-CM | POA: Diagnosis not present

## 2022-01-17 DIAGNOSIS — R7301 Impaired fasting glucose: Secondary | ICD-10-CM

## 2022-01-17 LAB — POCT GLYCOSYLATED HEMOGLOBIN (HGB A1C): Hemoglobin A1C: 6.6 % — AB (ref 4.0–5.6)

## 2022-01-17 MED ORDER — SERTRALINE HCL 50 MG PO TABS: ORAL_TABLET | ORAL | 2 refills | Status: DC

## 2022-01-17 NOTE — Assessment & Plan Note (Signed)
1C jumped up significantly compared to prior.  She does not really feel like she has had any major changes in her diet.  We discussed cutting back on sugars which she is already done recently and also cutting back on carbohydrate intake.  She is also not been moving nearly as much because of her worsening spinal stenosis.  So discussed trying to get moving more often.

## 2022-01-17 NOTE — Progress Notes (Signed)
Established Patient Office Visit  Subjective   Patient ID: [name], female    DOB: 01/02/1946  Age: 76 y.o. MRN: [id number]  Chief Complaint  Patient presents with   Hypertension   ifg    HPI  Hypertension- Pt denies chest pain, SOB, dizziness, or heart palpitations.  Taking meds as directed w/o problems.  Denies medication side effects.    Impaired fasting glucose-no increased thirst or urination. No symptoms consistent with hypoglycemia.  Spinal stenosis-she does not get getting a little bit worse she just has not been able to go into and move quite as much.  But she is not at the point that she wants to have surgery or do anything different.  Says her hair started growing back in on its own and so does not have any concerns with that anymore.  She says for the last months she has had some left-sided lower quadrant pain that is been bothering her maybe 3 to 4 days/week she will wake up with some discomfort.  She says her bowels are moving normally she has been taking Metamucil pretty regularly.  She is planning on having a colonoscopy next month.    ROS    Objective:     BP (!) 141/54   Pulse 82   Ht 5\' 9"  (1.753 m)   Wt 216 lb (98 kg)   SpO2 100%   BMI 31.90 kg/m    Physical Exam Vitals and nursing note reviewed.  Constitutional:      Appearance: She is well-developed.  HENT:     Head: Normocephalic and atraumatic.  Cardiovascular:     Rate and Rhythm: Normal rate and regular rhythm.     Heart sounds: Normal heart sounds.  Pulmonary:     Effort: Pulmonary effort is normal.     Breath sounds: Normal breath sounds.  Abdominal:     General: Bowel sounds are normal.     Palpations: Abdomen is soft. There is no mass.     Tenderness: There is no abdominal tenderness. There is no guarding.     Comments: + Left lower quad tenderness  Skin:    General: Skin is warm and dry.  Neurological:     Mental Status: She is alert and oriented to person, place,  and time.  Psychiatric:        Behavior: Behavior normal.      Results for orders placed or performed in visit on 01/17/22  POCT glycosylated hemoglobin (Hb A1C)  Result Value Ref Range   Hemoglobin A1C 6.6 (A) 4.0 - 5.6 %   HbA1c POC (<> result, manual entry)     HbA1c, POC (prediabetic range)     HbA1c, POC (controlled diabetic range)        The 10-year ASCVD risk score (Arnett DK, et al., 2019) is: 13%    Assessment & Plan:   Problem List Items Addressed This Visit       Cardiovascular and Mediastinum   HYPERTENSION, BENIGN - Primary    Discussed the importance of getting that blood pressure consistently under 140.  We will recheck again today and make any adjustments needed.  Also with her renal function it is really important that we maximally control her blood pressure.        Endocrine   IFG (impaired fasting glucose)    1C jumped up significantly compared to prior.  She does not really feel like she has had any major changes in her diet.  We  discussed cutting back on sugars which she is already done recently and also cutting back on carbohydrate intake.  She is also not been moving nearly as much because of her worsening spinal stenosis.  So discussed trying to get moving more often.      Relevant Orders   POCT glycosylated hemoglobin (Hb A1C) (Completed)     Genitourinary   Chronic kidney disease (CKD) stage G3b/A1, moderately decreased glomerular filtration rate (GFR) between 30-44 mL/min/1.73 square meter and albuminuria creatinine ratio less than 30 mg/g (HCC)    Discussed ways to avoid any worsening of kidney function plan to recheck levels today.        Other   Spinal stenosis    As for like her symptoms are progressing but is not at the point of wanting a referral yet.      Hair loss   Other Visit Diagnoses     Need for immunization against influenza       Relevant Orders   Flu Vaccine QUAD High Dose(Fluad) (Completed)   LLQ pain           Left lower quadrant pain-nontender on exam today which is reassuring.  But it has been persistent for a month.  She is actually scheduled for colonoscopy next month.  After scope, then discussed the possibility of getting a pelvic or lower abdominal ultrasound for further work-up.  Return in about 3 months (around 04/19/2022) for Pre-diabetes.    Nani Gasser, MD

## 2022-01-17 NOTE — Assessment & Plan Note (Signed)
As for like her symptoms are progressing but is not at the point of wanting a referral yet.

## 2022-01-17 NOTE — Addendum Note (Signed)
Addended by: Nani Gasser D on: 01/17/2022 10:29 AM   Modules accepted: Orders

## 2022-01-17 NOTE — Assessment & Plan Note (Signed)
Discussed the importance of getting that blood pressure consistently under 140.  We will recheck again today and make any adjustments needed.  Also with her renal function it is really important that we maximally control her blood pressure.

## 2022-01-17 NOTE — Assessment & Plan Note (Addendum)
Discussed ways to avoid any worsening of kidney function plan to recheck levels today.

## 2022-01-25 ENCOUNTER — Encounter: Payer: Self-pay | Admitting: Family Medicine

## 2022-01-25 ENCOUNTER — Telehealth (INDEPENDENT_AMBULATORY_CARE_PROVIDER_SITE_OTHER): Payer: Medicare Other | Admitting: Family Medicine

## 2022-01-25 VITALS — Ht 69.0 in | Wt 216.0 lb

## 2022-01-25 DIAGNOSIS — J069 Acute upper respiratory infection, unspecified: Secondary | ICD-10-CM | POA: Diagnosis not present

## 2022-01-25 MED ORDER — BENZONATATE 200 MG PO CAPS: 200.0000 mg | ORAL_CAPSULE | Freq: Two times a day (BID) | ORAL | 0 refills | Status: DC | PRN

## 2022-01-25 NOTE — Progress Notes (Signed)
[name] - 76 y.o. female MRN [id number]  Date of birth: 1946/02/06   This visit type was conducted due to national recommendations for restrictions regarding the COVID-19 Pandemic (e.g. social distancing).  This format is felt to be most appropriate for this patient at this time.  All issues noted in this document were discussed and addressed.  No physical exam was performed (except for noted visual exam findings with Video Visits).  I discussed the limitations of evaluation and management by telemedicine and the availability of in person appointments. The patient expressed understanding and agreed to proceed.  I connected withNAME@ on 01/25/22 at  3:10 PM EST by a video enabled telemedicine application and verified that I am speaking with the correct person using two identifiers.  Present at visit: Everrett Coombe, DO Durene Romans Donatelli   Patient Location: Home [address] [place] [address]   Provider location:   [facility]  Chief Complaint  Patient presents with   Cough    HPI  [name] is a 76 y.o. female who presents via audio/video conferencing for a telehealth visit today.  She has complaint of cough.  Symptoms started about 6 days ago.  Cough is productive of clear mucus.  She has not had any fever or chills.  She denies any shortness of breath or wheezing with this.  She does not have any sinus pain or pressure.  She has tried Mucinex, cough drops and cough syrup.  She has not had much improvement with this.  She has tried cough drops, Mucinex and cough syrup.  She has not had much improvement with this.   ROS:  A comprehensive ROS was completed and negative except as noted per HPI  Past Medical History:  Diagnosis Date   Diverticulosis    Hiatal hernia    Hyperlipidemia    borderline   Hypertension    Spinal stenosis 2019    Past Surgical History:  Procedure Laterality Date   ABDOMINAL HYSTERECTOMY     TAH Pt. unsure if BSO   BREAST  REDUCTION SURGERY  1994   BREAST SURGERY     Reduction   LIPOSUCTION     REDUCTION MAMMAPLASTY Bilateral 1994    Family History  Problem Relation Age of Onset   Breast cancer Sister 46       COD breast cancer and pneumonia   Hypertension Sister    Heart attack Father    Hypertension Father    Hypertension Mother    Kidney disease Mother    Breast cancer Other        ages 66 and 51--2 nieces   Hypertension Brother    Colon cancer Neg Hx    Esophageal cancer Neg Hx    Rectal cancer Neg Hx    Stomach cancer Neg Hx     Social History   Socioeconomic History   Marital status: Married    Spouse name: Jomarie Longs   Number of children: 0   Years of education: 13   Highest education level: 12th grade  Occupational History   Occupation: retired    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 She enjoys to volunteering and doing church activities.   Social Determinants of Health   Financial Resource Strain: Low Risk  (04/05/2021)   Overall Financial Resource Strain (CARDIA)    Difficulty of Paying Living Expenses: Not hard at all  Food Insecurity: No Food Insecurity (04/05/2021)   Hunger Vital Sign    Worried About Running Out of Food in the Last Year: Never true    Ran Out of Food in the Last Year: Never true  Transportation Needs: No Transportation Needs (04/05/2021)   PRAPARE - Administrator, Civil Service (Medical): No    Lack of Transportation (Non-Medical): No  Physical Activity: Inactive (04/05/2021)   Exercise Vital Sign    Days of Exercise per Week: 0 days     Minutes of Exercise per Session: 0 min  Stress: No Stress Concern Present (04/05/2021)   [employer] of Occupational Health - Occupational Stress Questionnaire    Feeling of Stress : Not at all  Social Connections: Socially Integrated (04/05/2021)   Social Connection and Isolation Panel [NHANES]    Frequency of Communication with Friends and Family: More than three times a week    Frequency of Social Gatherings with Friends and Family: Twice a week    Attends Religious Services: More than 4 times per year    Active Member of [employer] or Organizations: Yes    Attends Engineer, structural: More than 4 times per year    Marital Status: Married  Catering manager Violence: Not At Risk (04/05/2021)   Humiliation, Afraid, Rape, and Kick questionnaire    Fear of Current or Ex-Partner: No    Emotionally Abused: No    Physically Abused: No    Sexually Abused: No     Current Outpatient Medications:    amLODipine (NORVASC) 10 MG tablet, TAKE 1 TABLET BY MOUTH EVERY DAY, Disp: 90 tablet, Rfl: 1   benzonatate (TESSALON) 200 MG capsule, Take 1 capsule (200 mg total) by mouth 2 (two) times daily as needed for cough., Disp: 20 capsule, Rfl: 0   Calcium Carbonate-Vitamin D 600-200 MG-UNIT TABS, Take by mouth., Disp: , Rfl:    colchicine 0.6 MG tablet, Take 1 tablet (0.6 mg total) by mouth daily as needed. Can take up to 1 tablet twice a day during acute gout flares, Disp: 30 tablet, Rfl: 2   Ferrous Gluconate (IRON 27 PO), Take 27 mg by mouth daily., Disp: , Rfl:    magnesium oxide (MAG-OX) 400 MG tablet, Take 400 mg by mouth daily., Disp: , Rfl:    Multiple Vitamins-Minerals (CENTRUM SILVER ADULT 50+) TABS, Take 1 tablet by mouth daily., Disp: , Rfl:    Multiple Vitamins-Minerals (MULTIVITAMIN PO), Take by mouth., Disp: , Rfl:    Omega-3 Fatty Acids (FISH OIL) 1200 MG CAPS, Take by mouth., Disp: , Rfl:    Psyllium (METAMUCIL PO), Take by mouth., Disp: , Rfl:    rosuvastatin (CRESTOR) 10 MG  tablet, Take 1 tablet (10 mg total) by mouth at bedtime., Disp: 90 tablet, Rfl: 3   TURMERIC CURCUMIN PO, Take 1,000 mg by mouth daily., Disp: , Rfl:    valsartan-hydrochlorothiazide (DIOVAN-HCT) 320-25 MG tablet, Take 1 tablet by mouth daily., Disp: 90 tablet, Rfl: 3   vitamin C (ASCORBIC ACID) 500 MG tablet, Take 500 mg by mouth daily., Disp: , Rfl:    VITAMIN  D PO, Take 1 tablet by mouth daily., Disp: , Rfl:    Vitamin E 90 MG (200 UNIT) CAPS, Take 1 tablet by mouth daily., Disp: , Rfl:   EXAM:  VITALS per patient if applicable: Ht 5\' 9"  (1.753 m)   Wt 216 lb (98 kg)   BMI 31.90 kg/m   GENERAL: alert, oriented, appears well and in no acute distress  HEENT: atraumatic, conjunttiva clear, no obvious abnormalities on inspection of external nose and ears  NECK: normal movements of the head and neck  LUNGS: on inspection no signs of respiratory distress, breathing rate appears normal, no obvious gross SOB, gasping or wheezing  CV: no obvious cyanosis  MS: moves all visible extremities without noticeable abnormality  PSYCH/NEURO: pleasant and cooperative, no obvious depression or anxiety, speech and thought processing grossly intact  ASSESSMENT AND PLAN:  Discussed the following assessment and plan:  Viral URI with cough Recommend continued supportive care with increase fluids, humidifier and/or nasal saline.  She may use Tylenol as needed for throat pain.  Okay to continue Mucinex.  She can use Delsym over-the-counter for cough.  Adding Tessalon Perles as needed.  Instructed to contact clinic if having new or worsening symptoms.  Recommend in person visit if not improving.     I discussed the assessment and treatment plan with the patient. The patient was provided an opportunity to ask questions and all were answered. The patient agreed with the plan and demonstrated an understanding of the instructions.   The patient was advised to call back or seek an in-person evaluation if  the symptoms worsen or if the condition fails to improve as anticipated.    , DO

## 2022-01-25 NOTE — Progress Notes (Signed)
Symptoms: Cough since Friday.  Flu shot: Thursday  Medication: Mucinex DM, Cough Drops, Cough syrup

## 2022-01-25 NOTE — Assessment & Plan Note (Signed)
Recommend continued supportive care with increase fluids, humidifier and/or nasal saline.  She may use Tylenol as needed for throat pain.  Okay to continue Mucinex.  She can use Delsym over-the-counter for cough.  Adding Tessalon Perles as needed.  Instructed to contact clinic if having new or worsening symptoms.  Recommend in person visit if not improving.

## 2022-01-29 ENCOUNTER — Ambulatory Visit: Payer: Medicare Other

## 2022-01-30 ENCOUNTER — Ambulatory Visit: Payer: Medicare Other | Admitting: Sports Medicine

## 2022-01-30 DIAGNOSIS — M109 Gout, unspecified: Secondary | ICD-10-CM | POA: Diagnosis not present

## 2022-01-30 DIAGNOSIS — I10 Essential (primary) hypertension: Secondary | ICD-10-CM | POA: Diagnosis not present

## 2022-01-30 DIAGNOSIS — M25475 Effusion, left foot: Secondary | ICD-10-CM | POA: Diagnosis not present

## 2022-01-30 DIAGNOSIS — N1832 Chronic kidney disease, stage 3b: Secondary | ICD-10-CM | POA: Diagnosis not present

## 2022-01-30 DIAGNOSIS — R7301 Impaired fasting glucose: Secondary | ICD-10-CM | POA: Diagnosis not present

## 2022-01-30 MED ORDER — ALLOPURINOL 300 MG PO TABS: 300.0000 mg | ORAL_TABLET | Freq: Every day | ORAL | 3 refills | Status: DC

## 2022-01-30 MED ORDER — PREDNISONE 50 MG PO TABS: ORAL_TABLET | ORAL | 0 refills | Status: DC

## 2022-01-30 NOTE — Assessment & Plan Note (Signed)
Pleasant 76 year old female, multiple episodes of first MTP swelling, she does have some first MTP arthritis and hallux valgus, more recently she had increasing swelling that reached a crescendo over a couple of days, on exam she does have some warmth, redness, tenderness. I think she is having a gout flare. 11 months ago her uric acid levels were 8.3 which is high. Adding 5 days of prednisone, allopurinol, and I would like to recheck her uric acid levels, I will see her back in about a month, we will recheck her uric acid levels again with a goal of less than 5.

## 2022-01-30 NOTE — Progress Notes (Signed)
    Procedures performed today:    None.  Independent interpretation of notes and tests performed by another provider:   None.  Brief History, Exam, Impression, and Recommendations:    Podagra of right great toe Pleasant 76 year old female, multiple episodes of first MTP swelling, she does have some first MTP arthritis and hallux valgus, more recently she had increasing swelling that reached a crescendo over a couple of days, on exam she does have some warmth, redness, tenderness. I think she is having a gout flare. 11 months ago her uric acid levels were 8.3 which is high. Adding 5 days of prednisone, allopurinol, and I would like to recheck her uric acid levels, I will see her back in about a month, we will recheck her uric acid levels again with a goal of less than 5.  Chronic process with exacerbation and pharmacologic intervention  ____________________________________________ Ihor Austin. Benjamin Stain, M.D., ABFM., CAQSM., AME. Primary Care and Sports Medicine [facility]  Adjunct Professor of Family Medicine  [employer] [facility] [employer]  Restaurant manager, fast food

## 2022-01-31 LAB — COMPLETE METABOLIC PANEL WITH GFR
AG Ratio: 1.4 (calc) (ref 1.0–2.5)
ALT: 16 U/L (ref 6–29)
AST: 23 U/L (ref 10–35)
Albumin: 4.4 g/dL (ref 3.6–5.1)
Alkaline phosphatase (APISO): 75 U/L (ref 37–153)
BUN: 23 mg/dL (ref 7–25)
CO2: 31 mmol/L (ref 20–32)
Calcium: 9.8 mg/dL (ref 8.6–10.4)
Chloride: 100 mmol/L (ref 98–110)
Creat: 0.98 mg/dL (ref 0.60–1.00)
Globulin: 3.2 g/dL (calc) (ref 1.9–3.7)
Glucose, Bld: 119 mg/dL — ABNORMAL HIGH (ref 65–99)
Potassium: 4.2 mmol/L (ref 3.5–5.3)
Sodium: 141 mmol/L (ref 135–146)
Total Bilirubin: 0.5 mg/dL (ref 0.2–1.2)
Total Protein: 7.6 g/dL (ref 6.1–8.1)
eGFR: 60 mL/min/{1.73_m2} (ref >=60)

## 2022-01-31 LAB — URIC ACID: Uric Acid, Serum: 8.7 mg/dL — ABNORMAL HIGH (ref 2.5–7.0)

## 2022-01-31 NOTE — Progress Notes (Signed)
Hi [name], uric acid is still around 8 it still elevated.  Believe you are currently taking 1 tab of allopurinol daily.  I would like for you to increase that to 1-1/2 tabs daily and we can recheck your uric acid level in a couple months to see if it is improving.  Also continue to work on low purine diet.  Your kidney function looks a little better this time which is great.

## 2022-01-31 NOTE — Progress Notes (Signed)
Okay, that makes sense.  No that is perfect place to start the 300 mg tab that he wrote for.  I did not look at the date that it was prescribed I assumed she was already taking it.  I apologize.

## 2022-02-01 ENCOUNTER — Ambulatory Visit: Payer: Medicare Other | Admitting: Internal Medicine

## 2022-02-09 ENCOUNTER — Emergency Department (HOSPITAL_COMMUNITY)
Admission: EM | Admit: 2022-02-09 | Discharge: 2022-02-09 | Disposition: A | Discharge status: Home / Self Care | Payer: Medicare Other | Attending: Emergency Medicine | Admitting: Emergency Medicine

## 2022-02-09 ENCOUNTER — Encounter (HOSPITAL_COMMUNITY): Payer: Self-pay | Admitting: *Deleted

## 2022-02-09 ENCOUNTER — Emergency Department (HOSPITAL_COMMUNITY): Payer: Medicare Other

## 2022-02-09 ENCOUNTER — Other Ambulatory Visit: Payer: Self-pay

## 2022-02-09 DIAGNOSIS — R531 Weakness: Secondary | ICD-10-CM

## 2022-02-09 DIAGNOSIS — R918 Other nonspecific abnormal finding of lung field: Secondary | ICD-10-CM | POA: Diagnosis not present

## 2022-02-09 DIAGNOSIS — R739 Hyperglycemia, unspecified: Secondary | ICD-10-CM | POA: Insufficient documentation

## 2022-02-09 DIAGNOSIS — R6889 Other general symptoms and signs: Secondary | ICD-10-CM | POA: Diagnosis not present

## 2022-02-09 DIAGNOSIS — R5383 Other fatigue: Secondary | ICD-10-CM | POA: Diagnosis not present

## 2022-02-09 DIAGNOSIS — R059 Cough, unspecified: Secondary | ICD-10-CM | POA: Diagnosis not present

## 2022-02-09 DIAGNOSIS — Z79899 Other long term (current) drug therapy: Secondary | ICD-10-CM | POA: Insufficient documentation

## 2022-02-09 DIAGNOSIS — R61 Generalized hyperhidrosis: Secondary | ICD-10-CM | POA: Diagnosis not present

## 2022-02-09 DIAGNOSIS — Z743 Need for continuous supervision: Secondary | ICD-10-CM | POA: Diagnosis not present

## 2022-02-09 LAB — BASIC METABOLIC PANEL
Anion gap: 9 (ref 5–15)
BUN: 19 mg/dL (ref 8–23)
CO2: 28 mmol/L (ref 22–32)
Calcium: 8.7 mg/dL — ABNORMAL LOW (ref 8.9–10.3)
Chloride: 96 mmol/L — ABNORMAL LOW (ref 98–111)
Creatinine, Ser: 1.29 mg/dL — ABNORMAL HIGH (ref 0.44–1.00)
GFR, Estimated: 43 mL/min — ABNORMAL LOW (ref >60)
Glucose, Bld: 129 mg/dL — ABNORMAL HIGH (ref 70–99)
Potassium: 3.6 mmol/L (ref 3.5–5.1)
Sodium: 133 mmol/L — ABNORMAL LOW (ref 135–145)

## 2022-02-09 LAB — CBC
HCT: 34.8 % — ABNORMAL LOW (ref 36.0–46.0)
Hemoglobin: 11 g/dL — ABNORMAL LOW (ref 12.0–15.0)
MCH: 27 pg (ref 26.0–34.0)
MCHC: 31.6 g/dL (ref 30.0–36.0)
MCV: 85.3 fL (ref 80.0–100.0)
Platelets: 214 10*3/uL (ref 150–400)
RBC: 4.08 MIL/uL (ref 3.87–5.11)
RDW: 14.5 % (ref 11.5–15.5)
WBC: 5.9 10*3/uL (ref 4.0–10.5)
nRBC: 0 % (ref 0.0–0.2)

## 2022-02-09 LAB — CBG MONITORING, ED: Glucose-Capillary: 109 mg/dL — ABNORMAL HIGH (ref 70–99)

## 2022-02-09 NOTE — ED Provider Notes (Signed)
[facility] EMERGENCY DEPARTMENT Provider Note   CSN: [id number] Arrival date & time: 02/09/22  1334     History  Chief Complaint  Patient presents with   Hyperglycemia   Fatigue    [name] is a 76 y.o. female.  Patient reports she began feeling sweaty and nauseated earlier today.  Patient reports EMS checked her glucose and told her it was over 400.  Patient states EMS advised her to come to the emergency department for evaluation.  Patient states that she did not experience any syncope she did not have any chest pain she did not have any shortness of breath.  Patient reports that she has had a cough for several days.  She has not had any shortness of breath.  Patient currently denies any symptoms.  She states when she got here to the emergency department and her glucose was checked the nurse told her it was 109.  The history is provided by the patient. No language interpreter was used.  Hyperglycemia Blood sugar level PTA:  400 Severity:  Moderate Onset quality:  Sudden      Home Medications Prior to Admission medications   Medication Sig Start Date End Date Taking? Authorizing Provider  allopurinol (ZYLOPRIM) 300 MG tablet Take 1 tablet (300 mg total) by mouth daily. 01/30/22   Monica Becton, MD  amLODipine (NORVASC) 10 MG tablet TAKE 1 TABLET BY MOUTH EVERY DAY 09/11/21   Agapito Games, MD  benzonatate (TESSALON) 200 MG capsule Take 1 capsule (200 mg total) by mouth 2 (two) times daily as needed for cough. 01/25/22   Everrett Coombe, DO  Calcium Carbonate-Vitamin D 600-200 MG-UNIT TABS Take by mouth.    [provider]  colchicine 0.6 MG tablet Take 1 tablet (0.6 mg total) by mouth daily as needed. Can take up to 1 tablet twice a day during acute gout flares 02/22/21   Clayborne Dana, NP  Ferrous Gluconate (IRON 27 PO) Take 27 mg by mouth daily.    [provider]  magnesium oxide (MAG-OX) 400 MG tablet Take 400 mg by mouth daily.     [provider]  Multiple Vitamins-Minerals (CENTRUM SILVER ADULT 50+) TABS Take 1 tablet by mouth daily.    [provider]  Multiple Vitamins-Minerals (MULTIVITAMIN PO) Take by mouth.    [provider]  Omega-3 Fatty Acids (FISH OIL) 1200 MG CAPS Take by mouth.    [provider]  predniSONE (DELTASONE) 50 MG tablet One tab PO daily for 5 days. 01/30/22   Monica Becton, MD  Psyllium (METAMUCIL PO) Take by mouth.    [provider]  rosuvastatin (CRESTOR) 10 MG tablet Take 1 tablet (10 mg total) by mouth at bedtime. 04/13/21   Agapito Games, MD  TURMERIC CURCUMIN PO Take 1,000 mg by mouth daily.    [provider]  valsartan-hydrochlorothiazide (DIOVAN-HCT) 320-25 MG tablet Take 1 tablet by mouth daily. 04/13/21   Agapito Games, MD  vitamin C (ASCORBIC ACID) 500 MG tablet Take 500 mg by mouth daily.    [provider]  VITAMIN D PO Take 1 tablet by mouth daily.    [provider]  Vitamin E 90 MG (200 UNIT) CAPS Take 1 tablet by mouth daily.    [provider]      Allergies    Penicillins    Review of Systems   Review of Systems  All other systems reviewed and are negative.  Physical Exam Updated Vital Signs BP 139/64 (BP Location: Right Arm)   Pulse 81   Temp 98.1 F (36.7 C) (Oral)   Resp 18   Ht 5\' 9"  (1.753 m)   Wt 96.6 kg   SpO2 100%   BMI 31.45 kg/m  Physical Exam Vitals and nursing note reviewed.  Constitutional:      Appearance: She is well-developed.  HENT:     Head: Normocephalic.  Cardiovascular:     Rate and Rhythm: Normal rate.  Pulmonary:     Effort: Pulmonary effort is normal.  Abdominal:     General: There is no distension.  Musculoskeletal:        General: Normal range of motion.     Cervical back: Normal range of motion.  Skin:    General: Skin is warm.  Neurological:     General: No focal deficit present.     Mental Status: She is alert  and oriented to person, place, and time.     ED Results / Procedures / Treatments   Labs (all labs ordered are listed, but only abnormal results are displayed) Labs Reviewed  BASIC METABOLIC PANEL - Abnormal; Notable for the following components:      Result Value   Sodium 133 (*)    Chloride 96 (*)    Glucose, Bld 129 (*)    Creatinine, Ser 1.29 (*)    Calcium 8.7 (*)    GFR, Estimated 43 (*)    All other components within normal limits  CBC - Abnormal; Notable for the following components:   Hemoglobin 11.0 (*)    HCT 34.8 (*)    All other components within normal limits  CBG MONITORING, ED - Abnormal; Notable for the following components:   Glucose-Capillary 109 (*)    All other components within normal limits  URINALYSIS, ROUTINE W REFLEX MICROSCOPIC  CBG MONITORING, ED    EKG None  Radiology DG Chest 2 View  Result Date: 02/09/2022 CLINICAL DATA:  Cough EXAM: CHEST - 2 VIEW COMPARISON:  AP chest 02/09/2022, chest two views 04/14/2018 FINDINGS: Cardiac silhouette and mediastinal contours are within normal limits. Moderate calcification within the aortic arch. The lungs are clear. The previously seen left peripheral basilar opacity is not visualized on the current frontal view, and the lower lungs are clear on lateral view. No pleural effusion or pneumothorax. Moderate multilevel degenerative disc changes of the thoracic spine. IMPRESSION: No active cardiopulmonary disease. Electronically Signed   By: 04/16/2018 M.D.   On: 02/09/2022 17:45   DG Chest Port 1 View  Result Date: 02/09/2022 CLINICAL DATA:  Cough EXAM: PORTABLE CHEST 1 VIEW COMPARISON:  Radiograph 04/14/2018 FINDINGS: Unchanged cardiomediastinal silhouette. Left peripheral basilar opacity. No pleural effusion or evidence of pneumothorax. No acute osseous abnormality. Thoracic spondylosis. IMPRESSION: Left peripheral basilar opacity, favored to be due to overlying soft tissue, but potentially atelectasis or  infection. Consider PA and lateral radiograph, if able. Electronically Signed   By: 04/16/2018 M.D.   On: 02/09/2022 16:51    Procedures Procedures    Medications Ordered in ED Medications - No data to display  ED Course/ Medical Decision Making/ A&P                           Medical Decision Making Patient was informed earlier today that her glucose was over 400.  Amount and/or Complexity of Data Reviewed Independent Historian: friend    Details: Patient is  here with friends who are supportive External Data Reviewed: labs.    Details: Previous laboratory evaluations reviewed Labs: ordered. Decision-making details documented in ED Course.    Details: Labs ordered reviewed and interpreted patient has a creatinine of 1.29 BUN is normal.  Patient is noted to have chronic kidney disease Radiology: ordered. Decision-making details documented in ED Course.    Details: X-ray shows no evidence of acute disease  Risk Risk Details: Patient observed during evaluation she has no chest pain no headache no further symptoms.  I suspect blood sugar by EMS was an error as both CBG and basic metabolic panel are not significantly elevated.  Patient feels well she was given 800 cc of IV fluid by EMS.  Patient is counseled on her laboratory evaluation she is advised to schedule to see her primary care physician for recheck.           Final Clinical Impression(s) / ED Diagnoses Final diagnoses:  Weakness    Rx / DC Orders ED Discharge Orders     None     An After Visit Summary was printed and given to the patient.     Elson Areas, Cordelia Poche 02/09/22 2238    Bethann Berkshire, MD 02/10/22 1108

## 2022-02-09 NOTE — Discharge Instructions (Addendum)
Drink plenty of fluids.  See your Physician for recheck.

## 2022-02-09 NOTE — ED Triage Notes (Addendum)
Pt BIB [facility] for generalized weakness and diaphoresis suddenly.  EMS noted blood sugar was elevated.  IV started and total of 800cc  of iv fluid given en route. Pt feels fatigued currently. Denies any CP.

## 2022-02-12 ENCOUNTER — Telehealth: Payer: Self-pay | Admitting: Neurology

## 2022-02-12 NOTE — Telephone Encounter (Signed)
Patient is scheduled for 02/13/2022 @ 3:40. Tvt

## 2022-02-12 NOTE — Telephone Encounter (Signed)
Spoke with patient, she states she was packing boxes on Saturday and started feeling weak. She states the people she was with weren't getting a response from her so they called EMS. EMS checked blood sugars which were around 450. When she got to the hospital the reading was 109, the provider told her that their machine must have had an error.   Patient states no more feelings like the above, but ongoing sick symptoms for the last month. She has been treated but no better. Can we call patient to get a visit this week for evaluation?   Dr. Linford Arnold - FYI on above.

## 2022-02-12 NOTE — Telephone Encounter (Signed)
Patient [name] was seen at [facility] Friday. She had some questions.

## 2022-02-13 ENCOUNTER — Encounter: Payer: Self-pay | Admitting: Family Medicine

## 2022-02-13 ENCOUNTER — Telehealth: Payer: Self-pay | Admitting: Family Medicine

## 2022-02-13 ENCOUNTER — Ambulatory Visit (INDEPENDENT_AMBULATORY_CARE_PROVIDER_SITE_OTHER): Payer: Medicare Other | Admitting: Family Medicine

## 2022-02-13 VITALS — BP 125/80 | HR 83 | Temp 98.7°F | Ht 69.0 in | Wt 212.0 lb

## 2022-02-13 DIAGNOSIS — E871 Hypo-osmolality and hyponatremia: Secondary | ICD-10-CM | POA: Diagnosis not present

## 2022-02-13 DIAGNOSIS — R011 Cardiac murmur, unspecified: Secondary | ICD-10-CM

## 2022-02-13 DIAGNOSIS — R748 Abnormal levels of other serum enzymes: Secondary | ICD-10-CM | POA: Diagnosis not present

## 2022-02-13 DIAGNOSIS — R051 Acute cough: Secondary | ICD-10-CM

## 2022-02-13 DIAGNOSIS — E1169 Type 2 diabetes mellitus with other specified complication: Secondary | ICD-10-CM | POA: Diagnosis not present

## 2022-02-13 DIAGNOSIS — R5383 Other fatigue: Secondary | ICD-10-CM | POA: Diagnosis not present

## 2022-02-13 DIAGNOSIS — R7989 Other specified abnormal findings of blood chemistry: Secondary | ICD-10-CM | POA: Diagnosis not present

## 2022-02-13 MED ORDER — HYDROCODONE BIT-HOMATROP MBR 5-1.5 MG/5ML PO SOLN: 5.0000 mL | Freq: Three times a day (TID) | ORAL | 0 refills | Status: DC | PRN

## 2022-02-13 MED ORDER — DOXYCYCLINE HYCLATE 100 MG PO TABS: 100.0000 mg | ORAL_TABLET | Freq: Two times a day (BID) | ORAL | 0 refills | Status: DC

## 2022-02-13 NOTE — Progress Notes (Signed)
Established Patient Office Visit  Subjective   Patient ID: [name], female    DOB: 1945-07-20  Age: 76 y.o. MRN: [id number]  Chief Complaint  Patient presents with   Cough    HPI [name] is here today for recent elevation in blood glucose levels.  She actually recently went to the emergency department at [facility] on December 15 because she was feeling sweaty and nauseated and checked her sugar and it was greater than 400.  She says that a friend that was with her says she was out of it so they called EMS.  By the time she got to the ED her blood sugar was under 200 so they think it may have just been an error.  But her sodium was a little low as well as her chloride and I thought maybe she was a little dehydrated so they gave her some IV fluids.  He has a history of prediabetes, that her last A1c on November 22 was elevated at 6.6 in the diabetes range.  Was placed on steroids around December 5 for 5 days.  She just feels completely exhausted.  The cough is keeping her up at night.  Lab Results  Component Value Date   HGBA1C 6.6 (A) 01/17/2022      ROS    Objective:     BP 125/80   Pulse 83   Temp 98.7 F (37.1 C) (Oral)   Ht 5\' 9"  (1.753 m)   Wt 212 lb (96.2 kg)   SpO2 99%   BMI 31.31 kg/m    Physical Exam Constitutional:      Appearance: She is well-developed.  HENT:     Head: Normocephalic and atraumatic.     Right Ear: External ear normal.     Left Ear: External ear normal.     Nose: Nose normal.  Eyes:     Conjunctiva/sclera: Conjunctivae normal.     Pupils: Pupils are equal, round, and reactive to light.  Neck:     Thyroid: No thyromegaly.  Cardiovascular:     Rate and Rhythm: Normal rate and regular rhythm.     Heart sounds: Murmur heard.     Comments: 3/6 SEM Pulmonary:     Effort: Pulmonary effort is normal.     Breath sounds: Normal breath sounds. No wheezing.  Musculoskeletal:     Cervical back: Neck supple.  Lymphadenopathy:      Cervical: No cervical adenopathy.  Skin:    General: Skin is warm and dry.  Neurological:     Mental Status: She is alert and oriented to person, place, and time.      No results found for any visits on 02/13/22.    The 10-year ASCVD risk score (Arnett DK, et al., 2019) is: 24.4%    Assessment & Plan:   Problem List Items Addressed This Visit       Endocrine   Diabetes type 2, controlled (HCC) - Primary     Other   Heart murmur   Relevant Orders   ECHOCARDIOGRAM COMPLETE   Other Visit Diagnoses     Acute cough       Relevant Medications   doxycycline (VIBRA-TABS) 100 MG tablet   HYDROcodone bit-homatropine (HYCODAN) 5-1.5 MG/5ML syrup   Hyponatremia       Relevant Orders   COMPLETE METABOLIC PANEL WITH GFR   Hypocalcemia       Relevant Orders   COMPLETE METABOLIC PANEL WITH GFR   Other fatigue  Acute sinusitis with chronic cough x 1 month.  Chest x-ray is negative.  Can go ahead and treat for sinusitis at this point she has had intermittent nasal congestion and drainage.  Will treat with doxycycline.  Given prescription for cough medicine at night to help her rest.  If not better after Christmas then please let us know.  On her labs that were done in the emergency room her sodium was low, calcium was low, and chloride was low as well.  Will repeat labs today.  Heart murmur heard on exam today unsure if this is related to her acute symptoms or not.  Will go ahead and get echocardiogram ordered and scheduled  No follow-ups on file.    Beatrice Lecher, MD

## 2022-02-13 NOTE — Telephone Encounter (Signed)
Pls call pt: I did hear a heart murmur on exam.  I am ordering an echo.

## 2022-02-14 ENCOUNTER — Telehealth: Payer: Self-pay

## 2022-02-14 ENCOUNTER — Other Ambulatory Visit: Payer: Self-pay | Admitting: Family Medicine

## 2022-02-14 DIAGNOSIS — R051 Acute cough: Secondary | ICD-10-CM

## 2022-02-14 LAB — COMPLETE METABOLIC PANEL WITH GFR
AG Ratio: 1.5 (calc) (ref 1.0–2.5)
ALT: 72 U/L — ABNORMAL HIGH (ref 6–29)
AST: 82 U/L — ABNORMAL HIGH (ref 10–35)
Albumin: 4.2 g/dL (ref 3.6–5.1)
Alkaline phosphatase (APISO): 59 U/L (ref 37–153)
BUN/Creatinine Ratio: 15 (calc) (ref 6–22)
BUN: 22 mg/dL (ref 7–25)
CO2: 28 mmol/L (ref 20–32)
Calcium: 9.1 mg/dL (ref 8.6–10.4)
Chloride: 95 mmol/L — ABNORMAL LOW (ref 98–110)
Creat: 1.45 mg/dL — ABNORMAL HIGH (ref 0.60–1.00)
Globulin: 2.8 g/dL (calc) (ref 1.9–3.7)
Glucose, Bld: 106 mg/dL — ABNORMAL HIGH (ref 65–99)
Potassium: 4.1 mmol/L (ref 3.5–5.3)
Sodium: 135 mmol/L (ref 135–146)
Total Bilirubin: 0.6 mg/dL (ref 0.2–1.2)
Total Protein: 7 g/dL (ref 6.1–8.1)
eGFR: 37 mL/min/{1.73_m2} — ABNORMAL LOW (ref >=60)

## 2022-02-14 MED ORDER — HYDROCODONE BIT-HOMATROP MBR 5-1.5 MG/5ML PO SOLN: 5.0000 mL | Freq: Three times a day (TID) | ORAL | 0 refills | Status: DC | PRN

## 2022-02-14 NOTE — Progress Notes (Signed)
Hi [name], your electrolytes were mostly corrected back to normal which is great.  Sodium was back to normal, calcium back to normal.  Kidney function is definitely up.  Your baseline is closer to 1 and it is 1.4.  Would like to get an abdominal ultrasound.  I have also ordered an echocardiogram of your heart because I did hear a murmur yesterday.

## 2022-02-14 NOTE — Addendum Note (Signed)
Addended by: Nani Gasser D on: 02/14/2022 07:41 AM   Modules accepted: Orders

## 2022-02-14 NOTE — Telephone Encounter (Signed)
Patient advised.

## 2022-02-14 NOTE — Telephone Encounter (Signed)
See other message

## 2022-02-14 NOTE — Telephone Encounter (Signed)
Jezelle called and left a message stating the pharmacy doesn't have the Hycodan. She would like it sent to [facility] in [facility] on [address]   Andrez Grime prescription and pharmacy.

## 2022-02-14 NOTE — Telephone Encounter (Signed)
Called patient to inform her that Echo will be ordered by Dr. Linford Arnold due to murmur and patient states that her pharmacy does not have the cough syrup but [facility] on [address] [facility] it.

## 2022-02-14 NOTE — Telephone Encounter (Signed)
Meds ordered this encounter  °Medications  ° HYDROcodone bit-homatropine (HYCODAN) 5-1.5 MG/5ML syrup  °  Sig: Take 5 mLs by mouth every 8 (eight) hours as needed for cough.  °  Dispense:  120 mL  °  Refill:  0  ° ° °

## 2022-02-20 ENCOUNTER — Telehealth: Payer: Self-pay

## 2022-02-20 NOTE — Telephone Encounter (Signed)
[name] called and wanted Dr Linford Arnold to know that she stopped the Hycodan on Christmas eve due to itchy rash. She is taking benadryl for the rash.   Added to allergy list

## 2022-02-22 ENCOUNTER — Ambulatory Visit (INDEPENDENT_AMBULATORY_CARE_PROVIDER_SITE_OTHER): Payer: Medicare Other

## 2022-02-22 DIAGNOSIS — R7989 Other specified abnormal findings of blood chemistry: Secondary | ICD-10-CM | POA: Diagnosis not present

## 2022-02-22 DIAGNOSIS — R748 Abnormal levels of other serum enzymes: Secondary | ICD-10-CM | POA: Diagnosis not present

## 2022-02-22 DIAGNOSIS — R945 Abnormal results of liver function studies: Secondary | ICD-10-CM | POA: Diagnosis not present

## 2022-02-22 DIAGNOSIS — K7689 Other specified diseases of liver: Secondary | ICD-10-CM | POA: Diagnosis not present

## 2022-02-23 ENCOUNTER — Encounter: Payer: Self-pay | Admitting: Family Medicine

## 2022-02-23 ENCOUNTER — Ambulatory Visit: Payer: Medicare Other | Admitting: Family Medicine

## 2022-02-23 VITALS — BP 90/50 | HR 99 | Temp 97.8°F | Ht 69.0 in | Wt 213.1 lb

## 2022-02-23 DIAGNOSIS — T7840XA Allergy, unspecified, initial encounter: Secondary | ICD-10-CM | POA: Insufficient documentation

## 2022-02-23 DIAGNOSIS — L299 Pruritus, unspecified: Secondary | ICD-10-CM

## 2022-02-23 MED ORDER — PREDNISONE 10 MG PO TABS: 10.0000 mg | ORAL_TABLET | Freq: Every day | ORAL | 0 refills | Status: DC

## 2022-02-23 MED ORDER — CETIRIZINE HCL 10 MG PO TABS: 10.0000 mg | ORAL_TABLET | Freq: Every day | ORAL | 0 refills | Status: DC

## 2022-02-23 NOTE — Progress Notes (Signed)
Acute Office Visit  Subjective:     Patient ID: [name], female    DOB: Jun 09, 1945, 76 y.o.   MRN: [id number]  Chief Complaint  Patient presents with   Acute Visit    Patient in office c/o viral illness - seen by dr. Linford Arnold - cough x 1 mth - cough medication caused itching - cough is somewhat better but still itching and rash - patient has stopped the cough medication  on 02/18/22,    HPI Presents today for an acute visit with complaint of "I've had a virus for a while now, Dr. Linford Arnold gave me some cough medicine and it caused me to start itching".   Started cough med 12/20, itching started a few days after starting the medications,  took a few more doses as she was unsure what was causing the itching, last dose of cough medicine on 12/24.  (Started Doxycycline x 7 days, which was ordered and started on 12/20 as well). Itching has not improved since stopping cough med. She completed the full course of doxycycline around 12/26.  Symptoms have been present  a few days after since starting cough medicine and antibiotic on 12/20.  Associated symptoms include: no shortness of breath, no lip or throat swelling.  feeling fatigue from current illness. Rash on arms, legs, chest.  Treatments tried include : lotions,  oral benadryl  Treatment effective : not effective.  Sick contacts : n/a No respiratory symptoms. No throat swelling or lip swelling.    Review of Systems  Constitutional:  Positive for malaise/fatigue. Negative for chills and fever.  Respiratory:  Negative for shortness of breath.        No throat/lip swelling. No shortness of breath.   Skin:  Positive for itching and rash.       Started cough medicine and doxycycline on 1220/23.         Objective:    BP (!) 90/50   Pulse 99   Temp 97.8 F (36.6 C)   Ht 5\' 9"  (1.753 m)   Wt 213 lb 2 oz (96.7 kg)   SpO2 99%   BMI 31.47 kg/m    Physical Exam Vitals and nursing note reviewed.  Constitutional:       General: She is not in acute distress.    Appearance: Normal appearance.     Comments: Does not feel well from ongoing illness for one month.   HENT:     Mouth/Throat:     Comments: No angioedema present.  Pulmonary:     Effort: Pulmonary effort is normal.     Breath sounds: Normal breath sounds.  Skin:    General: Skin is warm and dry.     Comments: Diffuse macular erythematous rash on bilateral arms, upper chest, and legs. Erythema noted from frequent scratching.   Neurological:     General: No focal deficit present.     Mental Status: She is alert. Mental status is at baseline.  Psychiatric:        Mood and Affect: Mood normal.        Behavior: Behavior normal.     No results found for any visits on 02/23/22.      Assessment & Plan:   Problem List Items Addressed This Visit     Allergic reaction   Relevant Medications   cetirizine (ZYRTEC) 10 MG tablet   predniSONE (DELTASONE) 10 MG tablet   Pruritic condition - Primary   Relevant Medications   cetirizine (ZYRTEC) 10  MG tablet   predniSONE (DELTASONE) 10 MG tablet  Started hydrocodone-homatropine and doxycycline for ongoing illness on 12/20. Reports pruritus started a few days after starting these medications. She attributed the itching to the cough medicine and continued taking the doxycycline for the full course of 7 days.Today is day # 3 since completing antibiotic.  Denies any shortness of breath or throat swelling/lip swelling. Continues to have itching and a diffuse macular erythematous rash on arms, chest, and legs. Cetirizine daily for next 2 weeks. Prednisone daily for next 5 days. Recommend benadryl cream, oatmeal bath, avoiding hot showers/baths, and to use mild soap until pruritus resolves.  Unsure if this reaction is from hydrocodone-homatropine vs. doxycycline. Continued taking doxycycline 2 days after stopping cough medicine.  12/19: GFR 37, unable to prescribe hydroxyzine.  Agrees with plan of care  discussed today. Questions answered. She will follow-up with Dr. Linford Arnold as needed.  Symptoms reviewed with patient that warrant seeking higher level of care.    Return if symptoms worsen or fail to improve.  Novella Olive, FNP

## 2022-02-23 NOTE — Progress Notes (Signed)
[name], ultrasound shows no mass or lesions which is very reassuring.  They did see fatty infiltration of the liver.  This can increase your risk for developing scarring, which then leads to cirrhosis of the liver.  Just encourage you to really work on healthy food choices and regular exercise to reduce the fatty content of the liver, which also reduces inflammation liver, and thus reduces the risk of developing cirrhosis.  Did note just a little bit of fluid around the right kidney I am not sure why they were not necessarily targeting the kidney on the exam so they just made a comment about it just let me know if you are having any urinary symptoms such as frequency or burning with urination if you do then I would like to get a urine sample.  Plan to recheck your liver enzymes with blood work in about 2 to 3 weeks.

## 2022-02-23 NOTE — Patient Instructions (Addendum)
Prednisone warning: Prednisone is an anti-inflammatory medication. It is best to only use it for short periods of time. It can cause  your blood pressure to increase and it can cause your blood sugar to increase.   Comfort measures as discussed.   Benadryl cream may also help.  Cetirizine daily for next 2 weeks.  Prednisone orally once daily for next 5 days.   Follow-up with Dr. Linford Arnold for cold symptoms.

## 2022-02-27 ENCOUNTER — Ambulatory Visit (INDEPENDENT_AMBULATORY_CARE_PROVIDER_SITE_OTHER): Payer: Medicare Other | Admitting: Sports Medicine

## 2022-02-27 ENCOUNTER — Encounter: Payer: Self-pay | Admitting: Sports Medicine

## 2022-02-27 VITALS — BP 94/59 | HR 98 | Wt 216.0 lb

## 2022-02-27 DIAGNOSIS — E1169 Type 2 diabetes mellitus with other specified complication: Secondary | ICD-10-CM

## 2022-02-27 DIAGNOSIS — B349 Viral infection, unspecified: Secondary | ICD-10-CM

## 2022-02-27 DIAGNOSIS — M109 Gout, unspecified: Secondary | ICD-10-CM

## 2022-02-27 LAB — POCT INFLUENZA A/B
Influenza A, POC: NEGATIVE
Influenza B, POC: NEGATIVE

## 2022-02-27 LAB — POC COVID19 BINAXNOW: SARS Coronavirus 2 Ag: NEGATIVE

## 2022-02-27 MED ORDER — BENZONATATE 200 MG PO CAPS: 200.0000 mg | ORAL_CAPSULE | Freq: Three times a day (TID) | ORAL | 0 refills | Status: DC | PRN

## 2022-02-27 MED ORDER — HYDROXYZINE PAMOATE 25 MG PO CAPS: 25.0000 mg | ORAL_CAPSULE | Freq: Three times a day (TID) | ORAL | 0 refills | Status: DC | PRN

## 2022-02-27 NOTE — Assessment & Plan Note (Signed)
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 We had some prednisone, allopurinol, we will recheck her uric acid levels today, toe feels pretty good

## 2022-02-27 NOTE — Addendum Note (Signed)
Addended by: Tarri Glenn A on: 02/27/2022 11:45 AM   Modules accepted: Orders

## 2022-02-27 NOTE — Assessment & Plan Note (Addendum)
[name] is a pleasant 77 year old female with multiple medical problems, she has had a few issues along the way, she had what sounded to be a gout flare, we added some steroids, her blood sugar went up to about 400, she was seen in the ED where it had already decreased to 200, she was discharged without further complication. Her joint aches improved considerably. She was then seen by one of our partners, had another course of prednisone, not given antihistamines. She tells me that she has been tremendously fatigued, she has had a cough for about 3 weeks, has not been tested for COVID or flu. She stopped her hydrocodone containing cough syrup around Christmas but continues to have an itch. I suspect the itch is likely virally mediated, we will add hydroxyzine, recheck her labs and swab her for COVID and flu today. Also adding Tessalon Perles for her cough.

## 2022-02-27 NOTE — Progress Notes (Addendum)
    Procedures performed today:    None.  Independent interpretation of notes and tests performed by another provider:   None.  Brief History, Exam, Impression, and Recommendations:    Viral syndrome Carey is a pleasant 77 year old female with multiple medical problems, she has had a few issues along the way, she had what sounded to be a gout flare, we added some steroids, her blood sugar went up to about 400, she was seen in the ED where it had already decreased to 200, she was discharged without further complication. Her joint aches improved considerably. She was then seen by one of our partners, had another course of prednisone, not given antihistamines. She tells me that she has been tremendously fatigued, she has had a cough for about 3 weeks, has not been tested for COVID or flu. She stopped her hydrocodone containing cough syrup around Christmas but continues to have an itch. I suspect the itch is likely virally mediated, we will add hydroxyzine, recheck her labs and swab her for COVID and flu today. Also adding Tessalon Perles for her cough.  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 We had some prednisone, allopurinol, we will recheck her uric acid levels today, toe feels pretty good    ____________________________________________ Gwen Her. Dianah Field, M.D., ABFM., CAQSM., AME. Primary Care and Sports Medicine [facility]  Adjunct Professor of [employer]  Risk manager

## 2022-02-27 NOTE — Addendum Note (Signed)
Addended by: Silverio Decamp on: 02/27/2022 04:33 PM   Modules accepted: Orders

## 2022-02-28 LAB — COMPLETE METABOLIC PANEL WITH GFR
AG Ratio: 1.9 (calc) (ref 1.0–2.5)
ALT: 40 U/L — ABNORMAL HIGH (ref 6–29)
AST: 31 U/L (ref 10–35)
Albumin: 4.1 g/dL (ref 3.6–5.1)
Alkaline phosphatase (APISO): 66 U/L (ref 37–153)
BUN/Creatinine Ratio: 24 (calc) — ABNORMAL HIGH (ref 6–22)
BUN: 31 mg/dL — ABNORMAL HIGH (ref 7–25)
CO2: 30 mmol/L (ref 20–32)
Calcium: 10.1 mg/dL (ref 8.6–10.4)
Chloride: 101 mmol/L (ref 98–110)
Creat: 1.28 mg/dL — ABNORMAL HIGH (ref 0.60–1.00)
Globulin: 2.2 g/dL (calc) (ref 1.9–3.7)
Glucose, Bld: 127 mg/dL — ABNORMAL HIGH (ref 65–99)
Potassium: 5.4 mmol/L — ABNORMAL HIGH (ref 3.5–5.3)
Sodium: 140 mmol/L (ref 135–146)
Total Bilirubin: 0.6 mg/dL (ref 0.2–1.2)
Total Protein: 6.3 g/dL (ref 6.1–8.1)
eGFR: 43 mL/min/{1.73_m2} — ABNORMAL LOW (ref >=60)

## 2022-02-28 LAB — CBC WITH DIFFERENTIAL/PLATELET
Absolute Monocytes: 1015 cells/uL — ABNORMAL HIGH (ref 200–950)
Basophils Absolute: 43 cells/uL (ref 0–200)
Basophils Relative: 0.6 %
Eosinophils Absolute: 1385 cells/uL — ABNORMAL HIGH (ref 15–500)
Eosinophils Relative: 19.5 %
HCT: 33.7 % — ABNORMAL LOW (ref 35.0–45.0)
Hemoglobin: 11.1 g/dL — ABNORMAL LOW (ref 11.7–15.5)
Lymphs Abs: 1541 cells/uL (ref 850–3900)
MCH: 28.3 pg (ref 27.0–33.0)
MCHC: 32.9 g/dL (ref 32.0–36.0)
MCV: 86 fL (ref 80.0–100.0)
MPV: 9.7 fL (ref 7.5–12.5)
Monocytes Relative: 14.3 %
Neutro Abs: 3117 cells/uL (ref 1500–7800)
Neutrophils Relative %: 43.9 %
Platelets: 327 10*3/uL (ref 140–400)
RBC: 3.92 10*6/uL (ref 3.80–5.10)
RDW: 15.2 % — ABNORMAL HIGH (ref 11.0–15.0)
Total Lymphocyte: 21.7 %
WBC: 7.1 10*3/uL (ref 3.8–10.8)

## 2022-02-28 LAB — URIC ACID: Uric Acid, Serum: 5.3 mg/dL (ref 2.5–7.0)

## 2022-03-02 ENCOUNTER — Encounter: Payer: Self-pay | Admitting: Family Medicine

## 2022-03-02 ENCOUNTER — Ambulatory Visit: Payer: Medicare Other | Admitting: Family Medicine

## 2022-03-02 VITALS — BP 106/58 | HR 94 | Temp 98.2°F | Ht 69.0 in | Wt 211.6 lb

## 2022-03-02 DIAGNOSIS — L509 Urticaria, unspecified: Secondary | ICD-10-CM

## 2022-03-02 DIAGNOSIS — T7840XS Allergy, unspecified, sequela: Secondary | ICD-10-CM | POA: Diagnosis not present

## 2022-03-02 DIAGNOSIS — I959 Hypotension, unspecified: Secondary | ICD-10-CM | POA: Diagnosis not present

## 2022-03-02 MED ORDER — FLUTICASONE PROPIONATE 50 MCG/ACT NA SUSP: 1.0000 | Freq: Every day | NASAL | 0 refills | Status: DC

## 2022-03-02 NOTE — Progress Notes (Signed)
Acute Office Visit  Subjective:     Patient ID: CAMYRA VAETH, female    DOB: August 01, 1945, 77 y.o.   MRN: [id number]  Chief Complaint  Patient presents with   Acute Visit    Patient in office c/o weakness, dizziness, bilateral eyes swollen  and still itching - feeling like eye swelling  was allergic to medication ( hydroxyzine) - was also recently found to be allergic to hydrocodone. - OTC medications such as benadryl not helping.     HPI Patient is in today for acute visit  Pt reports she was sick with URI. She was given Doxycycline and Hycodan syrup on 12/20. She developed itching and stopped it on Christmas Eve. She was seen by [facility] FNP on 02/23/22 for continued itching. Given steroids and Zyrtec. This hasn't helped. She saw Dr T for gouty flare and itching on Feb 27 2022. She was given Tessalon perles and Hydroxyzine. Pt is here today with worsening itching and now eyelid swelling. Pt has HTN and taking Amlodipine for the last 10+ years along with newest medicines Crestor and Valsartan/HCTZ since Feb 2023. She also takes multivitamins. She denies new detergents, new foods etc. She reports she really hasn't been taking the Zyrtec. Pt denies chest pains, SOB, wheezing, lip swelling, throat swelling, tongue swelling.  Patient Active Problem List   Diagnosis Date Noted   Viral syndrome 02/27/2022   Pruritic condition 02/23/2022   Heart murmur 02/13/2022   Hair loss 07/18/2021   Podagra of right great toe 04/21/2021   History of total abdominal hysterectomy 11/25/2019   Keloid scar 11/04/2019   Nocturia 05/20/2019   Primary osteoarthritis of right hip 07/29/2018   Osteoarthritis of knee 05/08/2018   Pain in right knee 04/02/2018   Spinal stenosis 05/05/2016   Chronic kidney disease (CKD) stage G3b/A1, moderately decreased glomerular filtration rate (GFR) between 30-44 mL/min/1.73 square meter and albuminuria creatinine ratio less than 30 mg/g (HCC) 06/11/2014   Diabetes type 2,  controlled ([place]) 06/11/2014   Left shoulder pain 07/27/2013   Primary osteoarthritis of both knees 07/27/2013   Lumbar degenerative disc disease 04/07/2013   Pelvic kidney 07/30/2012   Hyperlipidemia 01/06/2008   HYPERTENSION, BENIGN 01/06/2008    Review of Systems  Constitutional:  Positive for malaise/fatigue. Negative for chills.  HENT:  Negative for congestion and sore throat.   Respiratory:  Negative for cough, sputum production, shortness of breath and wheezing.   Cardiovascular:  Negative for chest pain and palpitations.  Skin:  Positive for itching and rash.  All other systems reviewed and are negative.       Objective:    BP (!) 106/58   Pulse 94   Temp 98.2 F (36.8 C)   Ht 5\' 9"  (1.753 m)   Wt 211 lb 9 oz (96 kg)   SpO2 98%   BMI 31.24 kg/m    Physical Exam Vitals and nursing note reviewed.  Constitutional:      General: She is not in acute distress.    Appearance: Normal appearance. She is normal weight. She is not toxic-appearing.  HENT:     Head: Normocephalic and atraumatic.     Comments: Mild periorbital swelling    Right Ear: External ear normal.     Left Ear: External ear normal.     Nose: Nose normal.     Mouth/Throat:     Mouth: Mucous membranes are moist.     Pharynx: Oropharynx is clear.  Eyes:     Conjunctiva/sclera:  Conjunctivae normal.  Cardiovascular:     Rate and Rhythm: Normal rate and regular rhythm.     Pulses: Normal pulses.     Heart sounds: Normal heart sounds.  Pulmonary:     Effort: Pulmonary effort is normal. No respiratory distress.     Breath sounds: Normal breath sounds. No wheezing or rales.  Skin:    Capillary Refill: Capillary refill takes less than 2 seconds.  Neurological:     General: No focal deficit present.     Mental Status: She is alert and oriented to person, place, and time. Mental status is at baseline.  Psychiatric:        Mood and Affect: Mood normal.        Behavior: Behavior normal.        Thought  Content: Thought content normal.        Judgment: Judgment normal.     No results found for any visits on 03/02/22.      Assessment & Plan:   Problem List Items Addressed This Visit   None Visit Diagnoses     Hypersensitivity reaction, sequela    -  Primary   Relevant Orders   Ambulatory referral to Allergy   Hypotension, unspecified hypotension type         Unsure of what is causing allergic reaction. Although it started after the Doxy and Hydodan, has been off of these for more than a week and still having itching which never went away despite treatments in between I.e prednisone, zyrtec, hydroxyzine, and tessalon perles that she has since stopped also. I suspect it's something pt taking chronically that's causing this allergic reaction? Blood pressure also noted to be low on all 3 occasions when seen on 02/23/22, 02/27/22 and today. Have advised pt to stop Valsartan/HCTZ for now due to hypotension and possible reaction since it's the newest medicine as of Feb 2023? Crestor also started during that time but will stop the BP medicine for now.  Stay on Amlodipine, Crestor, and zyrtec for now.  Stop all vitamins and increase fluid intake.  See back in 2 weeks for assessment until pt can go back to see PCP. I've also gone ahead and referred pt to allergist. Added Flonase to regimen with zyrtec.   No orders of the defined types were placed in this encounter.   Return in about 2 weeks (around 03/16/2022).  Leeanne Rio, MD

## 2022-03-02 NOTE — Patient Instructions (Signed)
Only take Amlodipine, Zyrtec and Crestor for now Bath with honey oatmeal bath Drink plenty of water

## 2022-03-05 ENCOUNTER — Ambulatory Visit (INDEPENDENT_AMBULATORY_CARE_PROVIDER_SITE_OTHER): Payer: Medicare Other | Admitting: Family Medicine

## 2022-03-05 ENCOUNTER — Encounter: Payer: Self-pay | Admitting: Family Medicine

## 2022-03-05 VITALS — BP 145/53 | HR 85 | Ht 69.0 in | Wt 211.0 lb

## 2022-03-05 DIAGNOSIS — L299 Pruritus, unspecified: Secondary | ICD-10-CM | POA: Diagnosis not present

## 2022-03-05 DIAGNOSIS — R21 Rash and other nonspecific skin eruption: Secondary | ICD-10-CM | POA: Diagnosis not present

## 2022-03-05 MED ORDER — METHYLPREDNISOLONE ACETATE 80 MG/ML IJ SUSP
80.0000 mg | Freq: Once | INTRAMUSCULAR | Status: AC
  Administered 2022-03-05: 80 mg via INTRAMUSCULAR

## 2022-03-05 MED ORDER — METHYLPREDNISOLONE SODIUM SUCC 125 MG IJ SOLR
125.0000 mg | Freq: Once | INTRAMUSCULAR | Status: AC
  Administered 2022-03-05: 125 mg via INTRAMUSCULAR

## 2022-03-05 NOTE — Progress Notes (Signed)
Established Patient Office Visit  Subjective   Patient ID: [name], female    DOB: 1945-08-06  Age: 77 y.o. MRN: [id number]  Chief Complaint  Patient presents with   Facial Swelling   Pruritis    HPI Facial swelling diffuse itching and rash-when I saw her on December 19 she had had sinus symptoms and persistent cough for about a month.  Chest x-ray was negative.  We decided to treat with doxycycline and hydrocodone cough syrup which she started on December 20.  She developed itching and some facial swelling and thought it was the cough syrup so continue the doxycycline..  She completed the doxycycline by December 26 and by the 29th she sought care.  She was given Zyrtec, and prednisone.  She was seen again on January 2 for persistent symptoms and had labs drawn.  Negative for COVID and flu.  Pressure was a little bit low.  She then saw another provider on January 5   Only new medications were the allopurinol that she had started in early December.  Her medications were stopped on Friday.  And she was told to stay on the amlodipine, Crestor and Zyrtec.  Also told to stop all supplements and vitamins.    ROS    Objective:     BP (!) 145/53   Pulse 85   Ht 5\' 9"  (1.753 m)   Wt 211 lb (95.7 kg)   SpO2 100%   BMI 31.16 kg/m    Physical Exam Vitals and nursing note reviewed.  Constitutional:      Appearance: She is well-developed.  HENT:     Head: Normocephalic and atraumatic.     Comments: Does have some facial swelling bilaterally especially around her cheeks and eyes. Eyes:     Conjunctiva/sclera: Conjunctivae normal.  Cardiovascular:     Rate and Rhythm: Normal rate and regular rhythm.     Heart sounds: Normal heart sounds.  Pulmonary:     Effort: Pulmonary effort is normal.     Breath sounds: Normal breath sounds.     Comments: Pretty persistent dry cough in the room today. Skin:    General: Skin is warm and dry.     Comments: And looks dry and thick and  inflamed particular on her neck, upper back, lower back, antecubital fossa bilaterally.  She has some erythematous areas on her lower legs as well.  Neurological:     Mental Status: She is alert and oriented to person, place, and time.  Psychiatric:        Mood and Affect: Mood normal.        Behavior: Behavior normal.      No results found for any visits on 03/05/22.    The 10-year ASCVD risk score (Arnett DK, et al., 2019) is: 29.3%    Assessment & Plan:   Problem List Items Addressed This Visit   None Visit Diagnoses     Itching    -  Primary   Rash and nonspecific skin eruption          Rash, itching, facial swelling-she has stopped all medications except for her amlodipine which she has been on for about 10 years.  In fact today she did not take any of her medicines.  I am going to have her continue to hold all of them.  Really encouraged her to make sure she is hydrated as recent labs show that she was mildly dehydrated.  Given 125 mg of Solu-Medrol and  80 mg of Depo-Medrol to try to provide relief.  We were able to get her in with an allergist tomorrow at 9:30 in the morning they said they would not be able to do testing for at least a week so thus chose to go ahead and give her a higher dose of steroids to try to give her some short-term relief.  She has been getting a little bit of short-term relief with her Aveeno bath.  No follow-ups on file.    Beatrice Lecher, MD

## 2022-03-05 NOTE — Progress Notes (Signed)
Pt was seen last week for facial swelling and itching. This hasn't gotten any better

## 2022-03-05 NOTE — Addendum Note (Signed)
Addended by: Teddy Spike on: 03/05/2022 12:10 PM   Modules accepted: Orders

## 2022-03-06 ENCOUNTER — Encounter: Payer: Self-pay | Admitting: Internal Medicine

## 2022-03-06 ENCOUNTER — Other Ambulatory Visit: Payer: Self-pay

## 2022-03-06 ENCOUNTER — Ambulatory Visit (INDEPENDENT_AMBULATORY_CARE_PROVIDER_SITE_OTHER): Payer: Medicare Other | Admitting: Internal Medicine

## 2022-03-06 VITALS — BP 138/60 | HR 82 | Temp 98.4°F | Resp 20 | Ht 68.5 in | Wt 217.3 lb

## 2022-03-06 DIAGNOSIS — D7212 Drug rash with eosinophilia and systemic symptoms syndrome: Secondary | ICD-10-CM

## 2022-03-06 DIAGNOSIS — Z889 Allergy status to unspecified drugs, medicaments and biological substances status: Secondary | ICD-10-CM

## 2022-03-06 DIAGNOSIS — T50905A Adverse effect of unspecified drugs, medicaments and biological substances, initial encounter: Secondary | ICD-10-CM | POA: Diagnosis not present

## 2022-03-06 MED ORDER — DESONIDE 0.05 % EX OINT: TOPICAL_OINTMENT | CUTANEOUS | 0 refills | Status: DC

## 2022-03-06 MED ORDER — BETAMETHASONE DIPROPIONATE AUG 0.05 % EX CREA: TOPICAL_CREAM | CUTANEOUS | 5 refills | Status: DC

## 2022-03-06 NOTE — Progress Notes (Signed)
NEW PATIENT  Date of Service/Encounter:  03/06/22  Consult requested by: Hali Marry, MD   Subjective:   [name] (DOB: Jul 23, 1945) is a 77 y.o. female who presents to the clinic on 03/06/2022 with a chief complaint of Establish Care, Rash, and Pruritus (Pt c/o of swelling on face, eyes and itching all over x 3 weeks. ) .    History obtained from: chart review and patient. She is not the best historian so some of the information is obtained from chart review.    Itching/Rash: Started on Allopurinol with prednisone 01/30/2022 for multiple gout flare ups.   Seen 02/13/2022 for cold and cough symptoms, started on doxycycline and Hycodan. Labs checked with ALT 72, AST 82,   Reports onset of itching and a red rash a few days later so she stopped the Hycodan on 12/24.     Seen 02/23/2022 for persistent itching despite stopping Hycodan and finishing Doxycycline.  Physical exam with diffuse macular erythematous rash on arms, chest, legs.  Started on Zyrtec for 2 weeks and Prednisone for 5 days.  Cough is better.   Seen 02/27/2022 for persistent itch and cough.  Started on [employer] and PRN hydroxyzine.   Labs checked.  PCP informed her to hydrate as BUN/Cr ratio was elevated.    Of note, labs showed AEC of 1385, Hgb of 11. CMP with sCr 1.28, BUN 31, ALT 40.  Normal Alk Phos. She had very mild CKD in the past.   AEC count 6 years ago was 100-300; unfortunately no other recent checks.    Seen 03/02/2022: persistent itching despite prednisone, zyrtec, hydroxyzine, tessalon perles.  Stopped valsartan/HCTZ as she had low BP in clinic and if this was a drug allergy.  Also stopped Allopurinol.  Referred to Allergy.  Only to continue amlodipine, crestor, zyrtec.  Seen 03/05/2022: persistent itching and rash, given solumedrol and depo medrol in clinic.  Told to stay off her medications.   Currently, patient reports she has stopped all of her medications.  Her BP in clinic today  was fine.  She does report still having an erythematous rash with flaking on chest, arms, legs.  She is moisturizing with Vaseline.  It is getting better.  She is still itching.  She also noted having facial swelling which has subsided significantly.  Her initial complaint of cough is also better.   Past Medical History: Past Medical History:  Diagnosis Date   Diverticulosis    Hiatal hernia    Hyperlipidemia    borderline   Hypertension    Spinal stenosis 2019   Past Surgical History: Past Surgical History:  Procedure Laterality Date   ABDOMINAL HYSTERECTOMY     TAH Pt. unsure if BSO   BREAST REDUCTION SURGERY  1994   BREAST SURGERY     Reduction   LIPOSUCTION     REDUCTION MAMMAPLASTY Bilateral 1994    Family History: Family History  Problem Relation Age of Onset   Breast cancer Sister 53       COD breast cancer and pneumonia   Hypertension Sister    Heart attack Father    Hypertension Father    Hypertension Mother    Kidney disease Mother    Breast cancer Other        ages 51 and 76--2 nieces   Hypertension Brother    Colon cancer Neg Hx    Esophageal cancer Neg Hx    Rectal cancer Neg Hx    Stomach cancer  Neg Hx    Social History: 77 year old house Wood floors in bedroom No pets No tobacco exposure Retired  Medication List:  Allergies as of 03/06/2022       Reactions   Allopurinol Other (See Comments)   DRESS   Penicillins Rash   REACTION: rash   Hycodan [hydrocodone Bit-homatrop Mbr] Itching        Medication List        Accurate as of March 06, 2022 12:05 PM. If you have any questions, ask your nurse or doctor.          allopurinol 300 MG tablet Commonly known as: ZYLOPRIM Take 1 tablet (300 mg total) by mouth daily.   amLODipine 10 MG tablet Commonly known as: NORVASC TAKE 1 TABLET BY MOUTH EVERY DAY   ascorbic acid 500 MG tablet Commonly known as: VITAMIN C Take 500 mg by mouth daily.   augmented betamethasone dipropionate  0.05 % cream Commonly known as: DIPROLENE-AF Apply twice daily below the neck for itching. Started by: Birder Robson, MD   Calcium Carbonate-Vitamin D 600-200 MG-UNIT Tabs Take by mouth.   Centrum Silver Adult 50+ Tabs Take 1 tablet by mouth daily.   cetirizine 10 MG tablet Commonly known as: ZYRTEC Take 1 tablet (10 mg total) by mouth daily.   colchicine 0.6 MG tablet Take 1 tablet (0.6 mg total) by mouth daily as needed. Can take up to 1 tablet twice a day during acute gout flares   desonide 0.05 % ointment Commonly known as: DESOWEN Apply twice daily above the neck for itching. Started by: Birder Robson, MD   Fish Oil 1200 MG Caps Take by mouth.   fluticasone 50 MCG/ACT nasal spray Commonly known as: FLONASE Place 1 spray into both nostrils daily.   HYDROCODONE-HOMATROPINE PO Take by mouth.   hydrOXYzine 25 MG tablet Commonly known as: ATARAX Take 25 mg by mouth 3 (three) times daily as needed.   IRON 27 PO Take 27 mg by mouth daily.   magnesium oxide 400 MG tablet Commonly known as: MAG-OX Take 400 mg by mouth daily.   METAMUCIL PO Take by mouth.   rosuvastatin 10 MG tablet Commonly known as: Crestor Take 1 tablet (10 mg total) by mouth at bedtime.   TURMERIC CURCUMIN PO Take 1,000 mg by mouth daily.   valsartan-hydrochlorothiazide 320-25 MG tablet Commonly known as: DIOVAN-HCT Take 1 tablet by mouth daily.   VITAMIN D PO Take 1 tablet by mouth daily.   Vitamin E 90 MG (200 UNIT) Caps Take 1 tablet by mouth daily.         REVIEW OF SYSTEMS: Pertinent positives and negatives discussed in HPI.   Objective:   Physical Exam: BP 138/60 (BP Location: Right Arm, Patient Position: Sitting, Cuff Size: Normal)   Pulse 82   Temp 98.4 F (36.9 C) (Temporal)   Resp 20   Ht 5' 8.5" (1.74 m)   Wt 217 lb 4.8 oz (98.6 kg)   SpO2 100%   BMI 32.56 kg/m  Body mass index is 32.56 kg/m. GEN: alert, well developed HEENT: clear conjunctiva, TM grey  and translucent, nose with + inferior turbinate hypertrophy, pink nasal mucosa, no rhinorrhea, no cobblestoning HEART: regular rate and rhythm, no murmur LUNGS: clear to auscultation bilaterally, no coughing, unlabored respiration ABDOMEN: soft, non distended  SKIN: erythematous macular rash to bl arms and chest with some skin flaking.  No open lesions. Slight edema below eyes.    Reviewed:  Multiple notes from different providers in HPI    Assessment:   1. DRESS syndrome     Plan/Recommendations:   DRESS Syndrome - In setting of rash, facial swelling, AEC 1300 with mild renal/hepatic dysfunction, I am concerned about DRESS.  I will recheck CBC with diff and CMP today to evaluate AEC and renal/liver function.  Her rash is improving and compared to 12/19 labs, the 1/2 labs show improvement in renal/liver function.  Will try high dose topical steroids but if systemic dysfunction noted on labs, will need to do oral prednisone.   - Stay off Allopurinol as this is the most common cause for DRESS and fits the timeline as the reaction occurred 2-3 weeks after. I have added this to your allergy list.  - I do not think her anti-HTN or Crestor caused this. Hycodan can cause itching as it is an opiate and can result in direct mast cell degranulation but this is not commonly associated with DRESS. In terms of the doxycycline, there have been case reports of it causing DRESS but the timeline does not fit as DRESS usually occurs 2-8 weeks after starting the medication.  - Use a gentle, unscented cleanser at the end of the shower (such as Dove unscented bar or baby wash, or Aveeno sensitive body wash). Then rinse, pat half-way dry, and apply a gentle, unscented moisturizer cream or ointment (Cerave with Vaseline) all over while still damp. Dry skin makes the itching and rash of eczema worse. The skin should be moisturized with a gentle, unscented moisturizer at least twice daily.  - Use only unscented liquid  laundry detergent. - Apply prescribed topical steroid (betamethasone below neck or desonide above neck) to flared areas twice daily. You can also mix the steroid ointment with the Cerave cream to help spread it.   - If you notice worsening skin symptoms, you need to go to the ER for further evaluation and treatment.  I did discuss that in some patients this can manifest with severe complications.  Since her rash is improving, I think we will likely be able to avoid this.     Return in about 1 week (around 03/13/2022).  Alesia Morin, MD [facility]

## 2022-03-06 NOTE — Patient Instructions (Addendum)
Drug Reaction - Stay off Allopurinol. I have added this to your allergy list. - Will obtain bloodwork to look at your renal and liver function and eosinophil count.  - Do a daily soaking tub bath in warm water for 10-15 minutes.  - Use a gentle, unscented cleanser at the end of the bath (such as Dove unscented bar or baby wash, or Aveeno sensitive body wash). Then rinse, pat half-way dry, and apply a gentle, unscented moisturizer cream or ointment (Cerave with Vaseline) all over while still damp. Dry skin makes the itching and rash of eczema worse. The skin should be moisturized with a gentle, unscented moisturizer at least twice daily.  - Use only unscented liquid laundry detergent. - Apply prescribed topical steroid (betamethasone below neck or desonide above neck) to flared areas twice daily. You can also mix the steroid ointment with the Cerave cream to help spread it.   - If you notice worsening skin symptoms, you need to go to the ER for further evaluation and treatment.

## 2022-03-07 ENCOUNTER — Other Ambulatory Visit (HOSPITAL_COMMUNITY): Payer: Medicare Other

## 2022-03-07 ENCOUNTER — Ambulatory Visit: Payer: Medicare Other | Admitting: Internal Medicine

## 2022-03-07 LAB — CMP14+EGFR
ALT: 35 IU/L — ABNORMAL HIGH (ref 0–32)
AST: 29 IU/L (ref 0–40)
Albumin/Globulin Ratio: 1.9 (ref 1.2–2.2)
Albumin: 4.2 g/dL (ref 3.8–4.8)
Alkaline Phosphatase: 79 IU/L (ref 44–121)
BUN/Creatinine Ratio: 30 — ABNORMAL HIGH (ref 12–28)
BUN: 30 mg/dL — ABNORMAL HIGH (ref 8–27)
Bilirubin Total: 0.3 mg/dL (ref 0.0–1.2)
CO2: 23 mmol/L (ref 20–29)
Calcium: 9.5 mg/dL (ref 8.7–10.3)
Chloride: 102 mmol/L (ref 96–106)
Creatinine, Ser: 1 mg/dL (ref 0.57–1.00)
Globulin, Total: 2.2 g/dL (ref 1.5–4.5)
Glucose: 124 mg/dL — ABNORMAL HIGH (ref 70–99)
Potassium: 4.1 mmol/L (ref 3.5–5.2)
Sodium: 139 mmol/L (ref 134–144)
Total Protein: 6.4 g/dL (ref 6.0–8.5)
eGFR: 58 mL/min/{1.73_m2} — ABNORMAL LOW (ref >59)

## 2022-03-07 LAB — CBC WITH DIFFERENTIAL/PLATELET
Basophils Absolute: 0 10*3/uL (ref 0.0–0.2)
Basos: 0 %
EOS (ABSOLUTE): 0.1 10*3/uL (ref 0.0–0.4)
Eos: 1 %
Hematocrit: 28.5 % — ABNORMAL LOW (ref 34.0–46.6)
Hemoglobin: 9 g/dL — ABNORMAL LOW (ref 11.1–15.9)
Immature Grans (Abs): 0.1 10*3/uL (ref 0.0–0.1)
Immature Granulocytes: 1 %
Lymphocytes Absolute: 1.5 10*3/uL (ref 0.7–3.1)
Lymphs: 21 %
MCH: 27.6 pg (ref 26.6–33.0)
MCHC: 31.6 g/dL (ref 31.5–35.7)
MCV: 87 fL (ref 79–97)
Monocytes Absolute: 1.1 10*3/uL — ABNORMAL HIGH (ref 0.1–0.9)
Monocytes: 14 %
Neutrophils Absolute: 4.6 10*3/uL (ref 1.4–7.0)
Neutrophils: 63 %
Platelets: 271 10*3/uL (ref 150–450)
RBC: 3.26 x10E6/uL — ABNORMAL LOW (ref 3.77–5.28)
RDW: 16.2 % — ABNORMAL HIGH (ref 11.7–15.4)
WBC: 7.4 10*3/uL (ref 3.4–10.8)

## 2022-03-08 ENCOUNTER — Encounter: Payer: Self-pay | Admitting: Family Medicine

## 2022-03-08 ENCOUNTER — Ambulatory Visit (INDEPENDENT_AMBULATORY_CARE_PROVIDER_SITE_OTHER): Payer: Medicare Other | Admitting: Family Medicine

## 2022-03-08 VITALS — BP 140/76 | HR 77 | Wt 217.0 lb

## 2022-03-08 DIAGNOSIS — M7989 Other specified soft tissue disorders: Secondary | ICD-10-CM | POA: Diagnosis not present

## 2022-03-08 DIAGNOSIS — E1169 Type 2 diabetes mellitus with other specified complication: Secondary | ICD-10-CM

## 2022-03-08 DIAGNOSIS — I10 Essential (primary) hypertension: Secondary | ICD-10-CM

## 2022-03-08 DIAGNOSIS — T50905A Adverse effect of unspecified drugs, medicaments and biological substances, initial encounter: Secondary | ICD-10-CM

## 2022-03-08 DIAGNOSIS — D7212 Drug rash with eosinophilia and systemic symptoms syndrome: Secondary | ICD-10-CM

## 2022-03-08 LAB — POCT UA - MICROALBUMIN
Creatinine, POC: 50 mg/dL
Microalbumin Ur, POC: 30 mg/L

## 2022-03-08 NOTE — Progress Notes (Signed)
Established Patient Office Visit  Subjective   Patient ID: [name], female    DOB: 30-Mar-1945  Age: 77 y.o. MRN: [id number]  Chief Complaint  Patient presents with   Follow-up         HPI F/U swelling in Face/Rash -she did follow-up with the allergist who based on the course of her reaction and triggering events felt like she was most likely having DRESS syndrome from her allopurinol which was recently a new start medication.  Allopurinol has been added to the intolerance list.  Repeat labs showed serum creatinine was back down to 1.0 it had jumped up significantly during the initial course.  She is feeling better overall but still has a persistent cough.  Still having Rashi itchy skin.  She also wanted me to look at her right foot and ankle.  It has been swollen since she stopped her blood pressure medications.     ROS    Objective:     BP (!) 140/76   Pulse 77   Wt 217 lb (98.4 kg)   SpO2 100%   BMI 32.51 kg/m     Physical Exam Vitals and nursing note reviewed.  Constitutional:      Appearance: She is well-developed.  HENT:     Head: Normocephalic and atraumatic.  Cardiovascular:     Rate and Rhythm: Normal rate and regular rhythm.     Heart sounds: Normal heart sounds.  Pulmonary:     Effort: Pulmonary effort is normal.     Breath sounds: Normal breath sounds.  Skin:    General: Skin is warm and dry.  Neurological:     Mental Status: She is alert and oriented to person, place, and time.  Psychiatric:        Behavior: Behavior normal.      Results for orders placed or performed in visit on 03/08/22  POCT UA - Microalbumin  Result Value Ref Range   Microalbumin Ur, POC 30 mg/L   Creatinine, POC 50 mg/dL   Albumin/Creatinine Ratio, Urine, POC 30-300        The 10-year ASCVD risk score (Arnett DK, et al., 2019) is: 28.1%    Assessment & Plan:   Problem List Items Addressed This Visit       Cardiovascular and Mediastinum    HYPERTENSION, BENIGN    For restart valsartan HCTZ.  Follow-up in 2 weeks for blood pressure check.  If she is doing well and blood pressure still mildly elevated we will add back the amlodipine.      Relevant Orders   POCT UA - Microalbumin (Completed)     Endocrine   Diabetes type 2, controlled ([place])   Relevant Orders   POCT UA - Microalbumin (Completed)     Musculoskeletal and Integument   DRESS syndrome - Primary    He is improving after recent steroid injections.  Continue with as needed topical treatments.  This should hopefully resolve on its own within weeks to months.  She does have a follow-up with the allergy specialist next Tuesday.  They can also check her blood pressure there again.      Other Visit Diagnoses     Swelling of right foot           Right foot swelling-.  Will go ahead and restart blood pressure medication which does have a diuretic in it.  If it is not improving over the next couple days then please let me know.  She did  want me to know that she has pushed out the echocardiogram because of so much going on and appointments.  Return in about 2 weeks (around 03/22/2022) for Nurse visit for BP check .    Beatrice Lecher, MD

## 2022-03-08 NOTE — Assessment & Plan Note (Signed)
He is improving after recent steroid injections.  Continue with as needed topical treatments.  This should hopefully resolve on its own within weeks to months.  She does have a follow-up with the allergy specialist next Tuesday.  They can also check her blood pressure there again.

## 2022-03-08 NOTE — Progress Notes (Signed)
She was seen by the allergist and was given 2 prescriptions to use for her rash  Pt has bilateral swelling in lower extremities. She has been sleeping with her feet elevated.

## 2022-03-08 NOTE — Patient Instructions (Signed)
Please restart your valsartan HCTZ. Okay to restart your rosuvastatin at night. If you are feeling better in the next couple of weeks then you can start adding back your over-the-counter supplements one by one.

## 2022-03-08 NOTE — Assessment & Plan Note (Signed)
For restart valsartan HCTZ.  Follow-up in 2 weeks for blood pressure check.  If she is doing well and blood pressure still mildly elevated we will add back the amlodipine.

## 2022-03-10 ENCOUNTER — Other Ambulatory Visit: Payer: Self-pay | Admitting: Family Medicine

## 2022-03-10 DIAGNOSIS — I10 Essential (primary) hypertension: Secondary | ICD-10-CM

## 2022-03-13 ENCOUNTER — Other Ambulatory Visit: Payer: Self-pay

## 2022-03-13 ENCOUNTER — Ambulatory Visit (INDEPENDENT_AMBULATORY_CARE_PROVIDER_SITE_OTHER): Payer: Medicare Other | Admitting: Internal Medicine

## 2022-03-13 ENCOUNTER — Encounter: Payer: Self-pay | Admitting: Internal Medicine

## 2022-03-13 VITALS — BP 144/56 | HR 62 | Temp 98.1°F | Resp 16

## 2022-03-13 DIAGNOSIS — R058 Other specified cough: Secondary | ICD-10-CM

## 2022-03-13 DIAGNOSIS — D7212 Drug rash with eosinophilia and systemic symptoms syndrome: Secondary | ICD-10-CM | POA: Diagnosis not present

## 2022-03-13 DIAGNOSIS — T50905A Adverse effect of unspecified drugs, medicaments and biological substances, initial encounter: Secondary | ICD-10-CM

## 2022-03-13 DIAGNOSIS — I10 Essential (primary) hypertension: Secondary | ICD-10-CM | POA: Diagnosis not present

## 2022-03-13 DIAGNOSIS — D6489 Other specified anemias: Secondary | ICD-10-CM

## 2022-03-13 NOTE — Progress Notes (Signed)
FOLLOW UP Date of Service/Encounter:  03/13/22   Subjective:  [name] (DOB: 08-Nov-1945) is a 77 y.o. 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   History obtained from: chart review and patient.  Last visit was with me 03/06/2022 for DRESS syndrome with allopurinol.  Noted to have facial swelling, rashes, mild elevated of LFT and sCr.  Recheck labs show improvement in renal and liver function but her chronic anemia was worse with Hgb from 11 to 9.  She denied any GI or GU bleeds.    Since last visit, she reports she is doing better.  Her skin is less itchy and not peeling.  She does have some hypopigmented scars from where the skin was irritated.  She has noted some slight puffiness under her eyes but no other diffuse facial swelling.  Her fatigue has improved. Using betamethasone PRN.    She also reports that her cough is getting better.  She is using honey which helps.  The cough started after cold symptoms.  She does not have chronic rhinitis symptoms or asthma symptoms.   Her PCP did see her recently and restarted her valsartan/HCTZ. She does not have history of CVA or heart disease. Her BP today was not terrible and discussed with her that there are different guidelines for goal BP in elderly depending on which guidelines you refer to.  Past Medical History: Past Medical History:  Diagnosis Date   Diverticulosis    Hiatal hernia    Hyperlipidemia    borderline   Hypertension    Spinal stenosis 2019    Objective:  BP (!) 144/56   Pulse 62   Temp 98.1 F (36.7 C) (Temporal)   Resp 16   SpO2 99%  There is no height or weight on file to calculate BMI. Physical Exam: GEN: alert, well developed HEENT: clear conjunctiva, TM grey and translucent, nose with mild inferior turbinate hypertrophy, pink nasal mucosa, no rhinorrhea HEART: regular rate and rhythm, no murmur LUNGS:  clear to auscultation bilaterally, no coughing, unlabored respiration SKIN: hypopigmented areas noted on bl arms and neck, no erythema or peeling   Assessment/Plan  Drug Reaction - Stay off Allopurinol. I have added this to your allergy list. - Will obtain bloodwork today to recheck your counts and renal/liver function. Your symptoms are improving and will take several weeks/months for it to go back to baseline.  - Do a daily soaking tub bath in warm water for 10-15 minutes.  - Use a gentle, unscented cleanser at the end of the bath (such as Dove unscented bar or baby wash, or Aveeno sensitive body wash). Then rinse, pat half-way dry, and apply a gentle, unscented moisturizer cream or ointment (Cerave with Vaseline) all over while still damp. Dry skin makes the itching and rash of eczema worse. The skin should be moisturized with a gentle, unscented moisturizer at least twice daily.  - Use only unscented liquid laundry detergent. - Apply prescribed topical steroid (betamethasone below neck or desonide above neck) to flared areas twice daily. You can also mix the steroid ointment with the Cerave cream to help spread it.  Give your skin a break after using the steroid cream for 7 days in a row.   If you are not itching, you can hold the steroid cream.  Post Viral Cough - Recommend symptomatic care with lozenges and honey and warm team.  HTN - Follow up with PCP. BP today was okay, 144/56.  This would be controlled based on ACP/AAFP guidelines but slightly high for AAC/AHA.  She denies any history of CVA or heart attacks.      Return in about 2 weeks (around 03/27/2022). Harlon Flor, MD  [facility]

## 2022-03-13 NOTE — Patient Instructions (Addendum)
Drug Reaction - Stay off Allopurinol. I have added this to your allergy list. - Will obtain bloodwork today to recheck your counts and renal/liver function.  - Do a daily soaking tub bath in warm water for 10-15 minutes.  - Use a gentle, unscented cleanser at the end of the bath (such as Dove unscented bar or baby wash, or Aveeno sensitive body wash). Then rinse, pat half-way dry, and apply a gentle, unscented moisturizer cream or ointment (Cerave with Vaseline) all over while still damp. Dry skin makes the itching and rash of eczema worse. The skin should be moisturized with a gentle, unscented moisturizer at least twice daily.  - Use only unscented liquid laundry detergent. - Apply prescribed topical steroid (betamethasone below neck or desonide above neck) to flared areas twice daily. You can also mix the steroid ointment with the Cerave cream to help spread it.  Give your skin a break after using the steroid cream for 7 days in a row.   If you are not itching, you can hold the steroid cream.  Post Viral Cough - Recommend symptomatic care with lozenges and honey and warm team.

## 2022-03-14 ENCOUNTER — Telehealth: Payer: Self-pay | Admitting: Internal Medicine

## 2022-03-14 LAB — CBC WITH DIFFERENTIAL/PLATELET
Basophils Absolute: 0.1 10*3/uL (ref 0.0–0.2)
Basos: 1 %
EOS (ABSOLUTE): 3.8 10*3/uL — ABNORMAL HIGH (ref 0.0–0.4)
Eos: 41 %
Hematocrit: 30 % — ABNORMAL LOW (ref 34.0–46.6)
Hemoglobin: 9.5 g/dL — ABNORMAL LOW (ref 11.1–15.9)
Immature Grans (Abs): 0 10*3/uL (ref 0.0–0.1)
Immature Granulocytes: 0 %
Lymphocytes Absolute: 1.6 10*3/uL (ref 0.7–3.1)
Lymphs: 16 %
MCH: 28.3 pg (ref 26.6–33.0)
MCHC: 31.7 g/dL (ref 31.5–35.7)
MCV: 89 fL (ref 79–97)
Monocytes Absolute: 0.9 10*3/uL (ref 0.1–0.9)
Monocytes: 9 %
Neutrophils Absolute: 3.1 10*3/uL (ref 1.4–7.0)
Neutrophils: 33 %
Platelets: 286 10*3/uL (ref 150–450)
RBC: 3.36 x10E6/uL — ABNORMAL LOW (ref 3.77–5.28)
RDW: 18 % — ABNORMAL HIGH (ref 11.7–15.4)
WBC: 9.5 10*3/uL (ref 3.4–10.8)

## 2022-03-14 LAB — CMP14+EGFR
ALT: 29 IU/L (ref 0–32)
AST: 28 IU/L (ref 0–40)
Albumin/Globulin Ratio: 1.8 (ref 1.2–2.2)
Albumin: 4.2 g/dL (ref 3.8–4.8)
Alkaline Phosphatase: 80 IU/L (ref 44–121)
BUN/Creatinine Ratio: 12 (ref 12–28)
BUN: 11 mg/dL (ref 8–27)
Bilirubin Total: 0.4 mg/dL (ref 0.0–1.2)
CO2: 26 mmol/L (ref 20–29)
Calcium: 9.3 mg/dL (ref 8.7–10.3)
Chloride: 102 mmol/L (ref 96–106)
Creatinine, Ser: 0.94 mg/dL (ref 0.57–1.00)
Globulin, Total: 2.4 g/dL (ref 1.5–4.5)
Glucose: 88 mg/dL (ref 70–99)
Potassium: 4 mmol/L (ref 3.5–5.2)
Sodium: 142 mmol/L (ref 134–144)
Total Protein: 6.6 g/dL (ref 6.0–8.5)
eGFR: 63 mL/min/{1.73_m2} (ref >59)

## 2022-03-14 NOTE — Telephone Encounter (Signed)
Patient is requesting a call back from Dr. Posey Pronto. Patient states her eyes are more swollen today and she would like to discuss one of the medications Dr. Posey Pronto prescribed for her.   Best contact number: [phone]

## 2022-03-15 ENCOUNTER — Encounter: Payer: Self-pay | Admitting: Internal Medicine

## 2022-03-15 ENCOUNTER — Telehealth: Payer: Self-pay | Admitting: *Deleted

## 2022-03-15 MED ORDER — PREDNISONE 10 MG PO TABS: 40.0000 mg | ORAL_TABLET | Freq: Every day | ORAL | 0 refills | Status: AC

## 2022-03-15 NOTE — Telephone Encounter (Signed)
Error

## 2022-03-15 NOTE — Telephone Encounter (Signed)
I have attempted calling this patient multiple times last evening and this morning and left messages for her to please pick up but still unable to reach her.  I have informed my staff to please try again later this afternoon.  Her renal/liver function are back to baseline but her eosinophil count has shot up to 3000 with anemia.  DRESS can sometimes cause anemia.  I want her to start Prednisone 40mg  daily and see her back for repeat lab check on Tuesday 1/23.  Uncontrolled eosinophils can cause significant organ damage. I am giving her extra pills as we will need to wean or continue same dose for longer until [place] returns to normal.

## 2022-03-15 NOTE — Telephone Encounter (Signed)
Called and spoke with the patient, she stated that she spoke with Dr. Posey Pronto this morning and has scheduled for a follow up with her next Tuesday. Patient is aware that the prednisone has been sent in and she is going to pick it up shortly.

## 2022-03-15 NOTE — Telephone Encounter (Signed)
Patient has been scheduled for next Tuesday and plans to go pick up the prednisone shortly.

## 2022-03-15 NOTE — Telephone Encounter (Signed)
-----  Message from Larose Kells, MD sent at 03/15/2022 10:24 AM EST ----- Regarding: Results Hello,  Can someone please help relay this message?  I have tried calling her multiple times last evening and this morning and left messages for her to pick up but still can't get in touch with her.    Her eosinophil count has shot up again and with her facial swelling, I want her to start oral prednisone so we can calm down the drug reaction.  Her liver and renal function are back to normal.  But we can't let this eosinophil count stay this high because it can cause damage to the organs over time so we need to use prednisone to bring it down.   Also I want to see her next week on Tuesday 1/23 to recheck labs and see how she is doing.  She will start prednisone 40mg  daily and then depending on her labs on Tuesday I will either continue it or change it.    If she has a lot of questions, then set her up for a telehealth appointment tomorrow. But still have her start the Prednisone today ASAP!  Thanks

## 2022-03-20 ENCOUNTER — Ambulatory Visit (INDEPENDENT_AMBULATORY_CARE_PROVIDER_SITE_OTHER): Payer: Medicare Other | Admitting: Family Medicine

## 2022-03-20 ENCOUNTER — Encounter: Payer: Self-pay | Admitting: Family Medicine

## 2022-03-20 ENCOUNTER — Ambulatory Visit (INDEPENDENT_AMBULATORY_CARE_PROVIDER_SITE_OTHER): Payer: Medicare Other | Admitting: Internal Medicine

## 2022-03-20 ENCOUNTER — Other Ambulatory Visit: Payer: Self-pay

## 2022-03-20 ENCOUNTER — Encounter: Payer: Self-pay | Admitting: Internal Medicine

## 2022-03-20 VITALS — BP 190/70 | HR 61 | Temp 98.5°F | Resp 24 | Wt 202.1 lb

## 2022-03-20 VITALS — BP 181/73 | HR 66 | Ht 68.5 in | Wt 201.0 lb

## 2022-03-20 DIAGNOSIS — D7212 Drug rash with eosinophilia and systemic symptoms syndrome: Secondary | ICD-10-CM

## 2022-03-20 DIAGNOSIS — I10 Essential (primary) hypertension: Secondary | ICD-10-CM | POA: Diagnosis not present

## 2022-03-20 DIAGNOSIS — E1169 Type 2 diabetes mellitus with other specified complication: Secondary | ICD-10-CM

## 2022-03-20 DIAGNOSIS — D6489 Other specified anemias: Secondary | ICD-10-CM

## 2022-03-20 DIAGNOSIS — T50905A Adverse effect of unspecified drugs, medicaments and biological substances, initial encounter: Secondary | ICD-10-CM | POA: Diagnosis not present

## 2022-03-20 DIAGNOSIS — Z889 Allergy status to unspecified drugs, medicaments and biological substances status: Secondary | ICD-10-CM | POA: Diagnosis not present

## 2022-03-20 NOTE — Progress Notes (Signed)
   Established Patient Office Visit  Subjective   Patient ID: PRITI CONSOLI, female    DOB: 1945-05-19  Age: 77 y.o. MRN: [id number]  Chief Complaint  Patient presents with   Follow-up         HPI   Swelling is gone.  Itching is better. She is back on prednisone.  Applying cereve, vaseline and cocoa butter.  Has some hypo and hyperpigmentation around her neck and forearms.    Has a follow-up with the allergist later today.  They are can do some additional blood work.  Her blood pressure has definitely been going back up she is on the valsartan.  He does not feel like she has a reliable blood pressure cuff at home.  She would like to know when she can restart her supplements.     ROS    Objective:     BP (!) 181/73   Pulse 66   Ht 5' 8.5" (1.74 m)   Wt 201 lb (91.2 kg)   SpO2 97%   BMI 30.12 kg/m    Physical Exam Vitals and nursing note reviewed.  Constitutional:      Appearance: She is well-developed.  HENT:     Head: Normocephalic and atraumatic.  Eyes:     Conjunctiva/sclera: Conjunctivae normal.  Cardiovascular:     Rate and Rhythm: Normal rate and regular rhythm.     Heart sounds: Normal heart sounds.  Pulmonary:     Effort: Pulmonary effort is normal.     Breath sounds: Normal breath sounds.  Skin:    General: Skin is warm and dry.     Coloration: Skin is not pale.  Neurological:     Mental Status: She is alert and oriented to person, place, and time.  Psychiatric:        Behavior: Behavior normal.      No results found for any visits on 03/20/22.    The 10-year ASCVD risk score (Arnett DK, et al., 2019) is: 37.9%    Assessment & Plan:   Problem List Items Addressed This Visit       Cardiovascular and Mediastinum   HYPERTENSION, BENIGN - Primary    Start amlodipine.  Continue valsartan HCT.  She is welcome to bring in her blood pressure cuff at next office visit to check the accuracy of the machine.  She might also want to put  fresh batteries in it.        Endocrine   Diabetes type 2, controlled ([place])    In 2 months for next A1c.  He is actually lost a fair amount of weight.  That should hopefully help with her glucose numbers though she has been on prednisone        Musculoskeletal and Integument   DRESS syndrome    Follow-up with allergist later today she is currently on 10 mg of prednisone.  Hopefully they will be able to taper off of this soon.  After couple days of adding back the amlodipine if she is doing well then can slowly add back her supplements 1 at a time every couple of days.  I did encourage her to discontinue her vitamin E.       Return in about 7 weeks (around 05/07/2022) for Diabetes follow-up.    Beatrice Lecher, MD

## 2022-03-20 NOTE — Progress Notes (Signed)
FOLLOW UP Date of Service/Encounter:  03/20/22   Subjective:  [name] (DOB: 25-Mar-1945) is a 77 y.o. female who returns to the [facility] on 03/20/2022 for follow up for DRESS.   History obtained from: chart review and patient.  Last seen 03/13/2022 for DRESS-allopurinol with worsening absolute eosinophil count. Informed to start prednisone 40mg  daily. Her renal and liver function are back to baseline though.    Reports doing well since last visit.  Her itching is gone and has not had any facial swelling with starting the prednisone.  Skin is doing better too; still has the hypopigmentation.  Using Cerave and Vaseline to moisturize. Taking valsartan-hctz and adding amlodipine today due to uncontrolled blood pressure.   Past Medical History: Past Medical History:  Diagnosis Date   Diverticulosis    Hiatal hernia    Hyperlipidemia    borderline   Hypertension    Spinal stenosis 2019    Objective:  BP (!) 190/70   Pulse 61   Temp 98.5 F (36.9 C) (Temporal)   Resp (!) 24   Wt 202 lb 1.6 oz (91.7 kg)   SpO2 98%   BMI 30.28 kg/m  Body mass index is 30.28 kg/m. 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 Will obtain bloodwork today to recheck your counts. Renal and liver function are back to baseline.  Your symptoms are improving and will take several weeks/months for it to go  back to baseline.  There is medical necessity for frequent follow ups and labwork with this condition as DRESS can result in life threatening severe drug reaction if not controlled.   - Continue Prednisone 40mg  daily. Once we get your labwork back, we will let you know what to do with the prednisone dose.   - Stay off Allopurinol. I have added this to your allergy list. - Do a daily soaking tub bath in warm water for 10-15 minutes.  - Use a gentle, unscented cleanser at the end of the bath (such as Dove unscented bar or baby wash, or Aveeno sensitive body wash). Then rinse, pat half-way dry, and apply a gentle, unscented moisturizer cream or ointment (Cerave with Vaseline) all over while still damp. Dry skin makes the itching and rash of eczema worse. The skin should be moisturized with a gentle, unscented moisturizer at least twice daily.  - Use only unscented liquid laundry detergent. - Hold off on starting any new vitamins/supplements at this time.  Once your reaction is completely cleared, you can restart them.    Essential HTN - Noted to have elevated BP in clinic today.  Already on valsartan-hctz.  PCP has restarted Amlodipine.  These were all chronic anti hypertensives that she was on for years prior to the reaction so I do not think they were the cause for the drug reaction.  - Discussed that prednisone can also raise blood pressure but unfortunately it is necessary right now to treat the drug reaction.     No  follow-ups on file. Keep follow up in 1 week.  Harlon Flor, MD  [facility]

## 2022-03-20 NOTE — Assessment & Plan Note (Addendum)
Follow-up with allergist later today she is currently on 10 mg of prednisone.  Hopefully they will be able to taper off of this soon.  After couple days of adding back the amlodipine if she is doing well then can slowly add back her supplements 1 at a time every couple of days.  I did encourage her to discontinue her vitamin E.

## 2022-03-20 NOTE — Patient Instructions (Addendum)
Drug Reaction - Will obtain bloodwork today to recheck your counts.  - Continue Prednisone 40mg  daily. Once we get your labwork back, we will let you know what to do with the prednisone dose.   - Stay off Allopurinol. I have added this to your allergy list. - Do a daily soaking tub bath in warm water for 10-15 minutes.  - Use a gentle, unscented cleanser at the end of the bath (such as Dove unscented bar or baby wash, or Aveeno sensitive body wash). Then rinse, pat half-way dry, and apply a gentle, unscented moisturizer cream or ointment (Cerave with Vaseline) all over while still damp. Dry skin makes the itching and rash of eczema worse. The skin should be moisturized with a gentle, unscented moisturizer at least twice daily.  - Use only unscented liquid laundry detergent. - Hold off on starting any new vitamins/supplements at this time.  Once your reaction is completely cleared, you can restart them.

## 2022-03-20 NOTE — Patient Instructions (Signed)
Please restart amlodipine.    

## 2022-03-20 NOTE — Assessment & Plan Note (Signed)
In 2 months for next A1c.  He is actually lost a fair amount of weight.  That should hopefully help with her glucose numbers though she has been on prednisone

## 2022-03-20 NOTE — Assessment & Plan Note (Signed)
Start amlodipine.  Continue valsartan HCT.  She is welcome to bring in her blood pressure cuff at next office visit to check the accuracy of the machine.  She might also want to put fresh batteries in it.

## 2022-03-21 LAB — CBC WITH DIFFERENTIAL/PLATELET
Basophils Absolute: 0 10*3/uL (ref 0.0–0.2)
Basos: 1 %
EOS (ABSOLUTE): 0 10*3/uL (ref 0.0–0.4)
Eos: 0 %
Hematocrit: 34.7 % (ref 34.0–46.6)
Hemoglobin: 11.1 g/dL (ref 11.1–15.9)
Immature Grans (Abs): 0 10*3/uL (ref 0.0–0.1)
Immature Granulocytes: 0 %
Lymphocytes Absolute: 0.8 10*3/uL (ref 0.7–3.1)
Lymphs: 12 %
MCH: 28.2 pg (ref 26.6–33.0)
MCHC: 32 g/dL (ref 31.5–35.7)
MCV: 88 fL (ref 79–97)
Monocytes Absolute: 0.3 10*3/uL (ref 0.1–0.9)
Monocytes: 5 %
Neutrophils Absolute: 5.4 10*3/uL (ref 1.4–7.0)
Neutrophils: 82 %
Platelets: 270 10*3/uL (ref 150–450)
RBC: 3.93 x10E6/uL (ref 3.77–5.28)
RDW: 17.6 % — ABNORMAL HIGH (ref 11.7–15.4)
WBC: 6.6 10*3/uL (ref 3.4–10.8)

## 2022-03-22 ENCOUNTER — Ambulatory Visit: Payer: Medicare Other

## 2022-03-22 ENCOUNTER — Ambulatory Visit
Admission: RE | Admit: 2022-03-22 | Discharge: 2022-03-22 | Disposition: A | Discharge status: Home / Self Care | Payer: Medicare Other | Source: Ambulatory Visit | Attending: Family Medicine | Admitting: Family Medicine

## 2022-03-22 DIAGNOSIS — Z1231 Encounter for screening mammogram for malignant neoplasm of breast: Secondary | ICD-10-CM

## 2022-03-26 NOTE — Progress Notes (Signed)
Please call patient. Normal mammogram.  Repeat in 1 year.  

## 2022-03-27 ENCOUNTER — Other Ambulatory Visit: Payer: Self-pay

## 2022-03-27 ENCOUNTER — Ambulatory Visit (INDEPENDENT_AMBULATORY_CARE_PROVIDER_SITE_OTHER): Payer: Medicare Other | Admitting: Internal Medicine

## 2022-03-27 ENCOUNTER — Encounter: Payer: Self-pay | Admitting: Internal Medicine

## 2022-03-27 VITALS — BP 140/82 | HR 72 | Temp 97.7°F | Ht 69.0 in | Wt 197.0 lb

## 2022-03-27 DIAGNOSIS — Z889 Allergy status to unspecified drugs, medicaments and biological substances status: Secondary | ICD-10-CM | POA: Diagnosis not present

## 2022-03-27 DIAGNOSIS — T50905A Adverse effect of unspecified drugs, medicaments and biological substances, initial encounter: Secondary | ICD-10-CM | POA: Diagnosis not present

## 2022-03-27 DIAGNOSIS — D7212 Drug rash with eosinophilia and systemic symptoms syndrome: Secondary | ICD-10-CM | POA: Diagnosis not present

## 2022-03-27 DIAGNOSIS — I10 Essential (primary) hypertension: Secondary | ICD-10-CM

## 2022-03-27 NOTE — Patient Instructions (Addendum)
Drug Reaction - Will obtain bloodwork today to recheck your counts.  - Continue Prednisone 30mg  daily. Once we get your labwork back, we will let you know what to do with the prednisone dose.   - Stay off Allopurinol. I have added this to your allergy list. - Do a daily soaking tub bath in warm water for 10-15 minutes.  - Use a gentle, unscented cleanser at the end of the bath (such as Dove unscented bar or baby wash, or Aveeno sensitive body wash). Then rinse, pat half-way dry, and apply a gentle, unscented moisturizer cream or ointment (Cerave with Vaseline) all over while still damp. Dry skin makes the itching and rash of eczema worse. The skin should be moisturized with a gentle, unscented moisturizer at least twice daily.  - Use only unscented liquid laundry detergent. - Hold off on starting any new vitamins/supplements at this time.  Once your reaction is completely cleared, you can restart them.

## 2022-03-27 NOTE — Progress Notes (Signed)
FOLLOW UP Date of Service/Encounter:  03/27/22   Subjective:  [name] (DOB: Jan 11, 1946) is a 77 y.o. female who returns to the [facility] on 03/27/2022 for follow up for DRESS Syndrome.   History obtained from: chart review and patient. Last visit was with me on 03/20/2022 for DRESS Syndrome- Allopurinol.  AEC was rechecked and was down to 0 on Prednisone 40mg  daily so we had decreased her dose to 30mg  daily.  Since last visit, she reports doing well.  No more swelling. She had one episode of itching but it resolved after a few hours.  No red rash but still has hypopigmentation. No more skin flaking.  Using Cerave, Vaseline, Cocoa Butter and Aveeno body wash.  She is taking Prednisone 30mg  daily and has 14 tablets left of the 10mg .    Her BP is doing better also. She is back on Valsartan-HCTZ and Amlodipine.    Past Medical History: Past Medical History:  Diagnosis Date   Diverticulosis    Hiatal hernia    Hyperlipidemia    borderline   Hypertension    Spinal stenosis 2019    Objective:  BP (!) 140/82   Pulse 72   Temp 97.7 F (36.5 C)   Ht 5\' 9"  (1.753 m)   Wt 197 lb (89.4 kg)   SpO2 98%   BMI 29.09 kg/m  Body mass index is 29.09 kg/m. Physical Exam: GEN: alert, well developed HEENT: clear conjunctiva, MMM HEART: regular rate and rhythm, no murmur LUNGS: clear to auscultation bilaterally, no coughing, unlabored respiration SKIN: hypopigmented areas on bl arms, neck and chest.  No peeling/erythema.   Assessment:   1. DRESS syndrome     Plan/Recommendations:  Drug Reaction - Attempted topical steroids initially as her main complaint was cutaneous but repeat AEC count increased to ~3000 requiring initiation of oral prednisone. We have been able to decrease the prednisone down to 30mg  daily (from 40mg  daily). Will obtain bloodwork today to recheck your counts. Renal and liver function are back to baseline on prior labs.  Your symptoms are  improving and will take several weeks/months for it to go back to baseline.  There is medical necessity for frequent follow ups and labwork with this condition as DRESS can result in life threatening severe drug reaction if not controlled.    - Continue Prednisone 30mg  daily. Once we get your labwork back, we will let you know what to do with the prednisone dose.   - Stay off Allopurinol. I have added this to your allergy list. - Do a daily soaking tub bath in warm water for 10-15 minutes.  - Use a gentle, unscented cleanser at the end of the bath (such as Dove unscented bar or baby wash, or Aveeno sensitive body wash). Then rinse, pat half-way dry, and apply a gentle, unscented moisturizer cream or ointment (Cerave with Vaseline) all over while still damp. Dry skin makes the itching and rash of eczema worse. The skin should be moisturized with a gentle, unscented moisturizer at least twice daily.  - Use only unscented liquid laundry detergent. - Hold off on starting any new vitamins/supplements at this time.  Once your reaction is completely cleared, you can restart them.     Essential HTN - BP has improved. Continue amlodipine 10mg  daily and valsartan-hctz 320-25mg  daily.   These were all chronic anti hypertensives that she was on for years prior to the reaction so I do not think they were the cause  for the drug reaction.  - Discussed that prednisone can also raise blood pressure but unfortunately it is necessary right now to treat the drug reaction.    Return in about 2 weeks (around 04/10/2022).  Harlon Flor, MD [facility]

## 2022-03-28 LAB — CBC WITH DIFFERENTIAL/PLATELET
Basophils Absolute: 0.1 10*3/uL (ref 0.0–0.2)
Basos: 1 %
EOS (ABSOLUTE): 0.2 10*3/uL (ref 0.0–0.4)
Eos: 2 %
Hematocrit: 37.9 % (ref 34.0–46.6)
Hemoglobin: 12.1 g/dL (ref 11.1–15.9)
Immature Grans (Abs): 0 10*3/uL (ref 0.0–0.1)
Immature Granulocytes: 0 %
Lymphocytes Absolute: 1.9 10*3/uL (ref 0.7–3.1)
Lymphs: 21 %
MCH: 28.5 pg (ref 26.6–33.0)
MCHC: 31.9 g/dL (ref 31.5–35.7)
MCV: 89 fL (ref 79–97)
Monocytes Absolute: 1.1 10*3/uL — ABNORMAL HIGH (ref 0.1–0.9)
Monocytes: 13 %
Neutrophils Absolute: 5.5 10*3/uL (ref 1.4–7.0)
Neutrophils: 63 %
Platelets: 306 10*3/uL (ref 150–450)
RBC: 4.25 x10E6/uL (ref 3.77–5.28)
RDW: 16.9 % — ABNORMAL HIGH (ref 11.7–15.4)
WBC: 8.8 10*3/uL (ref 3.4–10.8)

## 2022-03-28 MED ORDER — PREDNISONE 10 MG PO TABS: ORAL_TABLET | ORAL | 0 refills | Status: AC

## 2022-03-28 NOTE — Addendum Note (Signed)
Addended byHarlon Flor on: 03/28/2022 11:58 AM   Modules accepted: Orders

## 2022-03-29 NOTE — Progress Notes (Signed)
Patient called and stated that a prescription was sent in for more prednisone and she was unsure why. She did state that Dr. Posey Pronto informed her that someone would call her on 03/28/2022 however she never received a call. Informed patient of lab results and Dr. Serita Grit recommendation. Patient verbalized understanding.

## 2022-04-06 ENCOUNTER — Other Ambulatory Visit: Payer: Self-pay | Admitting: Family Medicine

## 2022-04-06 DIAGNOSIS — E785 Hyperlipidemia, unspecified: Secondary | ICD-10-CM

## 2022-04-10 ENCOUNTER — Ambulatory Visit: Payer: Medicare Other | Admitting: Internal Medicine

## 2022-04-10 ENCOUNTER — Other Ambulatory Visit: Payer: Self-pay

## 2022-04-10 ENCOUNTER — Encounter: Payer: Self-pay | Admitting: Internal Medicine

## 2022-04-10 VITALS — BP 150/70 | HR 81 | Temp 98.4°F | Resp 16 | Ht 69.0 in | Wt 198.7 lb

## 2022-04-10 DIAGNOSIS — D7212 Drug rash with eosinophilia and systemic symptoms syndrome: Secondary | ICD-10-CM

## 2022-04-10 DIAGNOSIS — T50905A Adverse effect of unspecified drugs, medicaments and biological substances, initial encounter: Secondary | ICD-10-CM | POA: Diagnosis not present

## 2022-04-10 DIAGNOSIS — Z889 Allergy status to unspecified drugs, medicaments and biological substances status: Secondary | ICD-10-CM

## 2022-04-10 NOTE — Patient Instructions (Signed)
Drug Reaction - Will obtain bloodwork today to recheck your counts.  - Continue Prednisone 60m daily. Once we get your labwork back, we will let you know what to do with the prednisone dose.   - Stay off Allopurinol. I have added this to your allergy list. - Do a daily soaking tub bath in warm water for 10-15 minutes.  - Use a gentle, unscented cleanser at the end of the bath (such as Dove unscented bar or baby wash, or Aveeno sensitive body wash). Then rinse, pat half-way dry, and apply a gentle, unscented moisturizer cream or ointment (Cerave with Vaseline) all over while still damp. Dry skin makes the itching and rash of eczema worse. The skin should be moisturized with a gentle, unscented moisturizer at least twice daily.  - Use only unscented liquid laundry detergent. - Hold off on starting any new vitamins/supplements at this time.  Once your reaction is completely cleared, you can restart them.

## 2022-04-10 NOTE — Progress Notes (Signed)
   FOLLOW UP Date of Service/Encounter:  04/10/22   Subjective:  [name] (DOB: 05-Mar-1945) is a 77 y.o. female who returns to the [facility] on 04/10/2022 for follow up for DRESS syndrome.   History obtained from: chart review and patient. Last visit was 2 weeks ago with me and CBC with stable AEC.  We were able to decrease her prednisone down to 20mg  followed by 10mg .  She reports doing well since last visit.  Still with hypopigmentation but no other itching or swelling.   She is down to 10mg  daily since about 5-6 days ago.  Prior to that was on 20mg  for 1 week.  Her BP is slightly elevated.    Past Medical History: Past Medical History:  Diagnosis Date   Diverticulosis    Hiatal hernia    Hyperlipidemia    borderline   Hypertension    Spinal stenosis 2019    Objective:  BP (!) 150/70   Pulse 81   Temp 98.4 F (36.9 C)   Resp 16   Ht 5\' 9"  (1.753 m)   Wt 198 lb 11.2 oz (90.1 kg)   SpO2 97%   BMI 29.34 kg/m  Body mass index is 29.34 kg/m. Physical Exam: GEN: alert, well developed HEENT: clear conjunctiva, MMM HEART: regular rate and rhythm, no murmur LUNGS: clear to auscultation bilaterally, no coughing, unlabored respiration SKIN: hypopigmented areas on bl arms, neck and chest.  No peeling/erythema.    Assessment:   1. DRESS syndrome     Plan/Recommendations:   Drug Reaction- DRESS Syndrome  - Attempted topical steroids initially as her main complaint was cutaneous but repeat AEC count increased to ~3000 requiring initiation of oral prednisone. We have been able to decrease the prednisone down to 10mg  daily (from 40mg  daily). Will obtain bloodwork today to recheck your counts. Renal and liver function are back to baseline on prior labs.  Your symptoms are improving and will take several weeks/months for it to go back to baseline.  There is medical necessity for frequent follow ups and labwork with this condition as DRESS can result in  life threatening severe drug reaction if not controlled.    - Continue Prednisone 10mg  daily. Once we get your labwork back, we will let you know what to do with the prednisone dose.   - Stay off Allopurinol. I have added this to your allergy list. - Do a daily soaking tub bath in warm water for 10-15 minutes.  - Use a gentle, unscented cleanser at the end of the bath (such as Dove unscented bar or baby wash, or Aveeno sensitive body wash). Then rinse, pat half-way dry, and apply a gentle, unscented moisturizer cream or ointment (Cerave with Vaseline) all over while still damp. Dry skin makes the itching and rash of eczema worse. The skin should be moisturized with a gentle, unscented moisturizer at least twice daily.  - Use only unscented liquid laundry detergent. - Hold off on starting any new vitamins/supplements at this time.  Once your reaction is completely cleared, you can restart them.     Return in about 2 weeks (around 04/24/2022).  Harlon Flor, MD [facility]

## 2022-04-11 LAB — CBC WITH DIFFERENTIAL/PLATELET
Basophils Absolute: 0 10*3/uL (ref 0.0–0.2)
Basos: 0 %
EOS (ABSOLUTE): 0.1 10*3/uL (ref 0.0–0.4)
Eos: 1 %
Hematocrit: 35.5 % (ref 34.0–46.6)
Hemoglobin: 11.6 g/dL (ref 11.1–15.9)
Immature Grans (Abs): 0 10*3/uL (ref 0.0–0.1)
Immature Granulocytes: 0 %
Lymphocytes Absolute: 1.1 10*3/uL (ref 0.7–3.1)
Lymphs: 13 %
MCH: 28.4 pg (ref 26.6–33.0)
MCHC: 32.7 g/dL (ref 31.5–35.7)
MCV: 87 fL (ref 79–97)
Monocytes Absolute: 0.4 10*3/uL (ref 0.1–0.9)
Monocytes: 5 %
Neutrophils Absolute: 7.1 10*3/uL — ABNORMAL HIGH (ref 1.4–7.0)
Neutrophils: 81 %
Platelets: 183 10*3/uL (ref 150–450)
RBC: 4.08 x10E6/uL (ref 3.77–5.28)
RDW: 15.7 % — ABNORMAL HIGH (ref 11.7–15.4)
WBC: 8.7 10*3/uL (ref 3.4–10.8)

## 2022-04-17 ENCOUNTER — Other Ambulatory Visit: Payer: Self-pay | Admitting: Pharmacist

## 2022-04-17 NOTE — Progress Notes (Signed)
Patient appearing on report for [facility] - Hypertension Control report due to last documented ambulatory blood pressure of 150/70 on 04/10/22. Next appointment with PCP is 05/03/22   Outreached patient to discuss hypertension control and medication management.   Current antihypertensives: amlodipine 83m daily, valsartan-hctz 320-270mdaily  Patient does not have an automated upper arm home BP machine.  Current blood pressure readings: not currently checking at home.   Patient denies hypotensive signs and symptoms including dizziness, lightheadedness.  Patient denies hypertensive symptoms including headache, chest pain, shortness of breath.  Assessment/Plan: - Currently unknown control, but plans in place to bring her current machine (not working well) to upcoming PCP visit to check against our office readings, and counseled patient on ordering "omron" brand upper arm BP cuff if her current one is determined faulty. - - Reviewed appropriate home BP monitoring technique (avoid caffeine, smoking, and exercise for 30 minutes before checking, rest for at least 5 minutes before taking BP, sit with feet flat on the floor and back against a hard surface, uncross legs, and rest arm on flat surface) - Reviewed to check blood pressure periodically, document, and provide at next provider visit - Recommend continue current regimen  KeLarinda ButteryPharmD Clinical Pharmacist [phone]

## 2022-04-17 NOTE — Patient Instructions (Signed)
[name],  Thank you for speaking with me today. As discussed, look for "omron" brand of upper arm blood pressure cuff, if you decide to get a new one after checking your current machine against our readings at your upcoming appointment with Dr. Madilyn Fireman.  Below are some helpful tips about checking blood pressure:  Check your blood pressure periodically, and any time you have concerning symptoms like headache, chest pain, dizziness, shortness of breath, or vision changes.   Our goal is less than 140/90.  To appropriately check your blood pressure, make sure you do the following:  1) Avoid caffeine, exercise, or tobacco products for 30 minutes before checking. Empty your bladder. 2) Sit with your back supported in a flat-backed chair. Rest your arm on something flat (arm of the chair, table, etc). 3) Sit still with your feet flat on the floor, resting, for at least 5 minutes.  4) Check your blood pressure. Take 1-2 readings.  5) Write down these readings and bring with you to any provider appointments.  Bring your home blood pressure machine with you to a provider's office for accuracy comparison at least once a year.   Make sure you take your blood pressure medications before you come to any office visit, even if you were asked to fast for labs.   Take care, [name], PharmD Clinical Pharmacist [facility] [phone]

## 2022-04-19 ENCOUNTER — Ambulatory Visit: Payer: Medicare Other | Admitting: Family Medicine

## 2022-04-24 ENCOUNTER — Encounter: Payer: Self-pay | Admitting: Internal Medicine

## 2022-04-24 ENCOUNTER — Ambulatory Visit (INDEPENDENT_AMBULATORY_CARE_PROVIDER_SITE_OTHER): Payer: Medicare Other | Admitting: Internal Medicine

## 2022-04-24 ENCOUNTER — Other Ambulatory Visit: Payer: Self-pay

## 2022-04-24 VITALS — BP 140/60 | HR 77 | Temp 97.3°F | Resp 16

## 2022-04-24 DIAGNOSIS — Z889 Allergy status to unspecified drugs, medicaments and biological substances status: Secondary | ICD-10-CM

## 2022-04-24 DIAGNOSIS — T50905A Adverse effect of unspecified drugs, medicaments and biological substances, initial encounter: Secondary | ICD-10-CM

## 2022-04-24 DIAGNOSIS — L819 Disorder of pigmentation, unspecified: Secondary | ICD-10-CM | POA: Diagnosis not present

## 2022-04-24 DIAGNOSIS — D7212 Drug rash with eosinophilia and systemic symptoms syndrome: Secondary | ICD-10-CM | POA: Diagnosis not present

## 2022-04-24 NOTE — Patient Instructions (Addendum)
Drug Reaction - Will obtain bloodwork today to recheck your counts.  - Stay off Allopurinol. I have added this to your allergy list. - Do a daily soaking tub bath in warm water for 10-15 minutes.  - Use a gentle, unscented cleanser at the end of the bath (such as Dove unscented bar or baby wash, or Aveeno sensitive body wash). Then rinse, pat half-way dry, and apply a gentle, unscented moisturizer cream or ointment (Cerave with Vaseline) all over while still damp. Dry skin makes the itching and rash of eczema worse. The skin should be moisturized with a gentle, unscented moisturizer at least twice daily.  - Use only unscented liquid laundry detergent. - If your labs are all normal, okay to restart vitamins.   - Will refer to Dermatology- [facility] for persistent hypopigmentation.     Return if symptoms worsen or fail to improve.   Marcy Panning Shan

## 2022-04-24 NOTE — Progress Notes (Signed)
   FOLLOW UP Date of Service/Encounter:  04/27/22   Subjective:  [name] (DOB: 24-Sep-1945) is a 77 y.o. female who returns to the [facility] on 04/24/2022 for follow up for DRESS syndrome to allopurinol.  History obtained from: chart review and patient. Last visit was with me on 04/10/2022 and her [place] continues to remain stable so we were able to wean prednisone to '5mg'$  for 1 week and then stopping it.  Since last visit, she reports doing well.  No facial swelling.  Moisturizing her skin regularly but still has this persistent hypopigmentation. Never had this prior to this reaction.  Denies history of vitiligo.   Past Medical History: Past Medical History:  Diagnosis Date   Diverticulosis    Hiatal hernia    Hyperlipidemia    borderline   Hypertension    Spinal stenosis 2019    Objective:  BP (!) 140/60   Pulse 77   Temp (!) 97.3 F (36.3 C) (Temporal)   Resp 16   SpO2 97%  There is no height or weight on file to calculate BMI. Physical Exam: GEN: alert, well developed HEENT: clear conjunctiva, MMM HEART: regular rate and rhythm, no murmur LUNGS: clear to auscultation bilaterally, no coughing, unlabored respiration SKIN: hypopigmented patches on forehead, chest, arms  Assessment:   1. DRESS syndrome     Plan/Recommendations:   Drug Reaction- DRESS Syndrome  - Attempted topical steroids initially as her main complaint was cutaneous but repeat AEC count increased to ~3000 requiring initiation of oral prednisone. We have since weaned off the prednisone with regularly lab follow up. Renal, liver function and AEC are back to baseline. There is medical necessity for frequent follow ups and labwork with this condition as DRESS can result in life threatening severe drug reaction if not controlled.    - Stay off Allopurinol. I have added this to your allergy list. - Do a daily soaking tub bath in warm water for 10-15 minutes.  - Use a gentle, unscented  cleanser at the end of the bath (such as Dove unscented bar or baby wash, or Aveeno sensitive body wash). Then rinse, pat half-way dry, and apply a gentle, unscented moisturizer cream or ointment (Cerave with Vaseline) all over while still damp. Dry skin makes the itching and rash of eczema worse. The skin should be moisturized with a gentle, unscented moisturizer at least twice daily.  - Use only unscented liquid laundry detergent. - If your labs are all normal, okay to restart vitamins.   - Will refer to Dermatology- [facility] for persistent hypopigmentation.  Discussed there are some cases of post DRESS vitiligo/post inflammatory hypopigmentation. She has tried topical steroids without improvement and has completed a length course of prednisone taper.   Return if symptoms worsen or fail to improve.  Harlon Flor, MD [facility]

## 2022-04-25 LAB — CBC WITH DIFFERENTIAL/PLATELET
Basophils Absolute: 0 10*3/uL (ref 0.0–0.2)
Basos: 1 %
EOS (ABSOLUTE): 0.2 10*3/uL (ref 0.0–0.4)
Eos: 3 %
Hematocrit: 32.5 % — ABNORMAL LOW (ref 34.0–46.6)
Hemoglobin: 10.3 g/dL — ABNORMAL LOW (ref 11.1–15.9)
Immature Grans (Abs): 0 10*3/uL (ref 0.0–0.1)
Immature Granulocytes: 0 %
Lymphocytes Absolute: 1.9 10*3/uL (ref 0.7–3.1)
Lymphs: 36 %
MCH: 27.5 pg (ref 26.6–33.0)
MCHC: 31.7 g/dL (ref 31.5–35.7)
MCV: 87 fL (ref 79–97)
Monocytes Absolute: 0.7 10*3/uL (ref 0.1–0.9)
Monocytes: 12 %
Neutrophils Absolute: 2.6 10*3/uL (ref 1.4–7.0)
Neutrophils: 48 %
Platelets: 275 10*3/uL (ref 150–450)
RBC: 3.74 x10E6/uL — ABNORMAL LOW (ref 3.77–5.28)
RDW: 14.8 % (ref 11.7–15.4)
WBC: 5.4 10*3/uL (ref 3.4–10.8)

## 2022-05-01 ENCOUNTER — Other Ambulatory Visit: Payer: Self-pay | Admitting: Family Medicine

## 2022-05-01 DIAGNOSIS — I10 Essential (primary) hypertension: Secondary | ICD-10-CM

## 2022-05-03 ENCOUNTER — Ambulatory Visit (INDEPENDENT_AMBULATORY_CARE_PROVIDER_SITE_OTHER): Payer: Medicare Other | Admitting: Family Medicine

## 2022-05-03 ENCOUNTER — Encounter: Payer: Self-pay | Admitting: Family Medicine

## 2022-05-03 VITALS — BP 126/55 | HR 80 | Ht 69.0 in | Wt 198.0 lb

## 2022-05-03 DIAGNOSIS — E1169 Type 2 diabetes mellitus with other specified complication: Secondary | ICD-10-CM

## 2022-05-03 DIAGNOSIS — N1832 Chronic kidney disease, stage 3b: Secondary | ICD-10-CM

## 2022-05-03 DIAGNOSIS — I10 Essential (primary) hypertension: Secondary | ICD-10-CM

## 2022-05-03 DIAGNOSIS — T50905A Adverse effect of unspecified drugs, medicaments and biological substances, initial encounter: Secondary | ICD-10-CM | POA: Diagnosis not present

## 2022-05-03 DIAGNOSIS — D7212 Drug rash with eosinophilia and systemic symptoms syndrome: Secondary | ICD-10-CM

## 2022-05-03 LAB — POCT GLYCOSYLATED HEMOGLOBIN (HGB A1C): Hemoglobin A1C: 6.3 % — AB (ref 4.0–5.6)

## 2022-05-03 NOTE — Assessment & Plan Note (Signed)
A1c looks great today at 6.3 especially considering she has been on steroids for quite some time.  She really has done a great job and making some changes to her diet.

## 2022-05-03 NOTE — Assessment & Plan Note (Addendum)
Lab work shows that symptoms are overall resolved but unfortunately she has been left with a lot of hypopigmentation so we will be consulting with dermatology for that.

## 2022-05-03 NOTE — Assessment & Plan Note (Addendum)
repeat blood pressure looks phenomenal today.  We did discuss the possibility of combining her 2 blood pressure pills with Tribenzor.  It looks like it is covered with the insurance but I am not sure about potential cost.  She just got a 90-day supply on her medications so we will wait until she is due for refill in 3 months.

## 2022-05-03 NOTE — Assessment & Plan Note (Addendum)
Recent CMP shows stable kidney function.

## 2022-05-03 NOTE — Patient Instructions (Signed)
We can consider switching you to Tribenzor to combine your 2 blood pressure pills.

## 2022-05-03 NOTE — Progress Notes (Signed)
Established Patient Office Visit  Subjective   Patient ID: [name], female    DOB: [birth year]  Age: 77 y.o. MRN: [id number]  Chief Complaint  Patient presents with   Follow-up    7 week f/u Blood pressure     HPI Hypertension- Pt denies chest pain, SOB, dizziness, or heart palpitations.  Taking meds as directed w/o problems.  Denies medication side effects.    Diabetes - no hypoglycemic events. No wounds or sores that are not healing well. No increased thirst or urination. Checking glucose at home. Taking medications as prescribed without any side effects.  Has been really working on her diet and trying to eat more healthy.  Lab Results  Component Value Date   HGBA1C 6.3 (A) 05/03/2022   Still currently tapering off steroids for dress syndrome and using a topical steroid cream as needed.  The allergist has completely released her but does want her to see dermatology as unfortunately she has a lot of hypopigmentation from the [place] areas that were there previously.    ROS    Objective:     BP (!) 126/55 (BP Location: Left Arm, Patient Position: Sitting, Cuff Size: Large)   Pulse 80   Ht '5\' 9"'$  (1.753 m)   Wt 198 lb 0.6 oz (89.8 kg)   SpO2 100%   BMI 29.25 kg/m    Physical Exam Vitals and nursing note reviewed.  Constitutional:      Appearance: She is well-developed.  HENT:     Head: Normocephalic and atraumatic.  Cardiovascular:     Rate and Rhythm: Normal rate and regular rhythm.     Heart sounds: Normal heart sounds.  Pulmonary:     Effort: Pulmonary effort is normal.     Breath sounds: Normal breath sounds.  Skin:    General: Skin is warm and dry.  Neurological:     Mental Status: She is alert and oriented to person, place, and time.  Psychiatric:        Behavior: Behavior normal.     Results for orders placed or performed in visit on 05/03/22  POCT HgB A1C  Result Value Ref Range   Hemoglobin A1C 6.3 (A) 4.0 - 5.6 %   HbA1c POC (<>  result, manual entry)     HbA1c, POC (prediabetic range)     HbA1c, POC (controlled diabetic range)        The 10-year ASCVD risk score (Arnett DK, et al., 2019) is: 24.7%    Assessment & Plan:   Problem List Items Addressed This Visit       Cardiovascular and Mediastinum   HYPERTENSION, BENIGN - Primary    repeat blood pressure looks phenomenal today.  We did discuss the possibility of combining her 2 blood pressure pills with Tribenzor.  It looks like it is covered with the insurance but I am not sure about potential cost.  She just got a 90-day supply on her medications so we will wait until she is due for refill in 3 months.        Endocrine   Diabetes type 2, controlled ([place])    A1c looks great today at 6.3 especially considering she has been on steroids for quite some time.  She really has done a great job and making some changes to her diet.      Relevant Orders   POCT HgB A1C (Completed)     Musculoskeletal and Integument   DRESS syndrome    Lab work  shows that symptoms are overall resolved but unfortunately she has been left with a lot of hypopigmentation so we will be consulting with dermatology for that.        Genitourinary   Chronic kidney disease (CKD) stage G3b/A1, moderately decreased glomerular filtration rate (GFR) between 30-44 mL/min/1.73 square meter and albuminuria creatinine ratio less than 30 mg/g (HCC)    Recent CMP shows stable kidney function.       Return in about 4 months (around 09/02/2022) for Diabetes follow-up, Hypertension.    Beatrice Lecher, MD

## 2022-05-21 ENCOUNTER — Telehealth: Payer: Self-pay | Admitting: *Deleted

## 2022-05-21 NOTE — Telephone Encounter (Signed)
Pt called and stated that she did not take the Rosuvastatin 10 mg this past Saturday and Sunday.   She said that she has been losing large amounts of hair.   She informed me that she did go online to see if this was a side effect of the medication and it reports that it is.    I looked back on her medication list to see how long she has been taking this medication and if she had been on any other cholesterol lowering medications and she has not. She has been taking Rosuvastatin 10 mg since 04/13/2021.   Her lipids were last checked 07/18/2021.   Pt has a follow up with Dr. Madilyn Fireman on 6/24. She also has a follow up with the dermatologist on 05/30/2022.    Will fwd to pcp for advice

## 2022-05-21 NOTE — Telephone Encounter (Signed)
Get is very unlikely related to the Crestor.  I think it has more to do with everything that her body has gone through in the last several months with the dress syndrome.  When the body undergoes a big stress like that it is not unusual for there to be significant hair loss after the event.  The good news is that it does usually grow back.  But if she wants to hold the Crestor for a month or 2 before restarting it I think that is perfectly fine.  But I do not think that the 2 are related at all.

## 2022-05-23 NOTE — Telephone Encounter (Signed)
Pt notified of Dr. Gardiner Ramus recommendations.

## 2022-05-30 ENCOUNTER — Ambulatory Visit: Payer: Medicare Other | Admitting: Dermatology

## 2022-05-30 ENCOUNTER — Encounter: Payer: Self-pay | Admitting: Dermatology

## 2022-05-30 DIAGNOSIS — L80 Vitiligo: Secondary | ICD-10-CM

## 2022-05-30 MED ORDER — OPZELURA 1.5 % EX CREA: 1.0000 | TOPICAL_CREAM | Freq: Every day | CUTANEOUS | 2 refills | Status: DC

## 2022-05-30 NOTE — Patient Instructions (Signed)
Due to recent changes in healthcare laws, you may see results of your pathology and/or laboratory studies on MyChart before the doctors have had a chance to review them. We understand that in some cases there may be results that are confusing or concerning to you. Please understand that not all results are received at the same time and often the doctors may need to interpret multiple results in order to provide you with the best plan of care or course of treatment. Therefore, we ask that you please give [place] 2 business days to thoroughly review all your results before contacting the office for clarification. Should we see a critical lab result, you will be contacted sooner.   If You Need Anything After Your Visit  If you have any questions or concerns for your doctor, please call our main line at [phone] If no one answers, please leave a voicemail as directed and we will return your call as soon as possible. Messages left after 4 pm will be answered the following business day.   You may also send [place] a message via [place]. We typically respond to MyChart messages within 1-2 business days.  For prescription refills, please ask your pharmacy to contact our office. Our fax number is [phone].  If you have an urgent issue when the clinic is closed that cannot wait until the next business day, you can page your doctor at the number below.    Please note that while we do our best to be available for urgent issues outside of office hours, we are not available 24/7.   If you have an urgent issue and are unable to reach [place], you may choose to seek medical care at your doctor's office, retail clinic, urgent care center, or emergency room.  If you have a medical emergency, please immediately call 911 or go to the emergency department. In the event of inclement weather, please call our main line at [phone] for an update on the status of any delays or closures.  Dermatology Medication Tips: Please  keep the boxes that topical medications come in in order to help keep track of the instructions about where and how to use these. Pharmacies typically print the medication instructions only on the boxes and not directly on the medication tubes.   If your medication is too expensive, please contact our office at [phone] or send [place] a message through [place].   We are unable to tell what your co-pay for medications will be in advance as this is different depending on your insurance coverage. However, we may be able to find a substitute medication at lower cost or fill out paperwork to get insurance to cover a needed medication.   If a prior authorization is required to get your medication covered by your insurance company, please allow [place] 1-2 business days to complete this process.  Drug prices often vary depending on where the prescription is filled and some pharmacies may offer cheaper prices.  The website www.goodrx.com contains coupons for medications through different pharmacies. The prices here do not account for what the cost may be with help from insurance (it may be cheaper with your insurance), but the website can give you the price if you did not use any insurance.  - You can print the associated coupon and take it with your prescription to the pharmacy.  - You may also stop by our office during regular business hours and pick up a GoodRx coupon card.  - If you need your  prescription sent electronically to a different pharmacy, notify our office through [facility] or by phone at [phone]     Your prescription was sent to [facility] in [place] representative from [facility] will contact you within 2 business hours to verify your address and insurance information to schedule a free delivery. If for any reason you do not receive a phone call from them, please reach out to them. Their phone number is [phone] and their hours are Monday-Friday 9:00 am-5:00  pm.

## 2022-05-30 NOTE — Progress Notes (Signed)
   New Patient Visit  Subjective  [name] is a 77 y.o. female who presents for the following: Consultation  (Patient was previously seeing an allergist for lightening for skin. Allergist recommended that patient was allergic to a medication given for gout. Reaction started around December of 2023. While still on the medication, skin was itchy and irritated but that is under control. Patient is wanting to know how long it will take the pigment to come back to the skin. ).    Objective  Well appearing patient in no apparent distress; mood and affect are within normal limits.  All skin waist up examined.  Arms, Trunk Depigmented patches with speckled of repigmentation   Assessment & Plan  Vitiligo Trunk; Arms  Vitiligo is a chronic autoimmune condition which causes loss of skin pigment and is commonly seen on the face and may also involve areas of trauma like hands, elbows, knees, and ankles. There is no cure and it is difficult to treat.  Treatments include topical steroids and other topical anti-inflammatory ointments/creams and topical and oral Jak inhibitors.  Sometimes narrow band UV light therapy or Xtrac laser is helpful, both of which require twice weekly treatments for at least 3-6 months.  Antioxidant vitamins, such as Vitamins A,C,E,D, Folic Acid and B12 may be added to enhance treatment. Heliocare may also enhance treatment results.   Prescription Opzelura was given to apply once daily   Related Medications Ruxolitinib Phosphate (OPZELURA) 1.5 % CREA Apply 1 Application topically daily.   Return in about 3 months (around 08/29/2022) for Vitiligo follow up.  I, Zigmund Gottron, CMA, am acting as scribe for Ellard Artis, MD.

## 2022-06-07 ENCOUNTER — Other Ambulatory Visit: Payer: Self-pay

## 2022-06-07 MED ORDER — TRIAMCINOLONE ACETONIDE 0.1 % EX CREA: 1.0000 | TOPICAL_CREAM | Freq: Two times a day (BID) | CUTANEOUS | 11 refills | Status: DC | PRN

## 2022-07-01 ENCOUNTER — Other Ambulatory Visit: Payer: Self-pay | Admitting: Family Medicine

## 2022-07-01 DIAGNOSIS — E785 Hyperlipidemia, unspecified: Secondary | ICD-10-CM

## 2022-08-20 ENCOUNTER — Ambulatory Visit (INDEPENDENT_AMBULATORY_CARE_PROVIDER_SITE_OTHER): Payer: Medicare Other

## 2022-08-20 ENCOUNTER — Encounter: Payer: Self-pay | Admitting: Family Medicine

## 2022-08-20 ENCOUNTER — Ambulatory Visit (INDEPENDENT_AMBULATORY_CARE_PROVIDER_SITE_OTHER): Payer: Medicare Other | Admitting: Family Medicine

## 2022-08-20 VITALS — BP 126/74 | HR 80 | Ht 69.0 in | Wt 209.0 lb

## 2022-08-20 DIAGNOSIS — R2241 Localized swelling, mass and lump, right lower limb: Secondary | ICD-10-CM | POA: Diagnosis not present

## 2022-08-20 DIAGNOSIS — M7731 Calcaneal spur, right foot: Secondary | ICD-10-CM | POA: Diagnosis not present

## 2022-08-20 DIAGNOSIS — I10 Essential (primary) hypertension: Secondary | ICD-10-CM | POA: Diagnosis not present

## 2022-08-20 DIAGNOSIS — N1832 Chronic kidney disease, stage 3b: Secondary | ICD-10-CM | POA: Diagnosis not present

## 2022-08-20 DIAGNOSIS — M7989 Other specified soft tissue disorders: Secondary | ICD-10-CM

## 2022-08-20 DIAGNOSIS — E1169 Type 2 diabetes mellitus with other specified complication: Secondary | ICD-10-CM | POA: Diagnosis not present

## 2022-08-20 DIAGNOSIS — M109 Gout, unspecified: Secondary | ICD-10-CM

## 2022-08-20 DIAGNOSIS — M79671 Pain in right foot: Secondary | ICD-10-CM

## 2022-08-20 DIAGNOSIS — E785 Hyperlipidemia, unspecified: Secondary | ICD-10-CM

## 2022-08-20 DIAGNOSIS — M19071 Primary osteoarthritis, right ankle and foot: Secondary | ICD-10-CM | POA: Diagnosis not present

## 2022-08-20 LAB — POCT GLYCOSYLATED HEMOGLOBIN (HGB A1C): Hemoglobin A1C: 5.7 % — AB (ref 4.0–5.6)

## 2022-08-20 NOTE — Progress Notes (Signed)
Established Patient Office Visit  Subjective   Patient ID: [name], female    DOB: June 04, 1945  Age: 77 y.o. MRN: [id number]  Chief Complaint  Patient presents with   Diabetes   Hypertension   Foot Pain    Pt would like xray done     HPI   Diabetes - no hypoglycemic events. No wounds or sores that are not healing well. No increased thirst or urination. Checking glucose at home. Taking medications as prescribed without any side effects.   Hypertension- Pt denies chest pain, SOB, dizziness, or heart palpitations.  Taking meds as directed w/o problems.  Denies medication side effects.  Has been try to eat healthy and she is actually been back at the gym as well.  Only on Diovan HCTZ and amlodipine 10 mg.  Having right foot pain, worse this past Sunday.  She has noticed that it has been a little bit swollen over the midfoot and she has had some discomfort on and off she will notice it more when she is getting a pedicure done.  But this past Sunday it became more painful and now its painful to walk on it the last couple days she denies any known injury or trauma.  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  But says this is not her typical location for her gout flare.     ROS    Objective:     BP 126/74 (BP Location: Left Arm)   Pulse 80   Ht 5\' 9"  (1.753 m)   Wt 209 lb (94.8 kg)   SpO2 100%   BMI 30.86 kg/m    Physical Exam Vitals and nursing note reviewed.  Constitutional:      Appearance: She is well-developed.  HENT:     Head: Normocephalic and atraumatic.  Cardiovascular:     Rate and Rhythm: Normal rate and regular rhythm.     Heart sounds: Normal heart sounds.  Pulmonary:     Effort: Pulmonary effort is normal.     Breath sounds: Normal breath sounds.  Musculoskeletal:     Comments: He has some swelling over the mid to proximal foot.  She is tender along the fourth and fifth metatarsals.  Most  tender along the proximal fourth metatarsal.  Range of motion of her toes.  Nontender around the ankle or heel.  Skin:    General: Skin is warm and dry.  Neurological:     Mental Status: She is alert and oriented to person, place, and time.  Psychiatric:        Behavior: Behavior normal.      Results for orders placed or performed in visit on 08/20/22  POCT glycosylated hemoglobin (Hb A1C)  Result Value Ref Range   Hemoglobin A1C 5.7 (A) 4.0 - 5.6 %   HbA1c POC (<> result, manual entry)     HbA1c, POC (prediabetic range)     HbA1c, POC (controlled diabetic range)        The 10-year ASCVD risk score (Arnett DK, et al., 2019) is: 25.7%    Assessment & Plan:   Problem List Items Addressed This Visit       Cardiovascular and Mediastinum   HYPERTENSION, BENIGN    Blood pressure was elevated today we will recheck before she goes home.  If still elevated follow-up in 2 weeks for nurse visit.  The last time she was here in March  her repeat blood pressure came down and looked fantastic.  She has been trying to exercise more regularly and eat healthy.        Endocrine   Diabetes type 2, controlled (HCC) - Primary    A1c looks great today 5.7 she is not a really fantastic job in bringing that down.      Relevant Orders   POCT glycosylated hemoglobin (Hb A1C) (Completed)     Musculoskeletal and Integument   Podagra of right great toe   Relevant Orders   Uric acid     Genitourinary   Chronic kidney disease (CKD) stage G3b/A1, moderately decreased glomerular filtration rate (GFR) between 30-44 mL/min/1.73 square meter and albuminuria creatinine ratio less than 30 mg/g (HCC)    Recheck renal function.      Relevant Orders   COMPLETE METABOLIC PANEL WITH GFR   Lipid Panel w/reflex Direct LDL     Other   Hyperlipidemia    Will go ahead and recheck lipids.  Encouraged her to go ahead and restart her statin as well.      Relevant Orders   COMPLETE METABOLIC PANEL WITH GFR    Lipid Panel w/reflex Direct LDL   Other Visit Diagnoses     Swelling of right foot       Relevant Orders   DG Foot Complete Right      Midfoot, right-pain.  Will get x-ray today for further workup.  Even though it is not in the typical location for her gout flare it still a possibility especially since she has been off the allopurinol for quite some time.  For now recommend conservative care will call with results hopefully tomorrow.  Return in about 3 months (around 11/20/2022) for Diabetes follow-up, Hypertension.    Nani Gasser, MD

## 2022-08-20 NOTE — Assessment & Plan Note (Signed)
Blood pressure was elevated today we will recheck before she goes home.  If still elevated follow-up in 2 weeks for nurse visit.  The last time she was here in March her repeat blood pressure came down and looked fantastic.  She has been trying to exercise more regularly and eat healthy.

## 2022-08-20 NOTE — Assessment & Plan Note (Signed)
A1c looks great today 5.7 she is not a really fantastic job in bringing that down.

## 2022-08-20 NOTE — Patient Instructions (Signed)
Go ahead and restart your cholesterol medication.  If you need a refill please just let us know.

## 2022-08-20 NOTE — Progress Notes (Signed)
Eye exam done @ [facility] [phone] will call for report

## 2022-08-20 NOTE — Assessment & Plan Note (Signed)
Will go ahead and recheck lipids.  Encouraged her to go ahead and restart her statin as well.

## 2022-08-20 NOTE — Assessment & Plan Note (Signed)
Recheck renal function. ?

## 2022-08-21 LAB — COMPLETE METABOLIC PANEL WITH GFR
AG Ratio: 1.6 (calc) (ref 1.0–2.5)
ALT: 41 U/L — ABNORMAL HIGH (ref 6–29)
AST: 38 U/L — ABNORMAL HIGH (ref 10–35)
Albumin: 4.7 g/dL (ref 3.6–5.1)
Alkaline phosphatase (APISO): 94 U/L (ref 37–153)
BUN/Creatinine Ratio: 21 (calc) (ref 6–22)
BUN: 23 mg/dL (ref 7–25)
CO2: 33 mmol/L — ABNORMAL HIGH (ref 20–32)
Calcium: 10 mg/dL (ref 8.6–10.4)
Chloride: 98 mmol/L (ref 98–110)
Creat: 1.07 mg/dL — ABNORMAL HIGH (ref 0.60–1.00)
Globulin: 3 g/dL (calc) (ref 1.9–3.7)
Glucose, Bld: 109 mg/dL — ABNORMAL HIGH (ref 65–99)
Potassium: 4 mmol/L (ref 3.5–5.3)
Sodium: 139 mmol/L (ref 135–146)
Total Bilirubin: 0.5 mg/dL (ref 0.2–1.2)
Total Protein: 7.7 g/dL (ref 6.1–8.1)
eGFR: 53 mL/min/{1.73_m2} — ABNORMAL LOW (ref >=60)

## 2022-08-21 LAB — LIPID PANEL W/REFLEX DIRECT LDL
Cholesterol: 222 mg/dL — ABNORMAL HIGH (ref <200)
HDL: 60 mg/dL (ref >=50)
LDL Cholesterol (Calc): 131 mg/dL (calc) — ABNORMAL HIGH
Non-HDL Cholesterol (Calc): 162 mg/dL (calc) — ABNORMAL HIGH (ref <130)
Total CHOL/HDL Ratio: 3.7 (calc) (ref <5.0)
Triglycerides: 174 mg/dL — ABNORMAL HIGH (ref <150)

## 2022-08-21 LAB — URIC ACID: Uric Acid, Serum: 8.7 mg/dL — ABNORMAL HIGH (ref 2.5–7.0)

## 2022-08-22 NOTE — Progress Notes (Signed)
Hi [name], uric acid is back up to 8.7 it was down to 5.3 on the allopurinol.  I think for now we can stick with our plan to see if maybe you have another gouty flare if you do then I may actually work to get you in with a specialist/rheumatologist to discuss alternative options for treatment.  Kidney function is stable.  Liver enzymes are mildly elevated, similar to 6 months ago.  Strahl is elevated but that is not surprising since we have been holding the Crestor.  Please go ahead and restart the Crestor if you have not already.  The 10-year ASCVD risk score (Arnett DK, et al., 2019) is: 39.2%   Values used to calculate the score:     Age: 77 years     Sex: Female     Is Non-Hispanic African American: Yes     Diabetic: Yes     Tobacco smoker: No     Systolic Blood Pressure: 126 mmHg     Is BP treated: Yes     HDL Cholesterol: 60 mg/dL     Total Cholesterol: 222 mg/dL

## 2022-08-24 NOTE — Progress Notes (Signed)
Hi [name], the good thing is they did not see any fracture.  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  You do have a little bit of a heel spur.  I know that is not where you are swollen but you do have 1.  This still could be gout just keep that in mind.  So would recommend using your Tylenol ice and rest.  You can do an Ace wrap for compression as well over the weekend and see if that is helpful.  If it is not continuing to improve then would like to get you in with either Dr. Benjamin Stain or one of our podiatrists.

## 2022-09-03 ENCOUNTER — Ambulatory Visit: Payer: Medicare Other | Admitting: Dermatology

## 2022-09-30 ENCOUNTER — Other Ambulatory Visit: Payer: Self-pay | Admitting: Family Medicine

## 2022-09-30 DIAGNOSIS — I10 Essential (primary) hypertension: Secondary | ICD-10-CM

## 2022-10-03 ENCOUNTER — Other Ambulatory Visit: Payer: Self-pay | Admitting: *Deleted

## 2022-10-03 ENCOUNTER — Telehealth: Payer: Self-pay | Admitting: Family Medicine

## 2022-10-03 NOTE — Telephone Encounter (Signed)
Patient called in for a medication refill for Norvasc 10mg . Please advise  [facility] [place], [place]

## 2022-10-03 NOTE — Telephone Encounter (Signed)
Medication sent to patient's pharmacy.

## 2022-10-18 ENCOUNTER — Other Ambulatory Visit: Payer: Self-pay | Admitting: Nurse Practitioner

## 2022-10-18 DIAGNOSIS — Z13820 Encounter for screening for osteoporosis: Secondary | ICD-10-CM

## 2022-10-18 DIAGNOSIS — Z780 Asymptomatic menopausal state: Secondary | ICD-10-CM

## 2022-11-20 ENCOUNTER — Encounter: Payer: Self-pay | Admitting: Family Medicine

## 2022-11-20 ENCOUNTER — Ambulatory Visit (INDEPENDENT_AMBULATORY_CARE_PROVIDER_SITE_OTHER): Payer: Medicare Other | Admitting: Family Medicine

## 2022-11-20 VITALS — BP 128/68 | HR 82 | Ht 69.0 in | Wt 210.0 lb

## 2022-11-20 DIAGNOSIS — L80 Vitiligo: Secondary | ICD-10-CM

## 2022-11-20 DIAGNOSIS — E785 Hyperlipidemia, unspecified: Secondary | ICD-10-CM

## 2022-11-20 DIAGNOSIS — N1832 Chronic kidney disease, stage 3b: Secondary | ICD-10-CM

## 2022-11-20 DIAGNOSIS — I10 Essential (primary) hypertension: Secondary | ICD-10-CM

## 2022-11-20 DIAGNOSIS — E118 Type 2 diabetes mellitus with unspecified complications: Secondary | ICD-10-CM | POA: Diagnosis not present

## 2022-11-20 DIAGNOSIS — Z23 Encounter for immunization: Secondary | ICD-10-CM | POA: Diagnosis not present

## 2022-11-20 DIAGNOSIS — L918 Other hypertrophic disorders of the skin: Secondary | ICD-10-CM

## 2022-11-20 DIAGNOSIS — N183 Chronic kidney disease, stage 3 unspecified: Secondary | ICD-10-CM | POA: Diagnosis not present

## 2022-11-20 DIAGNOSIS — E1122 Type 2 diabetes mellitus with diabetic chronic kidney disease: Secondary | ICD-10-CM

## 2022-11-20 LAB — POCT GLYCOSYLATED HEMOGLOBIN (HGB A1C): Hemoglobin A1C: 6.1 % — AB (ref 4.0–5.6)

## 2022-11-20 MED ORDER — TACROLIMUS 0.1 % EX OINT
TOPICAL_OINTMENT | Freq: Two times a day (BID) | CUTANEOUS | 1 refills | Status: DC
Start: 2022-11-20 — End: 2023-03-25

## 2022-11-20 MED ORDER — ROSUVASTATIN CALCIUM 10 MG PO TABS: 10.0000 mg | ORAL_TABLET | Freq: Every day | ORAL | 3 refills | Status: DC

## 2022-11-20 NOTE — Assessment & Plan Note (Signed)
Tolerating statin well.  RF x 1 yr.

## 2022-11-20 NOTE — Assessment & Plan Note (Signed)
1C up just a little bit at 6.1 but still at goal so continue current regimen continue to work on healthy diet.  Will call for her recent eye exam that was done at [facility] in [facility].

## 2022-11-20 NOTE — Assessment & Plan Note (Signed)
Follow kidney function Q 6 months. Due in Dec

## 2022-11-20 NOTE — Assessment & Plan Note (Signed)
Continue to follow renal function every 6 months she will be due for repeat check in December.

## 2022-11-20 NOTE — Assessment & Plan Note (Signed)
Well controlled. Continue current regimen. Follow up in  6 mo  

## 2022-11-20 NOTE — Progress Notes (Signed)
Established Patient Office Visit  Subjective   Patient ID: [name], female    DOB: 1945-04-21  Age: 77 y.o. MRN: [id number]  Chief Complaint  Patient presents with   Hypertension   Diabetes    Eye exam [facility] [id number](p):[id number](f)    HPI   Diabetes - no hypoglycemic events. No wounds or sores that are not healing well. No increased thirst or urination. Checking glucose at home. Taking medications as prescribed without any side effects.  Hypertension- Pt denies chest pain, SOB, dizziness, or heart palpitations.  Taking meds as directed w/o problems.  Denies medication side effects.    She also has some skin tags around her neck that she would like removed at some point.  They get bothersome and irritated.  She also still has some significant pigmentation loss from when she had dress syndrome.  All the inflammation and bumpiness is completely resolved but she still has significant vitiligo.    ROS    Objective:     BP 128/68   Pulse 82   Ht 5\' 9"  (1.753 m)   Wt 210 lb (95.3 kg)   SpO2 100%   BMI 31.01 kg/m    Physical Exam Vitals and nursing note reviewed.  Constitutional:      Appearance: Normal appearance.  HENT:     Head: Normocephalic and atraumatic.  Eyes:     Conjunctiva/sclera: Conjunctivae normal.  Cardiovascular:     Rate and Rhythm: Normal rate and regular rhythm.  Pulmonary:     Effort: Pulmonary effort is normal.     Breath sounds: Normal breath sounds.  Skin:    General: Skin is warm and dry.  Neurological:     Mental Status: She is alert.  Psychiatric:        Mood and Affect: Mood normal.      Results for orders placed or performed in visit on 11/20/22  POCT HgB A1C  Result Value Ref Range   Hemoglobin A1C 6.1 (A) 4.0 - 5.6 %   HbA1c POC (<> result, manual entry)     HbA1c, POC (prediabetic range)     HbA1c, POC (controlled diabetic range)        The 10-year ASCVD risk score (Arnett DK, et al., 2019) is:  39.9%    Assessment & Plan:   Problem List Items Addressed This Visit       Cardiovascular and Mediastinum   HYPERTENSION, BENIGN - Primary    Well controlled. Continue current regimen. Follow up in  19mo       Relevant Medications   rosuvastatin (CRESTOR) 10 MG tablet   Other Relevant Orders   CMP14+EGFR     Endocrine   Controlled diabetes mellitus type 2 with complications (HCC)    1C up just a little bit at 6.1 but still at goal so continue current regimen continue to work on healthy diet.  Will call for her recent eye exam that was done at [facility] in [facility].      Relevant Medications   rosuvastatin (CRESTOR) 10 MG tablet   Other Relevant Orders   POCT HgB A1C (Completed)   CMP14+EGFR   CKD stage 3 due to type 2 diabetes mellitus (HCC)    Continue to follow renal function every 6 months she will be due for repeat check in December.      Relevant Medications   rosuvastatin (CRESTOR) 10 MG tablet     Genitourinary   Chronic kidney disease (CKD) stage  G3b/A1, moderately decreased glomerular filtration rate (GFR) between 30-44 mL/min/1.73 square meter and albuminuria creatinine ratio less than 30 mg/g (HCC)    Follow kidney function Q 6 months. Due in Dec        Other   Hyperlipidemia    Tolerating statin well.  RF x 1 yr.       Relevant Medications   rosuvastatin (CRESTOR) 10 MG tablet   Other Visit Diagnoses     Encounter for immunization       Relevant Orders   Flu Vaccine Trivalent High Dose (Fluad) (Completed)   Multiple acquired skin tags       Vitiligo       Relevant Medications   tacrolimus (PROTOPIC) 0.1 % ointment       Skin tags -  happy to schedule for removal at her convenience.  Return in about 4 months (around 03/22/2023) for Diabetes follow-up, Hypertension.   I spent 35 minutes on the day of the encounter to include pre-visit record review, face-to-face time with the patient and post visit ordering of  test.   Nani Gasser, MD

## 2022-11-20 NOTE — Patient Instructions (Addendum)
Please schedule a nurse visit in December for Prevnar 20 (pneumonia vaccine) as well as labs.

## 2022-11-21 LAB — CMP14+EGFR
ALT: 20 IU/L (ref 0–32)
AST: 29 IU/L (ref 0–40)
Albumin: 4.5 g/dL (ref 3.8–4.8)
Alkaline Phosphatase: 87 IU/L (ref 44–121)
BUN/Creatinine Ratio: 24 (ref 12–28)
BUN: 25 mg/dL (ref 8–27)
Bilirubin Total: 0.4 mg/dL (ref 0.0–1.2)
CO2: 27 mmol/L (ref 20–29)
Calcium: 9.9 mg/dL (ref 8.7–10.3)
Chloride: 96 mmol/L (ref 96–106)
Creatinine, Ser: 1.04 mg/dL — ABNORMAL HIGH (ref 0.57–1.00)
Globulin, Total: 3 g/dL (ref 1.5–4.5)
Glucose: 103 mg/dL — ABNORMAL HIGH (ref 70–99)
Potassium: 3.9 mmol/L (ref 3.5–5.2)
Sodium: 140 mmol/L (ref 134–144)
Total Protein: 7.5 g/dL (ref 6.0–8.5)
eGFR: 55 mL/min/{1.73_m2} — ABNORMAL LOW (ref >59)

## 2022-11-21 NOTE — Progress Notes (Signed)
Liver looks better!!! Kidney function is stable.

## 2023-01-01 ENCOUNTER — Ambulatory Visit (HOSPITAL_BASED_OUTPATIENT_CLINIC_OR_DEPARTMENT_OTHER)
Admission: RE | Admit: 2023-01-01 | Discharge: 2023-01-01 | Disposition: A | Discharge status: Home / Self Care | Payer: Medicare Other | Source: Ambulatory Visit | Attending: Nurse Practitioner | Admitting: Nurse Practitioner

## 2023-01-01 DIAGNOSIS — Z780 Asymptomatic menopausal state: Secondary | ICD-10-CM | POA: Insufficient documentation

## 2023-01-01 DIAGNOSIS — Z13820 Encounter for screening for osteoporosis: Secondary | ICD-10-CM | POA: Insufficient documentation

## 2023-01-28 ENCOUNTER — Ambulatory Visit: Payer: Medicare Other | Admitting: Nurse Practitioner

## 2023-01-28 ENCOUNTER — Encounter: Payer: Self-pay | Admitting: Nurse Practitioner

## 2023-01-28 NOTE — Progress Notes (Unsigned)
   [name] 1945/10/21 [id number]   History:  77 y.o. G0 presents for breast and pelvic exam without GYN complaints. Postmenopausal - no HRT. S/P TAH for fibroids. Normal pap and mammogram history. HTN, HLD managed by PCP.   Gynecologic History No LMP recorded. Patient has had a hysterectomy.   Contraception/Family planning: status post hysterectomy  Health Maintenance Last Pap: 2012. Results were: Normal Last mammogram: 03/22/2022. Results were: Normal Last colonoscopy: 12/06/2011. Results were: Normal, 10-year recall Last Dexa: 01/01/2023. Results were: Normal, 5-year recall  Past medical history, past surgical history, family history and social history were all reviewed and documented in the EPIC chart. Married. Retired. Sister and 2 nieces with history of breast cancer.   ROS:  A ROS was performed and pertinent positives and negatives are included.  Exam:  There were no vitals filed for this visit.   There is no height or weight on file to calculate BMI.  General appearance:  Normal Thyroid:  Symmetrical, normal in size, without palpable masses or nodularity. Respiratory  Auscultation:  Clear without wheezing or rhonchi Cardiovascular  Auscultation:  Regular rate, without rubs, murmurs or gallops  Edema/varicosities:  Not grossly evident Abdominal  Soft,nontender, without masses, guarding or rebound.  Liver/spleen:  No organomegaly noted  Hernia:  None appreciated  Skin  Inspection:  Grossly normal   Breasts: Examined lying and sitting.   Right: Without masses, retractions, discharge or axillary adenopathy.   Left: Without masses, retractions, discharge or axillary adenopathy. Pelvic: External genitalia:  no lesions              Urethra:  normal appearing urethra with no masses, tenderness or lesions              Bartholins and Skenes: normal                 Vagina: normal appearing vagina with normal color and discharge, no lesions              Cervix:  absent Bimanual Exam:  Uterus:  absent              Adnexa: no mass, fullness, tenderness              Rectovaginal: Deferred              Anus:  normal, no lesions   Patient informed chaperone available to be present for breast and pelvic exam. Patient has requested no chaperone to be present. Patient has been advised what will be completed during breast and pelvic exam.   Assessment/Plan:  77 y.o. G0 for breast and pelvic exam.   Well female exam with routine gynecological exam - Education provided on SBEs, importance of preventative screenings, current guidelines, high calcium diet, regular exercise, and multivitamin daily. Labs with PCP.  Postmenopausal - no HRT. S/P TAH for fibroids.   Screening for cervical cancer - Normal Pap history. No longer screening per guidelines.   Screening for breast cancer - Normal mammogram history.  Continue annual screenings.  Normal breast exam today.  Screening for colon cancer - 2013 colonoscopy. Will repeat at GI's recommended interval.   Screening for osteoporosis - Normal bone density 12/2022. Will repeat at 5-year interval per recommendation.   No follow-ups on file.       [name] [facility], 10:02 AM 01/28/2023

## 2023-02-15 ENCOUNTER — Telehealth: Payer: Self-pay

## 2023-02-15 NOTE — Telephone Encounter (Signed)
ersions: 1. Agapito Games, MD (Physician) at 11/20/2022 10:18 AM - Signed   Please schedule a nurse visit in December for Prevnar 20 (pneumonia vaccine) as well as labs.     Patient scheduled for prevnar 20- nurse visit but in office visit notes states as well as labs.  What lab work would you like for the patient to have?

## 2023-02-18 ENCOUNTER — Ambulatory Visit (INDEPENDENT_AMBULATORY_CARE_PROVIDER_SITE_OTHER): Payer: Medicare Other

## 2023-02-18 ENCOUNTER — Other Ambulatory Visit: Payer: Self-pay

## 2023-02-18 VITALS — BP 134/59 | HR 76 | Temp 98.4°F | Ht 69.0 in

## 2023-02-18 DIAGNOSIS — E785 Hyperlipidemia, unspecified: Secondary | ICD-10-CM | POA: Diagnosis not present

## 2023-02-18 DIAGNOSIS — Z23 Encounter for immunization: Secondary | ICD-10-CM

## 2023-02-18 DIAGNOSIS — E1169 Type 2 diabetes mellitus with other specified complication: Secondary | ICD-10-CM

## 2023-02-18 NOTE — Progress Notes (Signed)
   Established Patient Office Visit  Subjective   Patient ID: [name], female    DOB: 19-Aug-1945  Age: 77 y.o. MRN: [id number]  Chief Complaint  Patient presents with   prevnar 20 vaccine and lab work.     HPI  Prevnar 20 vaccine and lab work. Patient denies any gout flares since last appt . She did eat a banana before appointment this morning. . Patient shown two pneumonia vaccines in immunization tab- reviewed by Dr. Linford Arnold- instructed to given Prevnar 20mg  .   ROS    Objective:     BP (!) 134/59 (BP Location: Left Arm, Patient Position: Sitting, Cuff Size: Large)   Pulse 76   Temp 98.4 F (36.9 C) (Temporal)   Ht 5\' 9"  (1.753 m)   SpO2 100%   BMI 31.01 kg/m    Physical Exam   No results found for any visits on 02/18/23.    The 10-year ASCVD risk score (Arnett DK, et al., 2019) is: 41.8%    Assessment & Plan:  Prevnar 20 vaccine administered left deltoid  without complications.  Problem List Items Addressed This Visit   None   No follow-ups on file.    Elizabeth Palau, LPN

## 2023-02-19 LAB — LIPID PANEL
Chol/HDL Ratio: 2.6 {ratio} (ref 0.0–4.4)
Cholesterol, Total: 115 mg/dL (ref 100–199)
HDL: 44 mg/dL (ref >39)
LDL Chol Calc (NIH): 49 mg/dL (ref 0–99)
Triglycerides: 121 mg/dL (ref 0–149)
VLDL Cholesterol Cal: 22 mg/dL (ref 5–40)

## 2023-02-19 LAB — CMP14+EGFR
ALT: 17 [IU]/L (ref 0–32)
AST: 27 [IU]/L (ref 0–40)
Albumin: 4.5 g/dL (ref 3.8–4.8)
Alkaline Phosphatase: 75 [IU]/L (ref 44–121)
BUN/Creatinine Ratio: 15 (ref 12–28)
BUN: 15 mg/dL (ref 8–27)
Bilirubin Total: 0.5 mg/dL (ref 0.0–1.2)
CO2: 25 mmol/L (ref 20–29)
Calcium: 9.6 mg/dL (ref 8.7–10.3)
Chloride: 98 mmol/L (ref 96–106)
Creatinine, Ser: 1.03 mg/dL — ABNORMAL HIGH (ref 0.57–1.00)
Globulin, Total: 2.5 g/dL (ref 1.5–4.5)
Glucose: 99 mg/dL (ref 70–99)
Potassium: 3.9 mmol/L (ref 3.5–5.2)
Sodium: 139 mmol/L (ref 134–144)
Total Protein: 7 g/dL (ref 6.0–8.5)
eGFR: 56 mL/min/{1.73_m2} — ABNORMAL LOW (ref >59)

## 2023-02-21 NOTE — Progress Notes (Signed)
Hi [name], kidney function is stable at 1.0.  Liver enzymes look good this time.  Cholesterol looks great and is at goal.  Continue current regimen.  Let us know when you had your last eye exam so we can get your chart updated.

## 2023-02-28 ENCOUNTER — Other Ambulatory Visit: Payer: Self-pay | Admitting: Family Medicine

## 2023-02-28 DIAGNOSIS — Z1231 Encounter for screening mammogram for malignant neoplasm of breast: Secondary | ICD-10-CM

## 2023-02-28 IMAGING — DX DG KNEE COMPLETE 4+V*R*
5 series · 5 of 5 positions shown · non-contrast
Comparison: Bilateral knee x-rays 12/18/2017.

CLINICAL DATA: Recent falls.  Increasing pain.

EXAM:
RIGHT KNEE - COMPLETE 4+ VIEW; LEFT KNEE - 1-2 VIEW

[knee tunnel (1 of 2)]
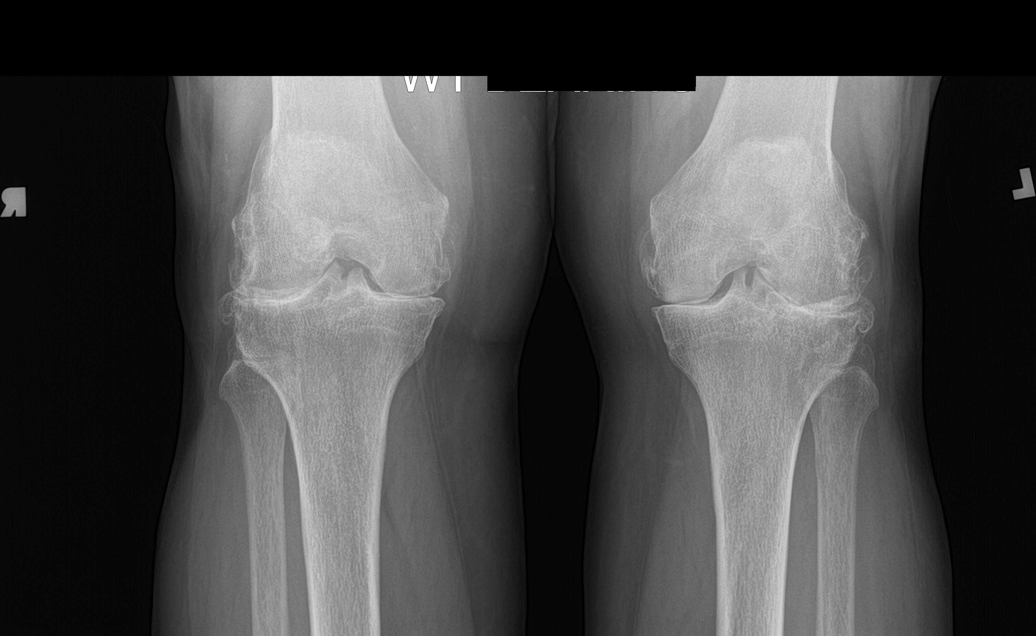

[knee lat]
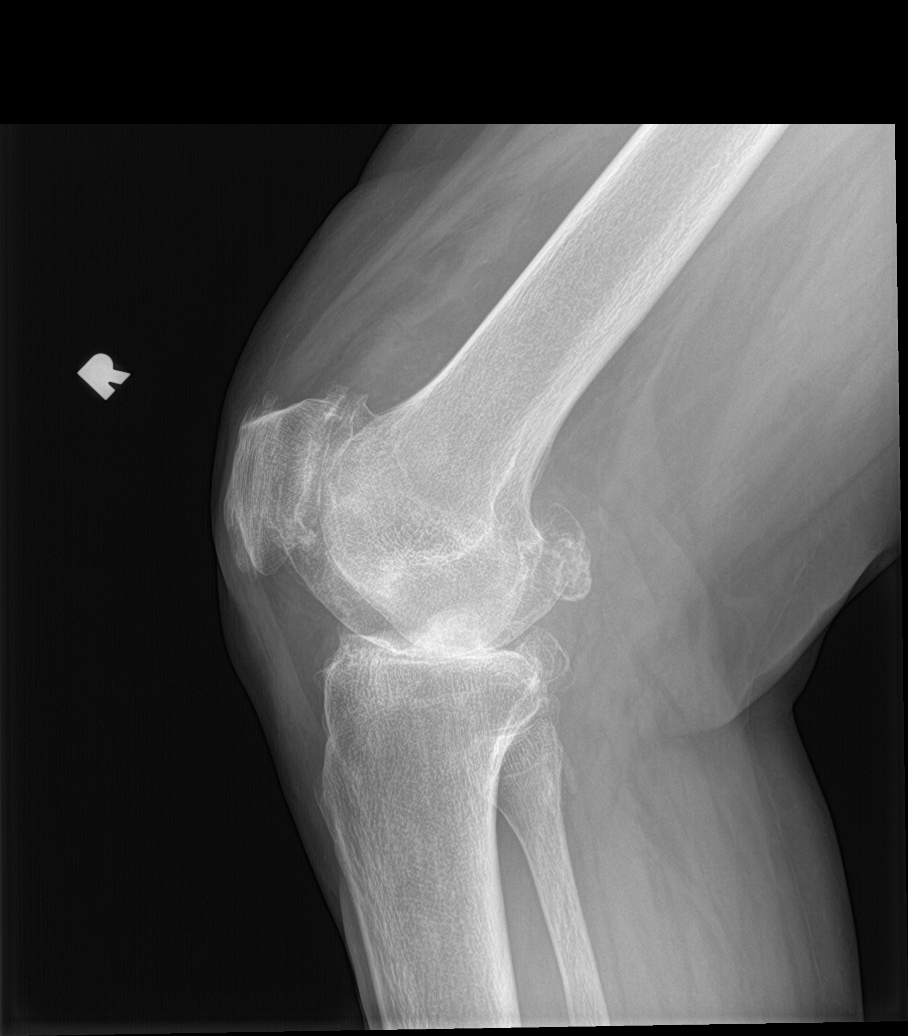

[knee ap bilat standing]
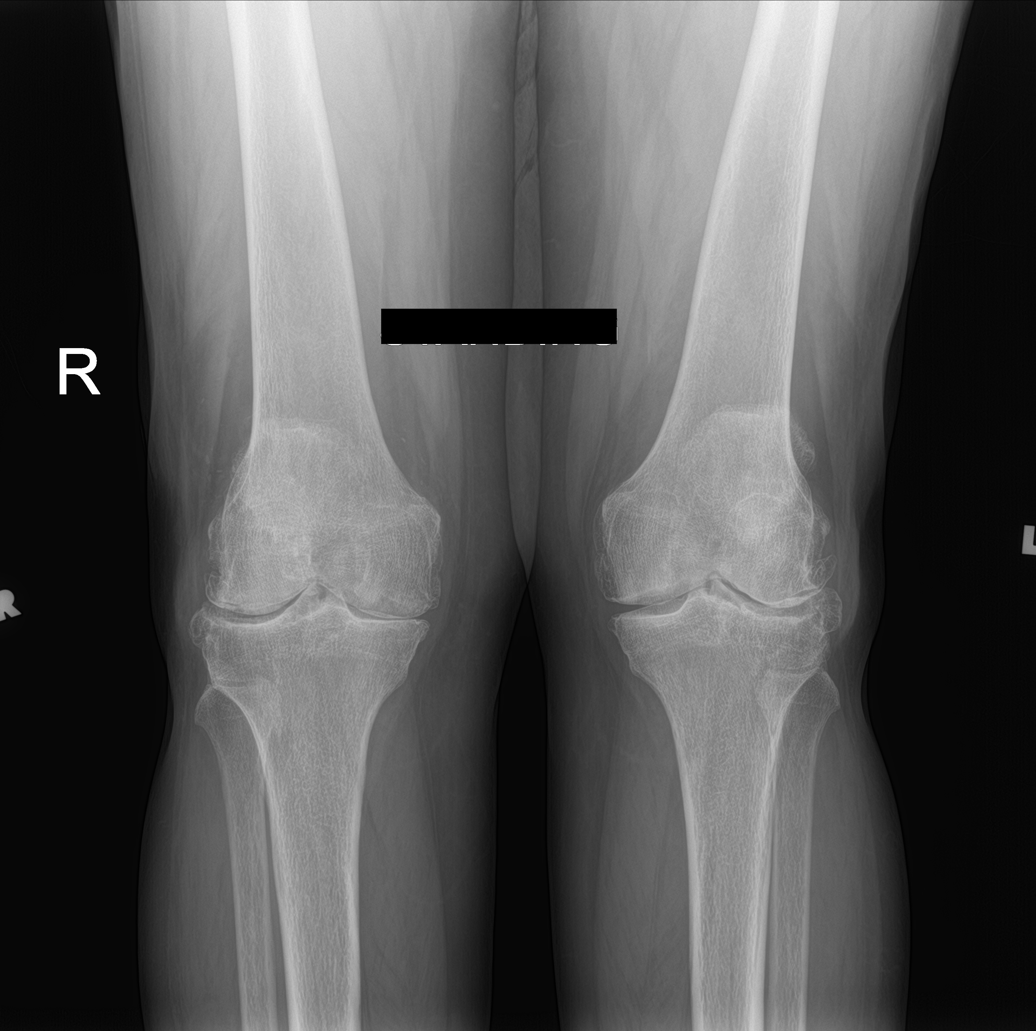

[knee sunrise standing]
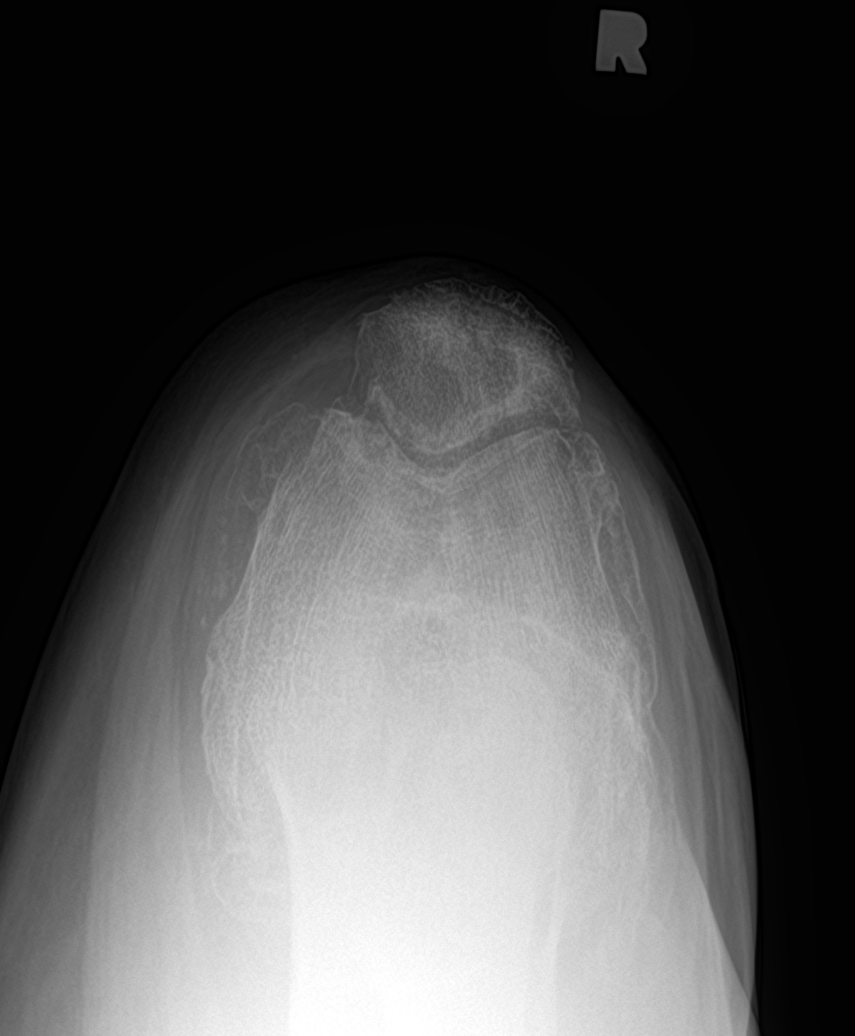

[knee tunnel (2 of 2)]
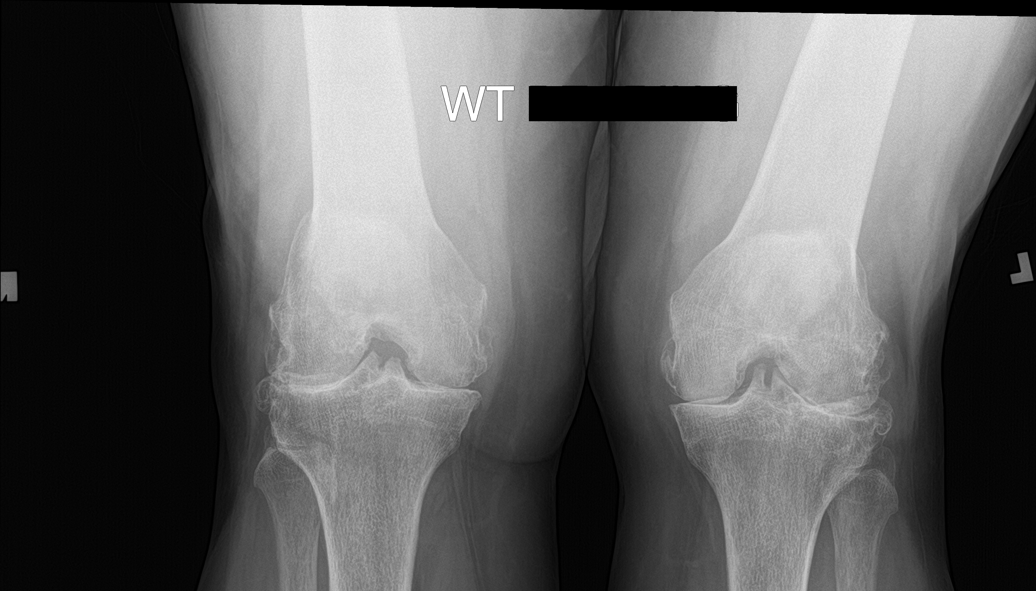

[5 of 5 positions shown; findings below may reference images not displayed]

FINDINGS: Right knee:

There is no acute fracture or dislocation. There is severe medial
and moderate lateral joint space narrowing. Mediolateral compartment
osteophytes are present. This is similar to the prior examination.
There is spurring of the tibial spines, unchanged. There is also
moderate severe patellofemoral compartment joint space narrowing
with osteophyte formation. Small joint effusion is present. Soft
tissues are otherwise within normal limits. With weight-bearing,
there is severe medial and lateral compartment joint space
narrowing.

Left knee:

There is no acute fracture or dislocation. There is moderate severe
lateral compartment and moderate medial compartment joint space
narrowing with osteophyte formation which is similar to the prior
study. There is stable spurring of the tibial spines. With
weight-bearing, there is severe medial and lateral compartment joint
space narrowing.
IMPRESSION: 1. No acute fracture or dislocation of the bilateral knees.
2. There are moderate severe degenerative changes of the knees
similar to the prior study. Note is made with weight-bearing, there
is severe/progressive medial and lateral compartment joint space
narrowing.

## 2023-02-28 IMAGING — DX DG KNEE 1-2V*L*
3 series · 3 of 3 positions shown · non-contrast
Comparison: Bilateral knee x-rays 12/18/2017.

CLINICAL DATA: Recent falls.  Increasing pain.

EXAM:
RIGHT KNEE - COMPLETE 4+ VIEW; LEFT KNEE - 1-2 VIEW

[knee tunnel (1 of 2)]
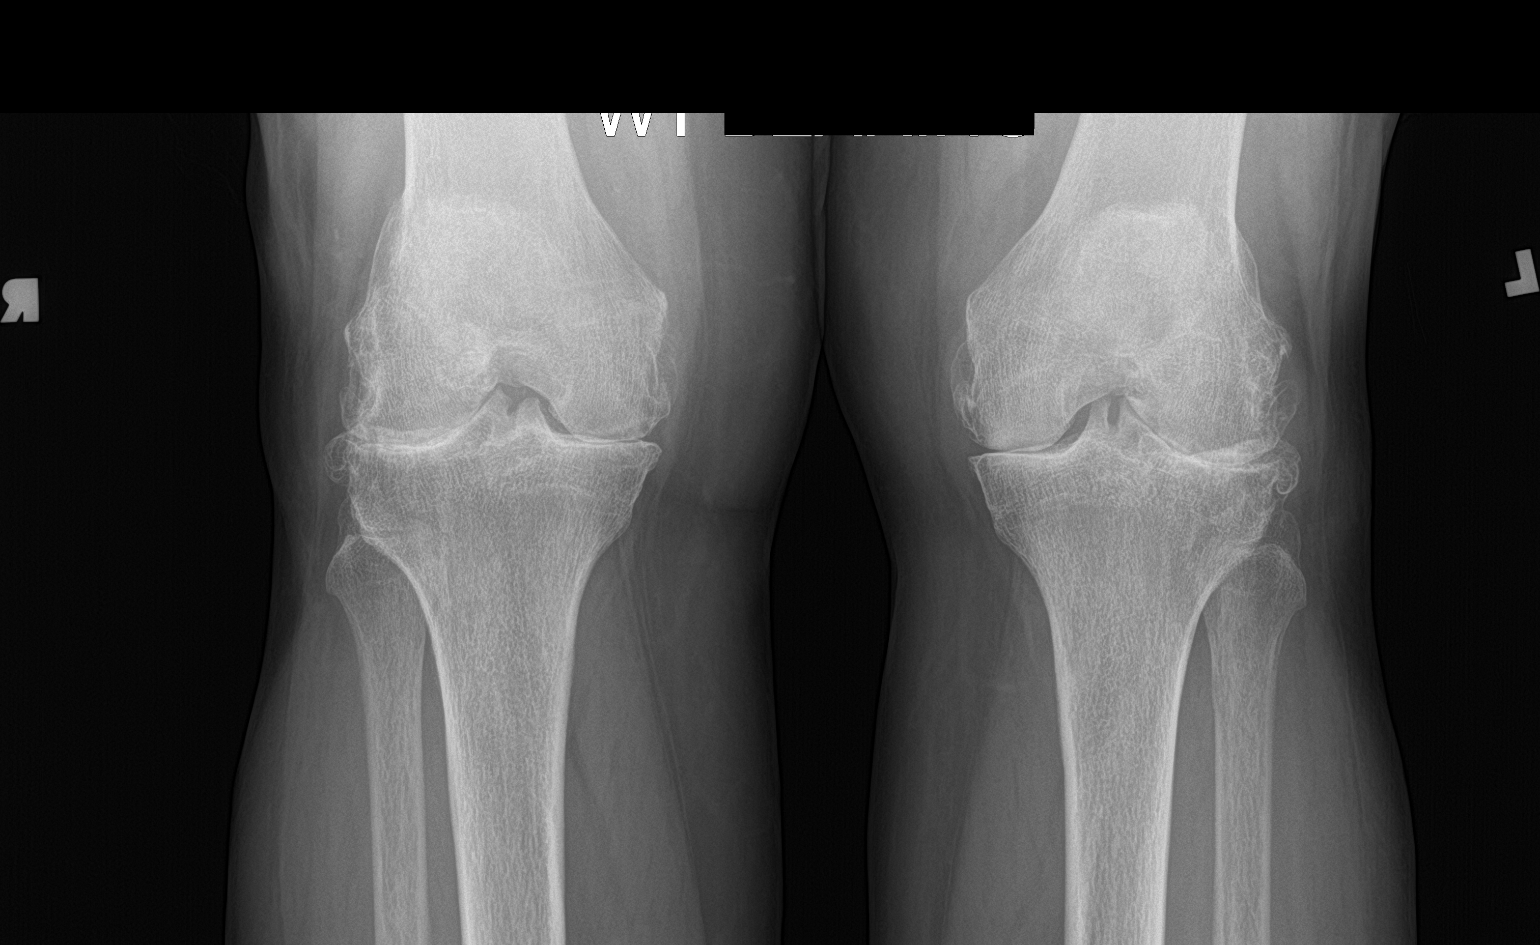

[knee ap bilat standing]
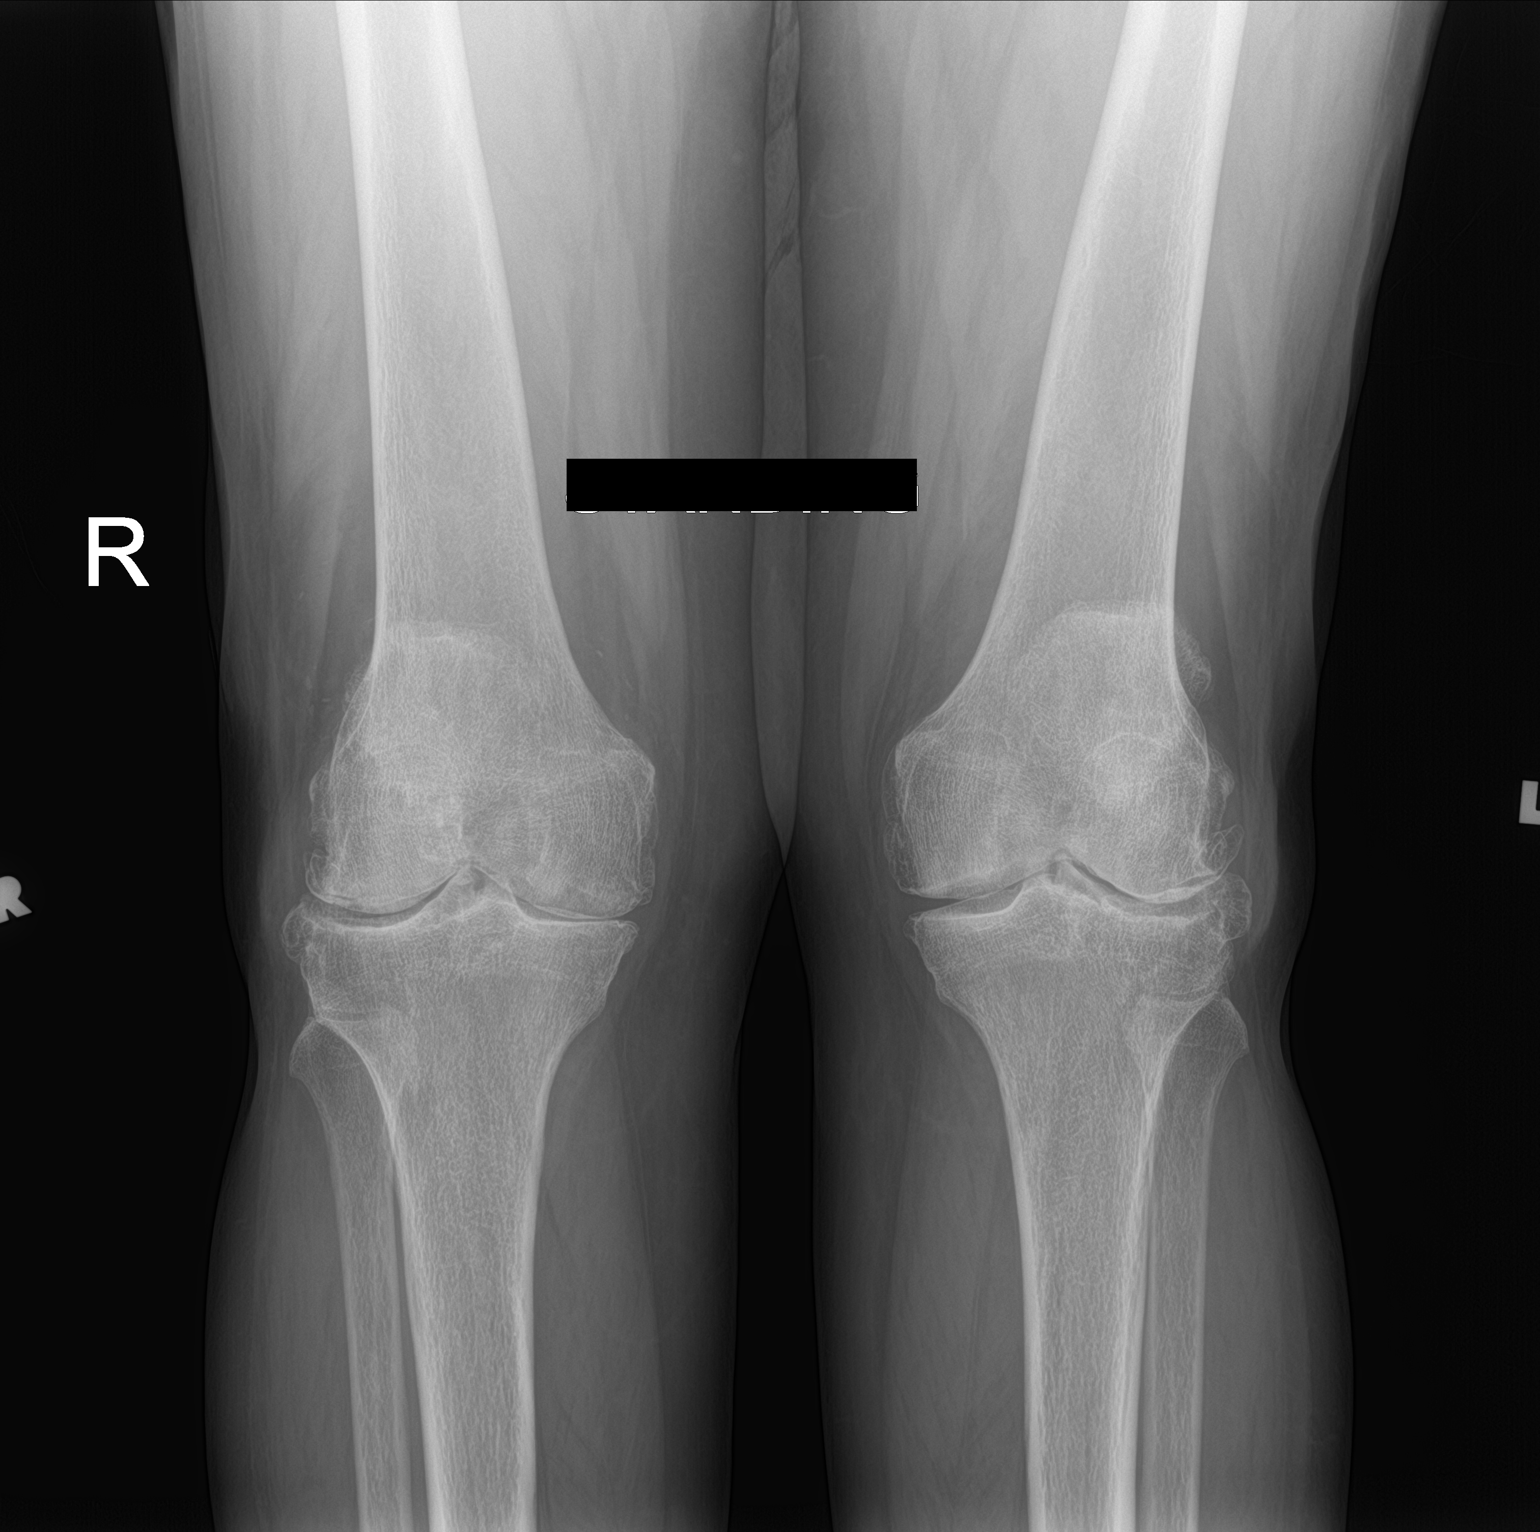

[knee tunnel (2 of 2)]
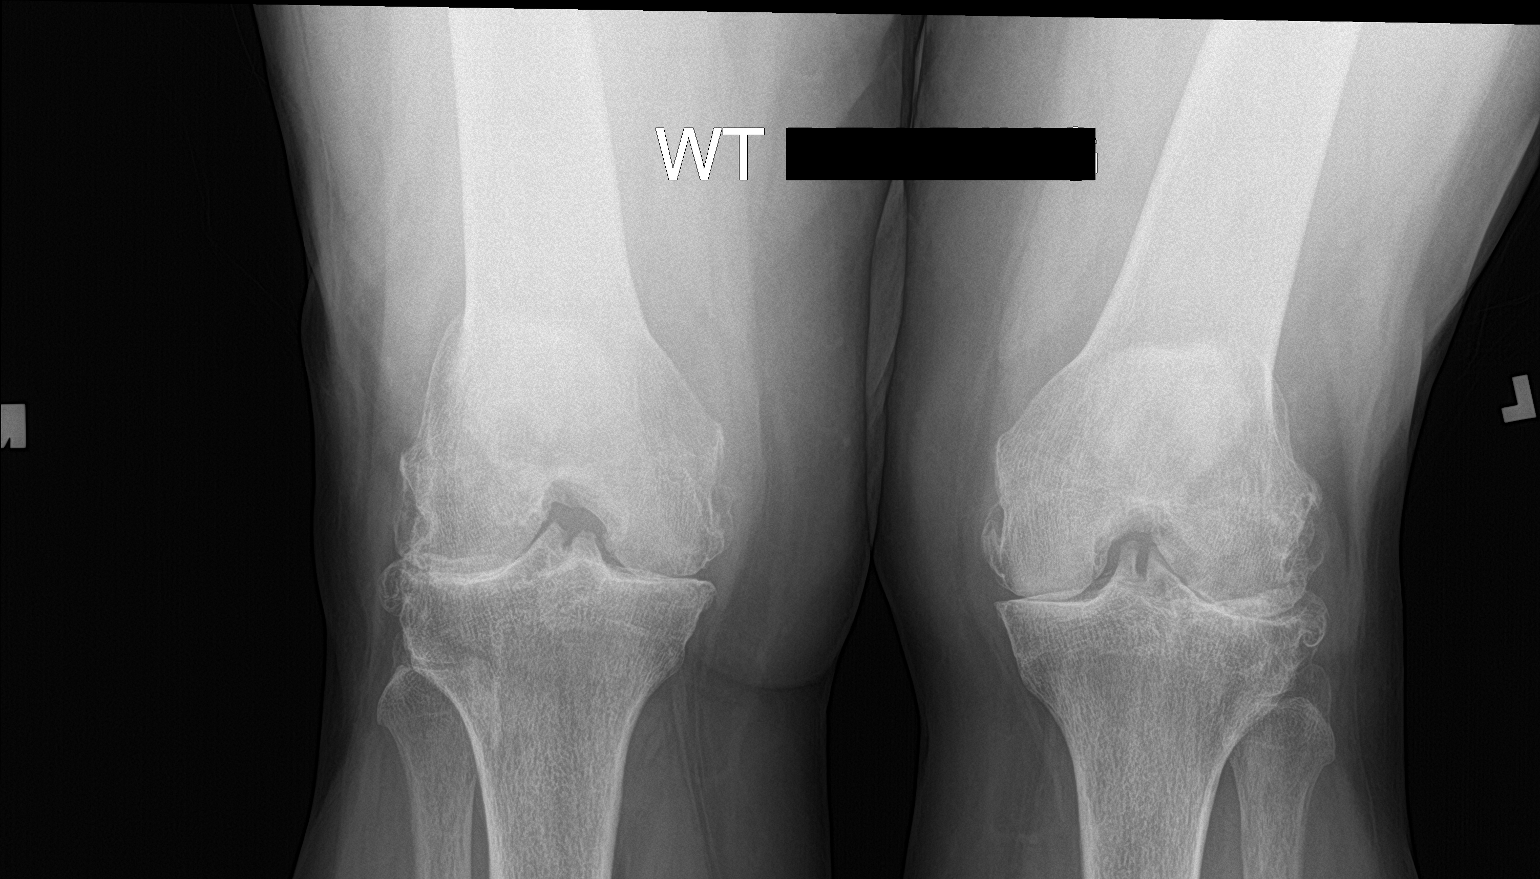

[3 of 3 positions shown; findings below may reference images not displayed]

FINDINGS: Right knee:

There is no acute fracture or dislocation. There is severe medial
and moderate lateral joint space narrowing. Mediolateral compartment
osteophytes are present. This is similar to the prior examination.
There is spurring of the tibial spines, unchanged. There is also
moderate severe patellofemoral compartment joint space narrowing
with osteophyte formation. Small joint effusion is present. Soft
tissues are otherwise within normal limits. With weight-bearing,
there is severe medial and lateral compartment joint space
narrowing.

Left knee:

There is no acute fracture or dislocation. There is moderate severe
lateral compartment and moderate medial compartment joint space
narrowing with osteophyte formation which is similar to the prior
study. There is stable spurring of the tibial spines. With
weight-bearing, there is severe medial and lateral compartment joint
space narrowing.
IMPRESSION: 1. No acute fracture or dislocation of the bilateral knees.
2. There are moderate severe degenerative changes of the knees
similar to the prior study. Note is made with weight-bearing, there
is severe/progressive medial and lateral compartment joint space
narrowing.

## 2023-03-01 ENCOUNTER — Other Ambulatory Visit: Payer: Self-pay | Admitting: Family Medicine

## 2023-03-01 DIAGNOSIS — I10 Essential (primary) hypertension: Secondary | ICD-10-CM

## 2023-03-25 ENCOUNTER — Ambulatory Visit
Admission: RE | Admit: 2023-03-25 | Discharge: 2023-03-25 | Disposition: A | Discharge status: Home / Self Care | Payer: Medicare Other | Source: Ambulatory Visit | Attending: Family Medicine | Admitting: Family Medicine

## 2023-03-25 ENCOUNTER — Ambulatory Visit (INDEPENDENT_AMBULATORY_CARE_PROVIDER_SITE_OTHER): Payer: Medicare Other | Admitting: Family Medicine

## 2023-03-25 ENCOUNTER — Encounter: Payer: Self-pay | Admitting: Family Medicine

## 2023-03-25 VITALS — BP 126/72 | HR 80 | Ht 69.0 in | Wt 209.0 lb

## 2023-03-25 DIAGNOSIS — Z1231 Encounter for screening mammogram for malignant neoplasm of breast: Secondary | ICD-10-CM

## 2023-03-25 DIAGNOSIS — E1122 Type 2 diabetes mellitus with diabetic chronic kidney disease: Secondary | ICD-10-CM | POA: Diagnosis not present

## 2023-03-25 DIAGNOSIS — Z1211 Encounter for screening for malignant neoplasm of colon: Secondary | ICD-10-CM | POA: Diagnosis not present

## 2023-03-25 DIAGNOSIS — N183 Chronic kidney disease, stage 3 unspecified: Secondary | ICD-10-CM

## 2023-03-25 DIAGNOSIS — E118 Type 2 diabetes mellitus with unspecified complications: Secondary | ICD-10-CM

## 2023-03-25 DIAGNOSIS — E1169 Type 2 diabetes mellitus with other specified complication: Secondary | ICD-10-CM

## 2023-03-25 DIAGNOSIS — N1832 Chronic kidney disease, stage 3b: Secondary | ICD-10-CM

## 2023-03-25 DIAGNOSIS — I10 Essential (primary) hypertension: Secondary | ICD-10-CM

## 2023-03-25 LAB — POCT GLYCOSYLATED HEMOGLOBIN (HGB A1C): Hemoglobin A1C: 5.7 % — AB (ref 4.0–5.6)

## 2023-03-25 NOTE — Assessment & Plan Note (Signed)
Labs are up to date. Due to check urine micro.

## 2023-03-25 NOTE — Assessment & Plan Note (Signed)
Continue to follow every 6 months.

## 2023-03-25 NOTE — Assessment & Plan Note (Signed)
Well controlled. Continue current regimen. Follow up in  6 mo

## 2023-03-25 NOTE — Progress Notes (Signed)
Established Patient Office Visit  Subjective  Patient ID: [name], female    DOB: 1945/12/11  Age: 78 y.o. MRN: [id number]  Chief Complaint  Patient presents with   Diabetes   Hypertension    HPI  Diabetes - no hypoglycemic events. No wounds or sores that are not healing well. No increased thirst or urination. Checking glucose at home. Taking medications as prescribed without any side effects.  Hypertension- Pt denies chest pain, SOB, dizziness, or heart palpitations.  Taking meds as directed w/o problems.  Denies medication side effects.    Her mammogram is scheduled for this afternoon and she has her yearly follow-up with OB/GYN next month.  She did get a house call from her insurance company for an evaluation.  She thinks she is due for repeat colonoscopy and would like to get that scheduled if appropriate.    ROS    Objective:     BP 126/72   Pulse 80   Ht 5\' 9"  (1.753 m)   Wt 209 lb (94.8 kg)   SpO2 97%   BMI 30.86 kg/m    Physical Exam Vitals and nursing note reviewed.  Constitutional:      Appearance: Normal appearance.  HENT:     Head: Normocephalic and atraumatic.  Eyes:     Conjunctiva/sclera: Conjunctivae normal.  Cardiovascular:     Rate and Rhythm: Normal rate and regular rhythm.  Pulmonary:     Effort: Pulmonary effort is normal.     Breath sounds: Normal breath sounds.  Skin:    General: Skin is warm and dry.  Neurological:     Mental Status: She is alert.  Psychiatric:        Mood and Affect: Mood normal.      Results for orders placed or performed in visit on 03/25/23  POCT HgB A1C  Result Value Ref Range   Hemoglobin A1C 5.7 (A) 4.0 - 5.6 %   HbA1c POC (<> result, manual entry)     HbA1c, POC (prediabetic range)     HbA1c, POC (controlled diabetic range)        The ASCVD Risk score (Arnett DK, et al., 2019) failed to calculate for the following reasons:   The valid total cholesterol range is 130 to 320 mg/dL     Assessment & Plan:   Problem List Items Addressed This Visit       Cardiovascular and Mediastinum   HYPERTENSION, BENIGN   Well controlled. Continue current regimen. Follow up in  6 mo         Endocrine   Controlled diabetes mellitus type 2 with complications (HCC)   CKD stage 3 due to type 2 diabetes mellitus (HCC)   Labs are up to date. Due to check urine micro.         Genitourinary   Chronic kidney disease (CKD) stage G3b/A1, moderately decreased glomerular filtration rate (GFR) between 30-44 mL/min/1.73 square meter and albuminuria creatinine ratio less than 30 mg/g (HCC)   Continue to follow every 6 months.       Other Visit Diagnoses       Controlled type 2 diabetes mellitus with other specified complication, without long-term current use of insulin (HCC)    -  Primary   Relevant Orders   POCT HgB A1C (Completed)     Screen for colon cancer       Relevant Orders   Ambulatory referral to Gastroenterology      Not sure if she  truly needs another repeat colonoscopy but organ to go ahead and send a note over to GI.  Return in about 4 months (around 07/30/2023) for Diabetes follow-up, Hypertension.    Nani Gasser, MD

## 2023-03-27 ENCOUNTER — Encounter: Payer: Self-pay | Admitting: Family Medicine

## 2023-03-27 NOTE — Progress Notes (Signed)
Please call patient. Normal mammogram.  Repeat in 1 year.

## 2023-03-30 ENCOUNTER — Other Ambulatory Visit: Payer: Self-pay | Admitting: Family Medicine

## 2023-03-30 DIAGNOSIS — I10 Essential (primary) hypertension: Secondary | ICD-10-CM

## 2023-04-01 ENCOUNTER — Ambulatory Visit (INDEPENDENT_AMBULATORY_CARE_PROVIDER_SITE_OTHER): Payer: Medicare Other | Admitting: Nurse Practitioner

## 2023-04-01 ENCOUNTER — Encounter: Payer: Self-pay | Admitting: Nurse Practitioner

## 2023-04-01 VITALS — BP 138/62 | HR 80 | Ht 68.5 in | Wt 211.0 lb

## 2023-04-01 DIAGNOSIS — Z01419 Encounter for gynecological examination (general) (routine) without abnormal findings: Secondary | ICD-10-CM

## 2023-04-01 DIAGNOSIS — Z9189 Other specified personal risk factors, not elsewhere classified: Secondary | ICD-10-CM | POA: Diagnosis not present

## 2023-04-01 DIAGNOSIS — Z780 Asymptomatic menopausal state: Secondary | ICD-10-CM

## 2023-04-01 NOTE — Progress Notes (Signed)
   [name] 02-28-1945 [id number]   History:  78 y.o. G0 presents for breast and pelvic exam without GYN complaints. Postmenopausal - no HRT. S/P TAH for fibroids. Normal pap and mammogram history. HTN, HLD managed by PCP.   Gynecologic History No LMP recorded. Patient has had a hysterectomy.   Contraception/Family planning: status post hysterectomy Sexually active: No  Health Maintenance Last Pap: 2012. Results were: Normal Last mammogram: 03/25/2023. Results were: Normal Last colonoscopy: 12/06/2011. Results were: Normal, 10-year recall Last Dexa: 01/01/2023. Results were: Normal, 5-year recall Exercising: Yes. strengthening  Smoker: no   Past medical history, past surgical history, family history and social history were all reviewed and documented in the EPIC chart. Married. Retired. Sister and 2 nieces with history of breast cancer.   ROS:  A ROS was performed and pertinent positives and negatives are included.  Exam:  Vitals:   04/01/23 1036  BP: 138/62  Pulse: 80  SpO2: 98%  Weight: 211 lb (95.7 kg)  Height: 5' 8.5" (1.74 m)     Body mass index is 31.61 kg/m.  General appearance:  Normal Thyroid:  Symmetrical, normal in size, without palpable masses or nodularity. Respiratory  Auscultation:  Clear without wheezing or rhonchi Cardiovascular  Auscultation:  Regular rate, without rubs, murmurs or gallops  Edema/varicosities:  Not grossly evident Abdominal  Soft,nontender, without masses, guarding or rebound.  Liver/spleen:  No organomegaly noted  Hernia:  None appreciated  Skin  Inspection:  Grossly normal   Breasts: Examined lying and sitting.   Right: Without masses, retractions, discharge or axillary adenopathy.   Left: Without masses, retractions, discharge or axillary adenopathy. Genitourinary   Inguinal/mons:  Normal without inguinal adenopathy  External genitalia:  Normal appearing vulva with no masses, tenderness, or  lesions  BUS/Urethra/Skene's glands:  Normal  Vagina:  Normal appearing with normal color and discharge, no lesions. Atrophic changes  Cervix:  Absent  Uterus:  Absent  Adnexa/parametria:     Rt: Normal in size, without masses or tenderness.   Lt: Normal in size, without masses or tenderness.  Anus and perineum: Normal  Digital rectal exam: Deferred  Patient informed chaperone available to be present for breast and pelvic exam. Patient has requested no chaperone to be present. Patient has been advised what will be completed during breast and pelvic exam.   Assessment/Plan:  78 y.o. G0 for breast and pelvic exam.   Well female exam with routine gynecological exam - Education provided on SBEs, importance of preventative screenings, current guidelines, high calcium diet, regular exercise, and multivitamin daily. Labs with PCP.  Postmenopausal - no HRT. S/P TAH for fibroids.   Screening for cervical cancer - Normal Pap history. No longer screening per guidelines.   Screening for breast cancer - Normal mammogram history.  Continue annual screenings.  Normal breast exam today.  Screening for colon cancer - 2013 colonoscopy. Waiting to hear back from GI regarding follow up.   Screening for osteoporosis - Normal bone density 12/2022 Will repeat at 5-year interval per recommendation.   Return in about 1 year (around 03/31/2024) for B&P (high risk).       [name] [facility], 10:58 AM 04/01/2023

## 2023-05-24 ENCOUNTER — Encounter: Payer: Self-pay | Admitting: Family Medicine

## 2023-05-24 ENCOUNTER — Ambulatory Visit (INDEPENDENT_AMBULATORY_CARE_PROVIDER_SITE_OTHER): Admitting: Family Medicine

## 2023-05-24 VITALS — BP 144/64 | HR 79 | Temp 97.6°F | Ht 69.0 in | Wt 209.2 lb

## 2023-05-24 DIAGNOSIS — J40 Bronchitis, not specified as acute or chronic: Secondary | ICD-10-CM

## 2023-05-24 MED ORDER — PREDNISONE 20 MG PO TABS: ORAL_TABLET | ORAL | 0 refills | Status: DC

## 2023-05-24 NOTE — Progress Notes (Signed)
   Acute Office Visit  Subjective:     Patient ID: SHANETRA BLUMENSTOCK, female    DOB: 08-05-1945, 78 y.o.   MRN: [id number]  Chief Complaint  Patient presents with   Cough    Cough, congestion, hoarseness in voice   x 7 days -     Cough Patient is in today for acute visit.  Cough Started last Friday. No sick contacts. She has tried Mucinex severe congestion but hasn't resolved the congestion. She has some hoarseness as a result from the excessive mucus. Denies fever, chills or feeling ill. Had some SOB when going up stairs but reports it's not 'that bad'.  Review of Systems  Respiratory:  Positive for cough.   All other systems reviewed and are negative.      Objective:    BP (!) 144/64   Pulse 79   Temp 97.6 F (36.4 C)   Ht 5\' 9"  (1.753 m)   Wt 209 lb 3 oz (94.9 kg)   SpO2 97%   BMI 30.89 kg/m    Physical Exam Vitals and nursing note reviewed.  Constitutional:      Appearance: Normal appearance. She is normal weight.  HENT:     Head: Normocephalic and atraumatic.     Right Ear: Tympanic membrane, ear canal and external ear normal.     Left Ear: Tympanic membrane, ear canal and external ear normal.     Nose: Nose normal.     Mouth/Throat:     Mouth: Mucous membranes are moist.     Pharynx: Oropharynx is clear.  Eyes:     Conjunctiva/sclera: Conjunctivae normal.     Pupils: Pupils are equal, round, and reactive to light.  Cardiovascular:     Rate and Rhythm: Normal rate and regular rhythm.     Pulses: Normal pulses.     Heart sounds: Normal heart sounds.  Pulmonary:     Effort: Pulmonary effort is normal.     Breath sounds: Normal breath sounds.  Skin:    General: Skin is warm.     Capillary Refill: Capillary refill takes less than 2 seconds.  Neurological:     General: No focal deficit present.     Mental Status: She is alert and oriented to person, place, and time. Mental status is at baseline.  Psychiatric:        Mood and Affect: Mood normal.         Behavior: Behavior normal.        Thought Content: Thought content normal.        Judgment: Judgment normal.   No results found for any visits on 05/24/23.      Assessment & Plan:   Problem List Items Addressed This Visit   None  Bronchitis -     predniSONE; Take 2 tabs daily x 3 days, then take 1 tab daily for 3 days  Dispense: 9 tablet; Refill: 0   Pt with bronchitis symptoms. Treat with Prednisone to help with congestion. No wheezing and reports SOB not bad, so will defer albuterol at this time.  No orders of the defined types were placed in this encounter.   No follow-ups on file.  Suzan Slick, MD

## 2023-05-26 ENCOUNTER — Other Ambulatory Visit: Payer: Self-pay | Admitting: Family Medicine

## 2023-05-26 DIAGNOSIS — I10 Essential (primary) hypertension: Secondary | ICD-10-CM

## 2023-06-13 NOTE — Progress Notes (Signed)
 [facility] Quality Team Note  Name: [name] Date of Birth: 12-16-1945 MRN: [id number] Date: 06/13/2023  [facility] Quality Team has reviewed this patient's chart, please see recommendations below:  [facility] Quality Other; (Patient needs urine microalbumin/creatinine ratio and eGFR completed at upcoming appt 08/05/2023)

## 2023-07-09 ENCOUNTER — Telehealth: Payer: Self-pay

## 2023-07-09 NOTE — Telephone Encounter (Signed)
 Copied from CRM [id number]. Topic: Clinical - Medical Advice >> Jul 09, 2023  3:20 PM Carlatta H wrote: Reason for CRM: Patient was seen 3/28 but still has lingering congestion//She would like a call back with advice on how to proceed//

## 2023-07-10 NOTE — Telephone Encounter (Signed)
 LVM advising pt to call office to schedule an appointment to be seen about this.

## 2023-07-10 NOTE — Telephone Encounter (Signed)
 Pt came in today w/her husband and she spoke with Dr. Greer Leak about this.

## 2023-07-20 LAB — AMB RESULTS CONSOLE CBG: Glucose: 170

## 2023-08-05 ENCOUNTER — Encounter: Payer: Self-pay | Admitting: Family Medicine

## 2023-08-05 ENCOUNTER — Ambulatory Visit (INDEPENDENT_AMBULATORY_CARE_PROVIDER_SITE_OTHER): Payer: Medicare Other | Admitting: Family Medicine

## 2023-08-05 VITALS — BP 134/61 | HR 82 | Ht 69.0 in | Wt 215.1 lb

## 2023-08-05 DIAGNOSIS — N183 Chronic kidney disease, stage 3 unspecified: Secondary | ICD-10-CM

## 2023-08-05 DIAGNOSIS — E1169 Type 2 diabetes mellitus with other specified complication: Secondary | ICD-10-CM

## 2023-08-05 DIAGNOSIS — E1122 Type 2 diabetes mellitus with diabetic chronic kidney disease: Secondary | ICD-10-CM | POA: Diagnosis not present

## 2023-08-05 DIAGNOSIS — E118 Type 2 diabetes mellitus with unspecified complications: Secondary | ICD-10-CM

## 2023-08-05 DIAGNOSIS — M109 Gout, unspecified: Secondary | ICD-10-CM

## 2023-08-05 DIAGNOSIS — I10 Essential (primary) hypertension: Secondary | ICD-10-CM | POA: Diagnosis not present

## 2023-08-05 DIAGNOSIS — Q632 Ectopic kidney: Secondary | ICD-10-CM

## 2023-08-05 DIAGNOSIS — N1832 Chronic kidney disease, stage 3b: Secondary | ICD-10-CM | POA: Diagnosis not present

## 2023-08-05 LAB — POCT GLYCOSYLATED HEMOGLOBIN (HGB A1C): Hemoglobin A1C: 5.8 % — AB (ref 4.0–5.6)

## 2023-08-05 MED ORDER — AMLODIPINE BESYLATE 10 MG PO TABS: 10.0000 mg | ORAL_TABLET | Freq: Every day | ORAL | 3 refills | Status: AC

## 2023-08-05 MED ORDER — VALSARTAN-HYDROCHLOROTHIAZIDE 320-25 MG PO TABS
1.0000 | ORAL_TABLET | Freq: Every day | ORAL | 3 refills | Status: AC
Start: 2023-08-05 — End: ?

## 2023-08-05 NOTE — Assessment & Plan Note (Signed)
 Due for urine micro today

## 2023-08-05 NOTE — Progress Notes (Signed)
 Acute Office Visit  Subjective:     Patient ID: [name], female    DOB: Nov 26, 1945, 78 y.o.   MRN: [id number]  Chief Complaint  Patient presents with   Diabetes   Hypertension         HPI Patient is in today for   Diabetes - no hypoglycemic events. No wounds or sores that are not healing well. No increased thirst or urination. Checking glucose at home. Taking medications as prescribed without any side effects.  Hypertension- Pt denies chest pain, SOB, dizziness, or heart palpitations.  Taking meds as directed w/o problems.  Denies medication side effects.  No lower extremity swelling.  She says it took her forever to get over the bronchitis that she had recently she still has an occasional cough but feels much better.  No recent chest pain.   ROS      Objective:    BP 134/61   Pulse 82   Ht 5\' 9"  (1.753 m)   Wt 215 lb 1.3 oz (97.6 kg)   SpO2 99%   BMI 31.76 kg/m    Physical Exam Vitals and nursing note reviewed.  Constitutional:      Appearance: Normal appearance.  HENT:     Head: Normocephalic and atraumatic.  Eyes:     Conjunctiva/sclera: Conjunctivae normal.  Cardiovascular:     Rate and Rhythm: Normal rate and regular rhythm.     Heart sounds: Murmur heard.     Comments: 3 out of 6 systolic murmur best heard at the left sternal border. Pulmonary:     Effort: Pulmonary effort is normal.     Breath sounds: Normal breath sounds.  Skin:    General: Skin is warm and dry.  Neurological:     Mental Status: She is alert.  Psychiatric:        Mood and Affect: Mood normal.     Results for orders placed or performed in visit on 08/05/23  POCT HgB A1C  Result Value Ref Range   Hemoglobin A1C 5.8 (A) 4.0 - 5.6 %   HbA1c POC (<> result, manual entry)     HbA1c, POC (prediabetic range)     HbA1c, POC (controlled diabetic range)          Assessment & Plan:   Problem List Items Addressed This Visit       Cardiovascular and Mediastinum    HYPERTENSION, BENIGN   Well controlled. Continue current regimen. Follow up in  61mo       Relevant Medications   amLODipine  (NORVASC ) 10 MG tablet   valsartan -hydrochlorothiazide  (DIOVAN -HCT) 320-25 MG tablet   Other Relevant Orders   CMP14+EGFR   Uric acid     Endocrine   Controlled diabetes mellitus type 2 with complications (HCC)   A1c looks great today at 5.8 not currently on medications she is on an ARB and a statin.  Continue to work on healthy diet and regular exercise.      Relevant Medications   valsartan -hydrochlorothiazide  (DIOVAN -HCT) 320-25 MG tablet   CKD stage 3 due to type 2 diabetes mellitus (HCC)   Due for urine micro today      Relevant Medications   valsartan -hydrochlorothiazide  (DIOVAN -HCT) 320-25 MG tablet   Other Relevant Orders   Urine Microalbumin w/creat. ratio     Genitourinary   Pelvic kidney   Chronic kidney disease (CKD) stage G3b/A1, moderately decreased glomerular filtration rate (GFR) between 30-44 mL/min/1.73 square meter and albuminuria creatinine ratio less than 30 mg/g (  HCC)   Continue to follow renal function every 6 months.      Other Visit Diagnoses       Controlled type 2 diabetes mellitus with other specified complication, without long-term current use of insulin (HCC)    -  Primary   Relevant Medications   valsartan -hydrochlorothiazide  (DIOVAN -HCT) 320-25 MG tablet   Other Relevant Orders   POCT HgB A1C (Completed)   CMP14+EGFR   Uric acid   Urine Microalbumin w/creat. ratio     Essential hypertension       Relevant Medications   amLODipine  (NORVASC ) 10 MG tablet   valsartan -hydrochlorothiazide  (DIOVAN -HCT) 320-25 MG tablet     Essential (primary) hypertension       Relevant Medications   amLODipine  (NORVASC ) 10 MG tablet   valsartan -hydrochlorothiazide  (DIOVAN -HCT) 320-25 MG tablet     Gout involving toe, unspecified cause, unspecified chronicity, unspecified laterality       Relevant Orders   Uric acid       I did  encourage her to consider getting the RSV vaccine she would need to get that done at her local pharmacy.  Additional handout and information provided.  Meds ordered this encounter  Medications   amLODipine  (NORVASC ) 10 MG tablet    Sig: Take 1 tablet (10 mg total) by mouth daily.    Dispense:  90 tablet    Refill:  3   valsartan -hydrochlorothiazide  (DIOVAN -HCT) 320-25 MG tablet    Sig: Take 1 tablet by mouth daily.    Dispense:  90 tablet    Refill:  3    Return in about 6 months (around 02/04/2024) for Hypertension, Pre-diabetes.  Duaine German, MD

## 2023-08-05 NOTE — Assessment & Plan Note (Signed)
 Well controlled. Continue current regimen. Follow up in  6 mo

## 2023-08-05 NOTE — Patient Instructions (Signed)
 You to consider getting the RSV vaccine.

## 2023-08-05 NOTE — Assessment & Plan Note (Signed)
 A1c looks great today at 5.8 not currently on medications she is on an ARB and a statin.  Continue to work on healthy diet and regular exercise.

## 2023-08-05 NOTE — Assessment & Plan Note (Signed)
Continue to follow renal function every 6 months. 

## 2023-08-06 ENCOUNTER — Encounter: Payer: Self-pay | Admitting: Family Medicine

## 2023-08-06 ENCOUNTER — Ambulatory Visit: Payer: Self-pay | Admitting: Family Medicine

## 2023-08-06 LAB — CMP14+EGFR
ALT: 22 IU/L (ref 0–32)
AST: 33 IU/L (ref 0–40)
Albumin: 4.6 g/dL (ref 3.8–4.8)
Alkaline Phosphatase: 79 IU/L (ref 44–121)
BUN/Creatinine Ratio: 28 (ref 12–28)
BUN: 30 mg/dL — ABNORMAL HIGH (ref 8–27)
Bilirubin Total: 0.5 mg/dL (ref 0.0–1.2)
CO2: 24 mmol/L (ref 20–29)
Calcium: 10 mg/dL (ref 8.7–10.3)
Chloride: 95 mmol/L — ABNORMAL LOW (ref 96–106)
Creatinine, Ser: 1.07 mg/dL — ABNORMAL HIGH (ref 0.57–1.00)
Globulin, Total: 2.7 g/dL (ref 1.5–4.5)
Glucose: 105 mg/dL — ABNORMAL HIGH (ref 70–99)
Potassium: 3.9 mmol/L (ref 3.5–5.2)
Sodium: 136 mmol/L (ref 134–144)
Total Protein: 7.3 g/dL (ref 6.0–8.5)
eGFR: 53 mL/min/{1.73_m2} — ABNORMAL LOW (ref >59)

## 2023-08-06 LAB — MICROALBUMIN / CREATININE URINE RATIO
Creatinine, Urine: 118.4 mg/dL
Microalb/Creat Ratio: 3 mg/g{creat} (ref 0–29)
Microalbumin, Urine: 3.5 ug/mL

## 2023-08-06 LAB — SPECIMEN STATUS REPORT

## 2023-08-06 LAB — URIC ACID: Uric Acid: 8.3 mg/dL — ABNORMAL HIGH (ref 3.1–7.9)

## 2023-08-06 NOTE — Progress Notes (Signed)
 Hi [name], you look a little bit dry on your labs just make sure you are hydrating well.  Kidney function was stable.  Liver enzymes look good.  Your uric acid level was up to 8.3 goal is less than 6 so just continue to work on a low purine diet and let me know if you are having flares.  Also would like to get you scheduled for a telephone visit with Shelvy Dickens for your Medicare wellness.  Just let us  know if we can do that I think you are due.

## 2023-08-09 NOTE — Progress Notes (Signed)
 Haskell Linker, great news!  The urine shows no excess protein like it did 2 years ago.  That is great.

## 2023-10-18 NOTE — Progress Notes (Signed)
 Pt attended 07/20/2023 screening event with BP of 148/71 and blood sugar was 170. Pt noted at event that she does have a PCP. At event pt did not indicate any SDOH needs. Pt also noted that she is not a smoker and listed Medicare as her insurance at the event.   Per initial f/u [facility] sent pt's PCP a curtesy in-basket message.  Per chart review pt does have a PCP Kayleen D. Metheney;[facility]), insurance, and is not a smoker. Pt's last appt with PCP was 08/05/2023 and has an upcoming appt on 02/04/2024 Pt does not indicate any SDOH needs at this time.  No additional pt f/u to be scheduled at this time per health equity protocol.

## 2023-10-29 ENCOUNTER — Encounter: Payer: Self-pay | Admitting: Sports Medicine

## 2023-12-26 ENCOUNTER — Ambulatory Visit (INDEPENDENT_AMBULATORY_CARE_PROVIDER_SITE_OTHER)

## 2023-12-26 VITALS — Temp 98.2°F

## 2023-12-26 DIAGNOSIS — Z23 Encounter for immunization: Secondary | ICD-10-CM

## 2024-02-04 ENCOUNTER — Ambulatory Visit: Admitting: Family Medicine

## 2024-02-04 DIAGNOSIS — E785 Hyperlipidemia, unspecified: Secondary | ICD-10-CM

## 2024-02-04 DIAGNOSIS — I10 Essential (primary) hypertension: Secondary | ICD-10-CM

## 2024-02-04 DIAGNOSIS — E118 Type 2 diabetes mellitus with unspecified complications: Secondary | ICD-10-CM

## 2024-02-04 DIAGNOSIS — E1122 Type 2 diabetes mellitus with diabetic chronic kidney disease: Secondary | ICD-10-CM

## 2024-02-14 ENCOUNTER — Other Ambulatory Visit: Payer: Self-pay | Admitting: Family Medicine

## 2024-02-14 DIAGNOSIS — E785 Hyperlipidemia, unspecified: Secondary | ICD-10-CM

## 2024-02-24 ENCOUNTER — Ambulatory Visit: Admitting: Family Medicine

## 2024-02-25 ENCOUNTER — Other Ambulatory Visit: Payer: Self-pay | Admitting: Family Medicine

## 2024-02-25 DIAGNOSIS — Z1231 Encounter for screening mammogram for malignant neoplasm of breast: Secondary | ICD-10-CM

## 2024-03-04 ENCOUNTER — Encounter: Payer: Self-pay | Admitting: Family Medicine

## 2024-03-04 ENCOUNTER — Ambulatory Visit (INDEPENDENT_AMBULATORY_CARE_PROVIDER_SITE_OTHER): Admitting: Family Medicine

## 2024-03-04 ENCOUNTER — Ambulatory Visit (INDEPENDENT_AMBULATORY_CARE_PROVIDER_SITE_OTHER)

## 2024-03-04 VITALS — BP 130/59 | HR 75 | Ht 69.0 in | Wt 217.0 lb

## 2024-03-04 VITALS — BP 130/59 | HR 75 | Ht 69.0 in | Wt 217.4 lb

## 2024-03-04 DIAGNOSIS — I10 Essential (primary) hypertension: Secondary | ICD-10-CM | POA: Diagnosis not present

## 2024-03-04 DIAGNOSIS — E1122 Type 2 diabetes mellitus with diabetic chronic kidney disease: Secondary | ICD-10-CM | POA: Diagnosis not present

## 2024-03-04 DIAGNOSIS — N1832 Chronic kidney disease, stage 3b: Secondary | ICD-10-CM

## 2024-03-04 DIAGNOSIS — Z0000 Encounter for general adult medical examination without abnormal findings: Secondary | ICD-10-CM | POA: Diagnosis not present

## 2024-03-04 DIAGNOSIS — N183 Chronic kidney disease, stage 3 unspecified: Secondary | ICD-10-CM

## 2024-03-04 DIAGNOSIS — I129 Hypertensive chronic kidney disease with stage 1 through stage 4 chronic kidney disease, or unspecified chronic kidney disease: Secondary | ICD-10-CM | POA: Diagnosis not present

## 2024-03-04 DIAGNOSIS — M79671 Pain in right foot: Secondary | ICD-10-CM

## 2024-03-04 LAB — POCT GLYCOSYLATED HEMOGLOBIN (HGB A1C): Hemoglobin A1C: 5.9 % — AB (ref 4.0–5.6)

## 2024-03-04 NOTE — Assessment & Plan Note (Signed)
 BP ok today. Recheck in 6 months.

## 2024-03-04 NOTE — Assessment & Plan Note (Signed)
 Due for updated renal function Continue ARB.

## 2024-03-04 NOTE — Progress Notes (Signed)
 "  Chief Complaint  Patient presents with   Medicare Wellness     Subjective:   [name] is a 79 y.o. female who presents for a Medicare Annual Wellness Visit.  Visit info / Clinical Intake: Medicare Wellness Visit Type:: Subsequent Annual Wellness Visit Persons participating in visit and providing information:: patient Medicare Wellness Visit Mode:: In-person (required for WTM) Interpreter Needed?: No Pre-visit prep was completed: yes AWV questionnaire completed by patient prior to visit?: no Living arrangements:: lives with spouse/significant other Patient's Overall Health Status Rating: good Typical amount of pain: none Does pain affect daily life?: no Are you currently prescribed opioids?: no  Dietary Habits and Nutritional Risks How many meals a day?: 2 Eats fruit and vegetables daily?: (!) no Most meals are obtained by: preparing own meals In the last 2 weeks, have you had any of the following?: none Diabetic:: no  Functional Status Activities of Daily Living (to include ambulation/medication): Independent Ambulation: Independent Medication Administration: Independent Home Management (perform basic housework or laundry): Independent Manage your own finances?: yes Primary transportation is: driving Concerns about vision?: (!) yes Concerns about hearing?: no  Fall Screening Falls in the past year?: 0 Number of falls in past year: 0 Was there an injury with Fall?: 0 Fall Risk Category Calculator: 0 Patient Fall Risk Level: Low Fall Risk  Fall Risk Patient at Risk for Falls Due to: No Fall Risks Fall risk Follow up: Falls evaluation completed  Home and Transportation Safety: All rugs have non-skid backing?: yes All stairs or steps have railings?: yes Grab bars in the bathtub or shower?: yes Have non-skid surface in bathtub or shower?: yes Good home lighting?: yes Regular seat belt use?: yes Hospital stays in the last year:: no  Cognitive  Assessment Difficulty concentrating, remembering, or making decisions? : no Will 6CIT or Mini Cog be Completed: yes What year is it?: 0 points What month is it?: 0 points Give patient an address phrase to remember (5 components): [address] [place], [place] About what time is it?: 0 points Count backwards from 20 to 1: 0 points Say the months of the year in reverse: 0 points Repeat the address phrase from earlier: 0 points 6 CIT Score: 0 points  Advance Directives (For Healthcare) Does Patient Have a Medical Advance Directive?: No Would patient like information on creating a medical advance directive?: No - Patient declined  Reviewed/Updated  Reviewed/Updated: Medical History; Surgical History; Medications; Allergies; Care Teams; Patient Goals    Allergies (verified) Allopurinol , Penicillins, Hycodan [hydrocodone  bit-homatrop mbr], and Nsaids   Current Medications (verified) Outpatient Encounter Medications as of 03/04/2024  Medication Sig   AMBULATORY NON FORMULARY MEDICATION Take 1 capsule by mouth daily. Medication Name: Oil of Oregano 60 mg   amLODipine  (NORVASC ) 10 MG tablet Take 1 tablet (10 mg total) by mouth daily.   Calcium  Carb-Cholecalciferol (CALCIUM  600 + D PO) Take 1 tablet by mouth daily.   Cholecalciferol (D3 5000 PO) Take 1 tablet by mouth daily.   Ferrous Gluconate (IRON 27 PO) Take 1 tablet by mouth daily.   Magnesium 250 MG TABS Take 1 tablet by mouth daily.   METAMUCIL FIBER PO Take 1 Scoop by mouth daily.   Multiple Vitamins-Minerals (CENTRUM SILVER 50+WOMEN) TABS Take 1 tablet by mouth daily.   rosuvastatin  (CRESTOR ) 10 MG tablet TAKE 1 TABLET BY MOUTH EVERY DAY   TURMERIC PO Take 1,000 mg by mouth daily.   valsartan -hydrochlorothiazide  (DIOVAN -HCT) 320-25 MG tablet Take 1 tablet by mouth  daily.   No facility-administered encounter medications on file as of 03/04/2024.    History: Past Medical History:  Diagnosis Date   Diverticulosis    Hiatal  hernia    Hyperlipidemia    borderline   Hypertension    Spinal stenosis 2019   Past Surgical History:  Procedure Laterality Date   ABDOMINAL HYSTERECTOMY     TAH Pt. unsure if BSO   BREAST REDUCTION SURGERY  1994   BREAST SURGERY     Reduction   LIPOSUCTION     REDUCTION MAMMAPLASTY Bilateral 1994   Family History  Problem Relation Age of Onset   Breast cancer Sister 29       COD breast cancer and pneumonia   Hypertension Sister    Heart attack Father    Hypertension Father    Hypertension Mother    Kidney disease Mother    Breast cancer Other        ages 9 and 51--2 nieces   Hypertension Brother    Colon cancer Neg Hx    Esophageal cancer Neg Hx    Rectal cancer Neg Hx    Stomach cancer Neg Hx    Social History   Occupational History   Occupation: retired    Comment: land of engineer, building services foreclosures  Tobacco Use   Smoking status: Never    Passive exposure: Current   Smokeless tobacco: Never  Vaping Use   Vaping status: Never Used  Substance and Sexual Activity   Alcohol use: No    Alcohol/week: 0.0 standard drinks of alcohol   Drug use: No   Sexual activity: Not Currently    Partners: Male    Birth control/protection: Surgical    Comment: HYST-1st intercourse 18 yo-5 partners   Tobacco Counseling Counseling given: Not Answered  SDOH Screenings   Food Insecurity: No Food Insecurity (03/04/2024)  Housing: Low Risk (03/04/2024)  Transportation Needs: No Transportation Needs (03/04/2024)  Utilities: Not At Risk (03/04/2024)  Alcohol Screen: Low Risk (04/05/2021)  Depression (PHQ2-9): Low Risk (03/04/2024)  Financial Resource Strain: Low Risk (04/05/2021)  Physical Activity: Inactive (03/04/2024)  Social Connections: Socially Integrated (03/04/2024)  Stress: No Stress Concern Present (03/04/2024)  Tobacco Use: Medium Risk (03/04/2024)  Health Literacy: Adequate Health Literacy (03/04/2024)   See flowsheets for full screening details  Depression Screen PHQ 2 & 9 Depression  Scale- Over the past 2 weeks, how often have you been bothered by any of the following problems? Little interest or pleasure in doing things: 0 Feeling down, depressed, or hopeless (PHQ Adolescent also includes...irritable): 0 PHQ-2 Total Score: 0     Goals Addressed             This Visit's Progress    Patient Stated       Patient states she would like to lose weight and exercise more.              Objective:    Today's Vitals   03/04/24 0843  BP: (!) 130/59  Pulse: 75  SpO2: 97%  Weight: 217 lb (98.4 kg)  Height: 5' 9 (1.753 m)   Body mass index is 32.05 kg/m.  Hearing/Vision screen No results found. Immunizations and Health Maintenance Health Maintenance  Topic Date Due   OPHTHALMOLOGY EXAM  04/04/2023   COVID-19 Vaccine (6 - 2025-26 season) 10/28/2023   Diabetic kidney evaluation - eGFR measurement  08/04/2024   Diabetic kidney evaluation - Urine ACR  08/04/2024   HEMOGLOBIN A1C  09/01/2024   FOOT EXAM  03/04/2025   Medicare Annual Wellness (AWV)  03/04/2025   Bone Density Scan  01/01/2028   DTaP/Tdap/Td (4 - Td or Tdap) 11/24/2028   Pneumococcal Vaccine: 50+ Years  Completed   Influenza Vaccine  Completed   Hepatitis C Screening  Completed   Zoster Vaccines- Shingrix  Completed   Meningococcal B Vaccine  Aged Out   Mammogram  Discontinued   Colonoscopy  Discontinued        Assessment/Plan:  This is a routine wellness examination for [place].  Patient Care Team: Alvan Dorothyann BIRCH, MD as PCP - General Neysa Inocente PARAS, NP (Inactive) as Nurse Practitioner (Obstetrics and Gynecology)  I have personally reviewed and noted the following in the patients chart:   Medical and social history Use of alcohol, tobacco or illicit drugs  Current medications and supplements including opioid prescriptions. Functional ability and status Nutritional status Physical activity Advanced directives List of other physicians Hospitalizations, surgeries, and ER  visits in previous 12 months Vitals Screenings to include cognitive, depression, and falls Referrals and appointments  No orders of the defined types were placed in this encounter.  In addition, I have reviewed and discussed with patient certain preventive protocols, quality metrics, and best practice recommendations. A written personalized care plan for preventive services as well as general preventive health recommendations were provided to patient.   Bonny Jon Mayor, CMA   03/04/2024   Return in 1 year (on 03/04/2025).  After Visit Summary: (In Person-Declined) Patient declined AVS at this time.  Nurse Notes:   [name] is a 79 y.o. female patient of Metheney, Dorothyann BIRCH, MD who had a Medicare Annual Wellness Visit today. Vadis is Retired and lives with their spouse. She has 0 children. She reports that she is socially active and does interact with friends/family regularly. She is minimally physically active and enjoys volunteering and doing church activities.     "

## 2024-03-04 NOTE — Progress Notes (Signed)
 "  Established Patient Office Visit  Patient ID: [name], female    DOB: [birth year]  Age: 79 y.o. MRN: [id number] PCP: Alvan Dorothyann BIRCH, MD  Chief Complaint  Patient presents with   Hypertension   Diabetes    Subjective:     HPI  Discussed the use of AI scribe software for clinical note transcription with the patient, who gave verbal consent to proceed.  History of Present Illness [name] is a 79 year old female with gout who presents with right foot swelling and pain.  Right foot swelling and pain - Persistent swelling and pain localized to the top of the right foot - History of gout affecting the right foot - Uncertain about need for podiatry evaluation - Previous foot X-rays performed; no further details available  Glycemic control - Hemoglobin A1c currently 5.9, increased from 5.8 previously  Cardiopulmonary symptoms - No chest pain - No shortness of breath - No unusual symptoms - Occasional blood pressure monitoring without concerns - No unusual headaches  Preventive health maintenance - Mammogram scheduled for January 28th - Awaiting confirmation of last eye exam, believed to be in February of previous year - Very small cataract identified at last eye exam  Physical activity - No exercise this year - Plans to initiate stretching or short walks soon  Taking some supplements and brought list in for use today     ROS    Objective:     BP (!) 130/59   Pulse 75   Ht 5' 9 (1.753 m)   Wt 217 lb 6.4 oz (98.6 kg)   SpO2 97%   BMI 32.10 kg/m     Physical Exam Vitals and nursing note reviewed.  Constitutional:      Appearance: Normal appearance.  HENT:     Head: Normocephalic and atraumatic.  Eyes:     Conjunctiva/sclera: Conjunctivae normal.  Cardiovascular:     Rate and Rhythm: Normal rate and regular rhythm.  Pulmonary:     Effort: Pulmonary effort is normal.     Breath sounds: Normal breath sounds.  Skin:     General: Skin is warm and dry.  Neurological:     Mental Status: She is alert.  Psychiatric:        Mood and Affect: Mood normal.      Results for orders placed or performed in visit on 03/04/24  POCT HgB A1C  Result Value Ref Range   Hemoglobin A1C 5.9 (A) 4.0 - 5.6 %   HbA1c POC (<> result, manual entry)     HbA1c, POC (prediabetic range)     HbA1c, POC (controlled diabetic range)         The ASCVD Risk score (Arnett DK, et al., 2019) failed to calculate for the following reasons:   The valid total cholesterol range is 130 to 320 mg/dL    Assessment & Plan:   Problem List Items Addressed This Visit       Cardiovascular and Mediastinum   HYPERTENSION, BENIGN - Primary   BP ok today. Recheck in 6 months.       Relevant Orders   CMP14+EGFR   Lipid panel   CBC     Endocrine   CKD stage 3 due to type 2 diabetes mellitus (HCC)   Relevant Orders   POCT HgB A1C (Completed)   CMP14+EGFR   Lipid panel   CBC     Genitourinary   Chronic kidney disease (CKD) stage G3b/A1, moderately decreased glomerular  filtration rate (GFR) between 30-44 mL/min/1.73 square meter and albuminuria creatinine ratio less than 30 mg/g (HCC)   Due for updated renal function Continue ARB.        Relevant Orders   CMP14+EGFR   Lipid panel   CBC   Other Visit Diagnoses       Right foot pain       Relevant Orders   Ambulatory referral to Podiatry       Assessment and Plan Assessment & Plan Type 2 diabetes mellitus with stage 3 chronic kidney disease A1c at 5.9, slightly above normal but improved from 5.8. No acute issues. - Continue current diabetes management plan. - Monitor A1c every six months.  Essential hypertension No symptoms reported. - Encouraged home blood pressure monitoring if symptoms develop.  Right foot swelling and pain Swelling and pain in right foot, previously evaluated with x-rays. Symptoms do not resemble gout. - Referred to podiatrist for further  evaluation and management.  Cataract Small cataract noted. No immediate need for surgery. Discussed potential impact on vision. - Ensure annual eye exams to monitor cataract progression.  General health maintenance Mammogram scheduled. Eye exam status unclear. Discussed importance of yearly eye exams due to diabetes. - Will verify receipt of last year's eye exam report from [facility]. - Encouraged starting an exercise routine, even with simple activities like stretching or walking.    Return in about 6 months (around 09/01/2024) for DM/ckd/bp.    Dorothyann Byars, MD [facility]   "

## 2024-03-04 NOTE — Patient Instructions (Signed)
 [name],  Thank you for taking the time for your Medicare Wellness Visit. I appreciate your continued commitment to your health goals. Please review the care plan we discussed, and feel free to reach out if I can assist you further.  Please note that Annual Wellness Visits do not include a physical exam. Some assessments may be limited, especially if the visit was conducted virtually. If needed, we may recommend an in-person follow-up with your provider.  Ongoing Care Seeing your primary care provider every 3 to 6 months helps us  monitor your health and provide consistent, personalized care.   Referrals If a referral was made during today's visit and you haven't received any updates within two weeks, please contact the referred provider directly to check on the status.  Recommended Screenings:  Health Maintenance  Topic Date Due   Medicare Annual Wellness Visit  04/05/2022   Eye exam for diabetics  04/04/2023   COVID-19 Vaccine (6 - 2025-26 season) 10/28/2023   Yearly kidney function blood test for diabetes  08/04/2024   Yearly kidney health urinalysis for diabetes  08/04/2024   Hemoglobin A1C  09/01/2024   Complete foot exam   03/04/2025   Osteoporosis screening with Bone Density Scan  01/01/2028   DTaP/Tdap/Td vaccine (4 - Td or Tdap) 11/24/2028   Pneumococcal Vaccine for age over 40  Completed   Flu Shot  Completed   Hepatitis C Screening  Completed   Zoster (Shingles) Vaccine  Completed   Meningitis B Vaccine  Aged Out   Breast Cancer Screening  Discontinued   Colon Cancer Screening  Discontinued       03/04/2024    8:45 AM  Advanced Directives  Does Patient Have a Medical Advance Directive? No  Would patient like information on creating a medical advance directive? No - Patient declined    Vision: Annual vision screenings are recommended for early detection of glaucoma, cataracts, and diabetic retinopathy. These exams can also reveal signs of chronic conditions such  as diabetes and high blood pressure.  Dental: Annual dental screenings help detect early signs of oral cancer, gum disease, and other conditions linked to overall health, including heart disease and diabetes.  Please see the attached documents for additional preventive care recommendations.

## 2024-03-05 ENCOUNTER — Encounter: Payer: Self-pay | Admitting: Family Medicine

## 2024-03-05 ENCOUNTER — Ambulatory Visit: Payer: Self-pay | Admitting: Family Medicine

## 2024-03-05 LAB — LIPID PANEL
Chol/HDL Ratio: 2.4 ratio (ref 0.0–4.4)
Cholesterol, Total: 113 mg/dL (ref 100–199)
HDL: 47 mg/dL
LDL Chol Calc (NIH): 45 mg/dL (ref 0–99)
Triglycerides: 114 mg/dL (ref 0–149)
VLDL Cholesterol Cal: 21 mg/dL (ref 5–40)

## 2024-03-05 LAB — CMP14+EGFR
ALT: 19 IU/L (ref 0–32)
AST: 28 IU/L (ref 0–40)
Albumin: 4.7 g/dL (ref 3.8–4.8)
Alkaline Phosphatase: 73 IU/L (ref 49–135)
BUN/Creatinine Ratio: 19 (ref 12–28)
BUN: 20 mg/dL (ref 8–27)
Bilirubin Total: 0.6 mg/dL (ref 0.0–1.2)
CO2: 25 mmol/L (ref 20–29)
Calcium: 9.8 mg/dL (ref 8.7–10.3)
Chloride: 98 mmol/L (ref 96–106)
Creatinine, Ser: 1.08 mg/dL — ABNORMAL HIGH (ref 0.57–1.00)
Globulin, Total: 2.5 g/dL (ref 1.5–4.5)
Glucose: 125 mg/dL — ABNORMAL HIGH (ref 70–99)
Potassium: 3.9 mmol/L (ref 3.5–5.2)
Sodium: 140 mmol/L (ref 134–144)
Total Protein: 7.2 g/dL (ref 6.0–8.5)
eGFR: 53 mL/min/1.73 — ABNORMAL LOW

## 2024-03-05 LAB — CBC
Hematocrit: 36.6 % (ref 34.0–46.6)
Hemoglobin: 11.4 g/dL (ref 11.1–15.9)
MCH: 27 pg (ref 26.6–33.0)
MCHC: 31.1 g/dL — ABNORMAL LOW (ref 31.5–35.7)
MCV: 87 fL (ref 79–97)
Platelets: 246 x10E3/uL (ref 150–450)
RBC: 4.23 x10E6/uL (ref 3.77–5.28)
RDW: 14 % (ref 11.7–15.4)
WBC: 4.3 x10E3/uL (ref 3.4–10.8)

## 2024-03-05 NOTE — Progress Notes (Signed)
 Hi [name], renal function is stable.  Liver enzymes are normal.  Hemoglobin looks great this time.  And cholesterol looks good as well.

## 2024-03-25 ENCOUNTER — Ambulatory Visit

## 2024-04-01 ENCOUNTER — Encounter: Payer: Medicare Other | Admitting: Nurse Practitioner

## 2024-09-02 ENCOUNTER — Ambulatory Visit: Admitting: Family Medicine

## 2025-03-09 ENCOUNTER — Ambulatory Visit
